# Patient Record
Sex: Male | Born: 1955 | Race: White | Hispanic: No | Smoking: Current every day smoker
Health system: Southern US, Community
[De-identification: ages and names within clinical notes are randomized; demographics above are authoritative.]

## PROBLEM LIST (undated history)

## (undated) DIAGNOSIS — I10 Essential (primary) hypertension: Secondary | ICD-10-CM

## (undated) DIAGNOSIS — M1990 Unspecified osteoarthritis, unspecified site: Secondary | ICD-10-CM

## (undated) DIAGNOSIS — F419 Anxiety disorder, unspecified: Secondary | ICD-10-CM

## (undated) DIAGNOSIS — K458 Other specified abdominal hernia without obstruction or gangrene: Secondary | ICD-10-CM

## (undated) DIAGNOSIS — M47816 Spondylosis without myelopathy or radiculopathy, lumbar region: Secondary | ICD-10-CM

## (undated) DIAGNOSIS — Z87442 Personal history of urinary calculi: Secondary | ICD-10-CM

## (undated) DIAGNOSIS — N400 Enlarged prostate without lower urinary tract symptoms: Secondary | ICD-10-CM

## (undated) HISTORY — PX: BACK SURGERY: SHX140

## (undated) HISTORY — PX: SHOULDER SURGERY: SHX246

## (undated) HISTORY — PX: EYE SURGERY: SHX253

---

## 2002-07-30 ENCOUNTER — Emergency Department (HOSPITAL_COMMUNITY): Admission: EM | Admit: 2002-07-30 | Discharge: 2002-07-31 | Payer: Self-pay

## 2002-08-07 ENCOUNTER — Emergency Department (HOSPITAL_COMMUNITY): Admission: EM | Admit: 2002-08-07 | Discharge: 2002-08-07 | Payer: Self-pay | Admitting: Emergency Medicine

## 2002-08-24 ENCOUNTER — Encounter: Payer: Self-pay | Admitting: Emergency Medicine

## 2002-08-24 ENCOUNTER — Emergency Department (HOSPITAL_COMMUNITY): Admission: EM | Admit: 2002-08-24 | Discharge: 2002-08-24 | Payer: Self-pay | Admitting: Emergency Medicine

## 2002-10-26 ENCOUNTER — Emergency Department (HOSPITAL_COMMUNITY): Admission: EM | Admit: 2002-10-26 | Discharge: 2002-10-26 | Payer: Self-pay | Admitting: Emergency Medicine

## 2003-09-12 ENCOUNTER — Emergency Department (HOSPITAL_COMMUNITY): Admission: EM | Admit: 2003-09-12 | Discharge: 2003-09-12 | Payer: Self-pay | Admitting: Orthopaedic Surgery

## 2005-01-22 ENCOUNTER — Emergency Department (HOSPITAL_COMMUNITY): Admission: EM | Admit: 2005-01-22 | Discharge: 2005-01-22 | Payer: Self-pay | Admitting: Emergency Medicine

## 2007-01-27 ENCOUNTER — Emergency Department (HOSPITAL_COMMUNITY): Admission: EM | Admit: 2007-01-27 | Discharge: 2007-01-27 | Payer: Self-pay | Admitting: Emergency Medicine

## 2007-02-04 ENCOUNTER — Ambulatory Visit (HOSPITAL_COMMUNITY): Admission: RE | Admit: 2007-02-04 | Discharge: 2007-02-04 | Payer: Self-pay | Admitting: Urology

## 2007-02-04 ENCOUNTER — Emergency Department (HOSPITAL_COMMUNITY): Admission: EM | Admit: 2007-02-04 | Discharge: 2007-02-05 | Payer: Self-pay | Admitting: Emergency Medicine

## 2007-02-08 ENCOUNTER — Emergency Department (HOSPITAL_COMMUNITY): Admission: EM | Admit: 2007-02-08 | Discharge: 2007-02-08 | Payer: Self-pay | Admitting: Emergency Medicine

## 2008-03-13 ENCOUNTER — Emergency Department (HOSPITAL_COMMUNITY): Admission: EM | Admit: 2008-03-13 | Discharge: 2008-03-13 | Payer: Self-pay | Admitting: Emergency Medicine

## 2008-04-01 ENCOUNTER — Emergency Department (HOSPITAL_COMMUNITY): Admission: EM | Admit: 2008-04-01 | Discharge: 2008-04-01 | Payer: Self-pay | Admitting: Emergency Medicine

## 2009-02-28 ENCOUNTER — Emergency Department (HOSPITAL_COMMUNITY): Admission: EM | Admit: 2009-02-28 | Discharge: 2009-02-28 | Payer: Self-pay | Admitting: Emergency Medicine

## 2010-04-18 LAB — DIFFERENTIAL
Basophils Absolute: 0.1 K/uL (ref 0.0–0.1)
Basophils Relative: 0 % (ref 0–1)
Eosinophils Absolute: 0 10*3/uL (ref 0.0–0.7)
Eosinophils Absolute: 0.2 10*3/uL (ref 0.0–0.7)
Eosinophils Relative: 0 % (ref 0–5)
Eosinophils Relative: 2 % (ref 0–5)
Lymphocytes Relative: 9 % — ABNORMAL LOW (ref 12–46)
Lymphocytes Relative: 32 % (ref 12–46)
Lymphs Abs: 1.5 10*3/uL (ref 0.7–4.0)
Lymphs Abs: 3.2 10*3/uL (ref 0.7–4.0)
Monocytes Absolute: 1 K/uL (ref 0.1–1.0)
Monocytes Absolute: 1.2 10*3/uL — ABNORMAL HIGH (ref 0.1–1.0)
Monocytes Relative: 6 % (ref 3–12)
Neutro Abs: 14.8 10*3/uL — ABNORMAL HIGH (ref 1.7–7.7)
Neutrophils Relative %: 85 % — ABNORMAL HIGH (ref 43–77)

## 2010-04-18 LAB — BASIC METABOLIC PANEL
CO2: 22 mEq/L (ref 19–32)
Chloride: 105 mEq/L (ref 96–112)
GFR calc Af Amer: >60 mL/min (ref >60)
Glucose, Bld: 109 mg/dL — ABNORMAL HIGH (ref 70–99)
Potassium: 3.9 mEq/L (ref 3.5–5.1)
Sodium: 134 mEq/L — ABNORMAL LOW (ref 135–145)

## 2010-04-18 LAB — URINALYSIS, ROUTINE W REFLEX MICROSCOPIC
Bilirubin Urine: NEGATIVE
Bilirubin Urine: NEGATIVE
Glucose, UA: NEGATIVE mg/dL
Glucose, UA: NEGATIVE mg/dL
Hgb urine dipstick: NEGATIVE
Ketones, ur: NEGATIVE mg/dL
Leukocytes, UA: NEGATIVE
Nitrite: NEGATIVE
Protein, ur: NEGATIVE mg/dL
Specific Gravity, Urine: 1.024 (ref 1.005–1.030)
Specific Gravity, Urine: 1.023 (ref 1.005–1.030)
Urobilinogen, UA: 0.2 mg/dL (ref 0.0–1.0)
Urobilinogen, UA: 1 mg/dL (ref 0.0–1.0)
pH: 5.5 (ref 5.0–8.0)
pH: 6 (ref 5.0–8.0)

## 2010-04-18 LAB — CBC
HCT: 41.7 % (ref 39.0–52.0)
HCT: 40 % (ref 39.0–52.0)
Hemoglobin: 14.3 g/dL (ref 13.0–17.0)
Hemoglobin: 13.6 g/dL (ref 13.0–17.0)
MCHC: 34.3 g/dL (ref 30.0–36.0)
MCV: 90.9 fL (ref 78.0–100.0)
MCV: 92.1 fL (ref 78.0–100.0)
Platelets: 259 10*3/uL (ref 150–400)
Platelets: 269 10*3/uL (ref 150–400)
RBC: 4.58 MIL/uL (ref 4.22–5.81)
RDW: 13.4 % (ref 11.5–15.5)
RDW: 13.1 % (ref 11.5–15.5)
WBC: 17.5 10*3/uL — ABNORMAL HIGH (ref 4.0–10.5)

## 2010-04-18 LAB — POCT I-STAT, CHEM 8
BUN: 9 mg/dL (ref 6–23)
Calcium, Ion: 1.15 mmol/L (ref 1.12–1.32)
Creatinine, Ser: 1 mg/dL (ref 0.4–1.5)
Hemoglobin: 13.9 g/dL (ref 13.0–17.0)
TCO2: 26 mmol/L (ref 0–100)

## 2010-04-18 LAB — URINE MICROSCOPIC-ADD ON

## 2010-04-18 LAB — LIPASE, BLOOD: Lipase: 34 U/L (ref 11–59)

## 2010-04-18 LAB — HEPATIC FUNCTION PANEL
AST: 25 U/L (ref 0–37)
Albumin: 3.8 g/dL (ref 3.5–5.2)
Alkaline Phosphatase: 67 U/L (ref 39–117)
Total Bilirubin: 0.8 mg/dL (ref 0.3–1.2)
Total Protein: 6.5 g/dL (ref 6.0–8.3)

## 2010-04-19 ENCOUNTER — Telehealth: Payer: Self-pay | Admitting: Cardiovascular Disease

## 2010-04-19 NOTE — Telephone Encounter (Signed)
Pt needs his triplopics order he gets it through the patient assistance program

## 2010-04-19 NOTE — Telephone Encounter (Signed)
This message was sent to me on the wrong patient. Please disregard.

## 2010-05-13 ENCOUNTER — Other Ambulatory Visit (HOSPITAL_COMMUNITY): Payer: Self-pay | Admitting: Orthopedic Surgery

## 2010-05-13 DIAGNOSIS — M4300 Spondylolysis, site unspecified: Secondary | ICD-10-CM

## 2010-05-13 DIAGNOSIS — M4807 Spinal stenosis, lumbosacral region: Secondary | ICD-10-CM

## 2010-05-16 ENCOUNTER — Ambulatory Visit (HOSPITAL_COMMUNITY)
Admission: RE | Admit: 2010-05-16 | Discharge: 2010-05-16 | Disposition: A | Discharge status: Home / Self Care | Payer: Medicaid Other | Source: Ambulatory Visit | Attending: Orthopedic Surgery | Admitting: Orthopedic Surgery

## 2010-05-16 DIAGNOSIS — M4807 Spinal stenosis, lumbosacral region: Secondary | ICD-10-CM

## 2010-05-16 DIAGNOSIS — M47817 Spondylosis without myelopathy or radiculopathy, lumbosacral region: Secondary | ICD-10-CM | POA: Insufficient documentation

## 2010-05-16 DIAGNOSIS — M5126 Other intervertebral disc displacement, lumbar region: Secondary | ICD-10-CM | POA: Insufficient documentation

## 2010-05-16 DIAGNOSIS — M51379 Other intervertebral disc degeneration, lumbosacral region without mention of lumbar back pain or lower extremity pain: Secondary | ICD-10-CM | POA: Insufficient documentation

## 2010-05-16 DIAGNOSIS — M519 Unspecified thoracic, thoracolumbar and lumbosacral intervertebral disc disorder: Secondary | ICD-10-CM | POA: Insufficient documentation

## 2010-05-16 DIAGNOSIS — M4300 Spondylolysis, site unspecified: Secondary | ICD-10-CM

## 2010-05-16 DIAGNOSIS — M549 Dorsalgia, unspecified: Secondary | ICD-10-CM | POA: Insufficient documentation

## 2010-05-16 DIAGNOSIS — IMO0002 Reserved for concepts with insufficient information to code with codable children: Secondary | ICD-10-CM | POA: Insufficient documentation

## 2010-05-16 DIAGNOSIS — M5137 Other intervertebral disc degeneration, lumbosacral region: Secondary | ICD-10-CM | POA: Insufficient documentation

## 2010-07-12 ENCOUNTER — Emergency Department (HOSPITAL_COMMUNITY)
Admission: EM | Admit: 2010-07-12 | Discharge: 2010-07-12 | Disposition: A | Discharge status: Home / Self Care | Payer: Medicaid Other | Attending: Emergency Medicine | Admitting: Emergency Medicine

## 2010-07-12 DIAGNOSIS — Z79899 Other long term (current) drug therapy: Secondary | ICD-10-CM | POA: Insufficient documentation

## 2010-07-12 DIAGNOSIS — M4310 Spondylolisthesis, site unspecified: Secondary | ICD-10-CM | POA: Insufficient documentation

## 2010-07-12 DIAGNOSIS — M545 Low back pain, unspecified: Secondary | ICD-10-CM | POA: Insufficient documentation

## 2010-07-22 ENCOUNTER — Ambulatory Visit (HOSPITAL_COMMUNITY)
Admission: RE | Admit: 2010-07-22 | Discharge: 2010-07-22 | Disposition: A | Discharge status: Home / Self Care | Payer: Medicaid Other | Source: Ambulatory Visit | Attending: Neurological Surgery | Admitting: Neurological Surgery

## 2010-07-22 ENCOUNTER — Other Ambulatory Visit (HOSPITAL_COMMUNITY): Payer: Self-pay | Admitting: Neurological Surgery

## 2010-07-22 ENCOUNTER — Encounter (HOSPITAL_COMMUNITY)
Admission: RE | Admit: 2010-07-22 | Discharge: 2010-07-22 | Disposition: A | Discharge status: Home / Self Care | Payer: Medicaid Other | Source: Ambulatory Visit | Attending: Neurological Surgery | Admitting: Neurological Surgery

## 2010-07-22 DIAGNOSIS — I517 Cardiomegaly: Secondary | ICD-10-CM | POA: Insufficient documentation

## 2010-07-22 DIAGNOSIS — Z01812 Encounter for preprocedural laboratory examination: Secondary | ICD-10-CM | POA: Insufficient documentation

## 2010-07-22 DIAGNOSIS — Z01818 Encounter for other preprocedural examination: Secondary | ICD-10-CM

## 2010-07-22 LAB — DIFFERENTIAL
Basophils Absolute: 0.1 10*3/uL (ref 0.0–0.1)
Basophils Relative: 0 % (ref 0–1)
Eosinophils Absolute: 0.3 10*3/uL (ref 0.0–0.7)
Eosinophils Relative: 2 % (ref 0–5)
Monocytes Absolute: 1 10*3/uL (ref 0.1–1.0)
Monocytes Relative: 9 % (ref 3–12)

## 2010-07-22 LAB — SURGICAL PCR SCREEN
MRSA, PCR: NEGATIVE
Staphylococcus aureus: NEGATIVE

## 2010-07-22 LAB — CBC
MCH: 31.6 pg (ref 26.0–34.0)
MCHC: 35.7 g/dL (ref 30.0–36.0)
Platelets: 250 10*3/uL (ref 150–400)
RDW: 12.8 % (ref 11.5–15.5)

## 2010-07-22 LAB — BASIC METABOLIC PANEL
GFR calc Af Amer: >60 mL/min (ref >60)
Potassium: 4.5 mEq/L (ref 3.5–5.1)

## 2010-07-22 LAB — PROTIME-INR: Prothrombin Time: 12.7 seconds (ref 11.6–15.2)

## 2010-07-24 ENCOUNTER — Inpatient Hospital Stay (HOSPITAL_COMMUNITY)
Admission: RE | Admit: 2010-07-24 | Discharge: 2010-07-26 | DRG: 460 | Disposition: A | Discharge status: Home / Self Care | Payer: Medicaid Other | Source: Ambulatory Visit | Attending: Neurological Surgery | Admitting: Neurological Surgery

## 2010-07-24 ENCOUNTER — Inpatient Hospital Stay (HOSPITAL_COMMUNITY): Payer: Medicaid Other

## 2010-07-24 DIAGNOSIS — M47817 Spondylosis without myelopathy or radiculopathy, lumbosacral region: Principal | ICD-10-CM | POA: Diagnosis present

## 2010-07-24 DIAGNOSIS — M5137 Other intervertebral disc degeneration, lumbosacral region: Secondary | ICD-10-CM | POA: Diagnosis present

## 2010-07-24 DIAGNOSIS — I10 Essential (primary) hypertension: Secondary | ICD-10-CM | POA: Diagnosis present

## 2010-07-24 DIAGNOSIS — F17200 Nicotine dependence, unspecified, uncomplicated: Secondary | ICD-10-CM | POA: Diagnosis present

## 2010-07-24 DIAGNOSIS — M51379 Other intervertebral disc degeneration, lumbosacral region without mention of lumbar back pain or lower extremity pain: Secondary | ICD-10-CM | POA: Diagnosis present

## 2010-07-24 LAB — TYPE AND SCREEN: ABO/RH(D): O POS

## 2010-07-24 LAB — ABO/RH: ABO/RH(D): O POS

## 2010-08-02 NOTE — Discharge Summary (Signed)
  NAMERANIER, COACH NO.:  [id number]  MEDICAL RECORD NO.:  [id number]  LOCATION:  3004                         FACILITY:  [facility]  PHYSICIAN:  Tia Alert, MD     DATE OF BIRTH:  1955-07-19  DATE OF ADMISSION:  07/24/2010 DATE OF DISCHARGE:  07/26/2010                              DISCHARGE SUMMARY   ADMISSION DIAGNOSIS:  Lumbar spondylosis with degenerative disk disease and spinal stenosis.  PROCEDURE:  Posterior lumbar interbody fusion, L4-5, L5-S1.  BRIEF HISTORY OF PRESENT ILLNESS:  Samuel Gibson is a 55 year old gentleman who presented with severe back pain, bilateral leg pain.  He had MRI which showed severe spondylosis with degenerative disk disease and stenosis at L4-5 and L5-S1.  I recommended two-level decompression and instrumented fusion in hopes of improving his pain syndrome.  He understood the risks and benefits, expected outcome, and wished to proceed.  HOSPITAL COURSE:  The patient was admitted on July 24, 2010, and taken to the operating room where he underwent a two-level PLIF at L4-5 and L5- S1.  The patient tolerated the procedure well, was taken to the recovery room and then to the floor in stable condition.  For details of the operative procedure, please see the dictated operative note.  The patient's hospital course was routine.  There were no complications.  He had no evidence of significant urinary retention after the first night. He was ambulating well.  He had really minimal pain for this type of procedure.  He had some back soreness, no leg pain.  No numbness, tingling, or weakness.  He had good strength in his lower extremities. He did well with Physical and Occupational Therapy.  His wound remained clean, dry, and intact.  He remained afebrile with stable vital signs and he was discharged home in stable condition on July 26, 2010, and plans to follow up in 2 weeks.  DISCHARGE MEDICATIONS:  He is to continue his home  medications.  He was given prescriptions for Percocet and Flexeril.  Followup is scheduled for 2 weeks.  FINAL DIAGNOSIS:  Posterior lumbar interbody fusion, L4-5, L5-S1.     Tia Alert, MD     DSJ/MEDQ  D:  07/26/2010  T:  07/26/2010  Job:  [id number]  Electronically Signed by Marikay Alar MD on 08/02/2010 11:36:14 AM

## 2010-08-02 NOTE — Op Note (Signed)
NAMEOSRIC, KLOPF NO.:  [id number]  MEDICAL RECORD NO.:  [id number]  LOCATION:  3004                         FACILITY:  [facility]  PHYSICIAN:  Tia Alert, MD     DATE OF BIRTH:  1955/10/29  DATE OF PROCEDURE:  07/24/2010 DATE OF DISCHARGE:                              OPERATIVE REPORT   PREOPERATIVE DIAGNOSES: 1. Degenerative disk disease L4-5, L5-S1. 2. Spinal stenosis L4-5, L5-S1. 3. Back pain. 4. Leg pain.  POSTOPERATIVE DIAGNOSES: 1. Degenerative disk disease L4-5, L5-S1. 2. Spinal stenosis L4-5, L5-S1. 3. Back pain. 4. Leg pain.  PROCEDURES: 1. Decompressive lumbar laminectomy, hemi facetectomy and     foraminotomies at L4-5 and L5-S1 for decompression of the L4, and     the L5, and the S1 nerve roots requiring much more work than is     required of a simple laminectomy for PLIF in order to remove the     stenotic compression of the nerve roots and address his     radiculopathy. 2. Posterior lumbar interbody fusion L5-S1 utilizing PEEK interbody     cages packed with local autograft and Actifuse putty. 3. Intertransverse arthrodesis L4-S1 utilizing Actifuse putty and     local autograft. 4. Segmental fixation L4-S1 utilizing the Globus pedicle screw system.  SURGEON:  Tia Alert, MD  ASSISTANT:  Reinaldo Meeker, MD  ANESTHESIA:  General endotracheal.  COMPLICATIONS:  None apparent.  INDICATIONS FOR PROCEDURE:  Mr. Stairs is a 55 year old gentleman who presented with severe back and leg pain.  He had an MRI which showed significant spondylosis and degenerative disk disease with foraminal stenosis and lateral recess stenosis at L4-5 and L5-S1.  I recommended two-level decompression with instrumented fusion in order to address the spinal stenosis as well as his degenerative disk disease.  He understood the risks, benefits and the expected outcome and wished to proceed.  DESCRIPTION OF PROCEDURE:  The patient was taken to operating  room and after induction of adequate generalized endotracheal anesthesia, he was rolled into the prone position on the chest rolls and all pressure points were padded.  His lumbar region was cleaned with Hibiclens and then prepped with DuraPrep and then draped in usual sterile fashion.  A 10 mL of local anesthesia was injected and a dorsal midline incision was made and carried down to the lumbosacral fascia.  The fascia was opened and the paraspinous musculature was taken down in a subperiosteal fashion to expose L5-5 and L5-S1.  Intraoperative fluoroscopy confirmed my level and I took the dissection out over the facets to expose the transverse processes of L4, L5 and S1.  I used a Kerrison punch and the Leksell rongeur to perform complete laminectomies and hemi facetectomies at L4, L5 and S1.  The underlying yellow ligament was opened and removed.  The L4, the L5, and the S1 nerve roots were identified and decompressed distally into the respective foramina.  This required much more work than is required to expose enough disk for simple PLIF procedure that would not require adequate decompression of the nerve roots and correction of spinal stenosis.  This laminectomy required more work than that  in order to adequately decompress the neural elements to address his spinal stenosis and radicular pain.  Once this was done, I coagulated the epidural venous vasculature, I cut it sharply and incised the disk space.  I incised the disk space bilaterally at L5-S1 and at L4- 5 the exact same diskectomy and disk space preparation was done at both levels, I accepted to use the 8-mm instrument at L5-S1 and 10 mm instrument at L4-5.  I was able to distract the disk space at each level and then used rotating cutters and cutting chisels to prepare the endplates.  The midline was prepared with Epstein curettes and near total diskectomies were completed.  I used PEEK interbody cages, packed with local  autograft and Actifuse putty and tangent interbody bone wedges at each level, and the midline was again packed with local autograft and Actifuse putty.  Once the PLIF was complete, I turned my attention to the segmental fixation.  I localized the pedicle screw entry zones utilizing surface landmarks and lateral fluoroscopy.  I probed each pedicle and the pedicle probe tapped each pedicle with a 5 x 5 tap and placed 6.5 x 50 mm pedicle screws into L4 and L5 and 6.5 x 40 mm pedicle screws into the S1 pedicles bilaterally and I then checked this with AP and lateral fluoroscopy.  I decorticated the transverse processes, placed a mixture of local autograft and Actifuse putty to perform intertransverse arthrodesis.  I then put the lordotic rods into multiaxial screw heads of the pedicle screws and locked these into position with locking caps and antitorque device.  I then irrigated with saline solution containing bacitracin, dried all bleeding points.  He had significant epidural bleeding when I removed the retractor I was able to find this and controlled it, but I spent considerable time drying the surgical bed with bipolar cautery, unipolar cautery, Surgifoam, and Gelfoam.  Once it was completed, I then placed two medium Hemovac drains through separate stab incisions and placed these under the cross-link which was placed for added support.  I then closed the muscle and fascia with 0 Vicryl, closed the subcutaneous and subcuticular tissues with 2-0 and 3-0 Vicryl, and closed the skin with Benzoin Steri-Strips.  The drapes were removed.  Sterile dressing was applied.  The patient awakened from general anesthesia and transferred to recovery room in stable condition.  At the end of procedure, all sponge, needle, and instruments counts were correct.     Tia Alert, MD     DSJ/MEDQ  D:  07/24/2010  T:  07/25/2010  Job:  [id number]  Electronically Signed by Marikay Alar MD on 08/02/2010  11:36:16 AM

## 2010-09-23 ENCOUNTER — Ambulatory Visit
Admission: RE | Admit: 2010-09-23 | Discharge: 2010-09-23 | Disposition: A | Discharge status: Home / Self Care | Payer: Medicaid Other | Source: Ambulatory Visit | Attending: Neurological Surgery | Admitting: Neurological Surgery

## 2010-09-23 ENCOUNTER — Other Ambulatory Visit: Payer: Self-pay | Admitting: Neurological Surgery

## 2010-09-23 DIAGNOSIS — M545 Low back pain: Secondary | ICD-10-CM

## 2010-09-27 LAB — CBC
HCT: 42.1
MCV: 89.4
RBC: 4.71
WBC: 9.9

## 2010-09-27 LAB — URINALYSIS, ROUTINE W REFLEX MICROSCOPIC
Bilirubin Urine: NEGATIVE
Glucose, UA: NEGATIVE
Glucose, UA: NEGATIVE
Ketones, ur: NEGATIVE
Leukocytes, UA: NEGATIVE
Nitrite: NEGATIVE
pH: 5.5
pH: 6.5

## 2010-09-27 LAB — BASIC METABOLIC PANEL
Chloride: 108
GFR calc Af Amer: >60
Potassium: 3.9
Sodium: 139

## 2010-09-27 LAB — URINE MICROSCOPIC-ADD ON

## 2010-10-11 ENCOUNTER — Encounter (INDEPENDENT_AMBULATORY_CARE_PROVIDER_SITE_OTHER): Payer: Medicaid Other | Admitting: Ophthalmology

## 2010-10-11 DIAGNOSIS — H35039 Hypertensive retinopathy, unspecified eye: Secondary | ICD-10-CM

## 2010-10-11 DIAGNOSIS — H43819 Vitreous degeneration, unspecified eye: Secondary | ICD-10-CM

## 2010-10-11 DIAGNOSIS — H2510 Age-related nuclear cataract, unspecified eye: Secondary | ICD-10-CM

## 2011-10-13 ENCOUNTER — Encounter (INDEPENDENT_AMBULATORY_CARE_PROVIDER_SITE_OTHER): Payer: Medicaid Other | Admitting: Ophthalmology

## 2011-12-24 ENCOUNTER — Ambulatory Visit (HOSPITAL_COMMUNITY): Admission: RE | Admit: 2011-12-24 | Payer: Medicaid Other | Source: Ambulatory Visit | Admitting: Gastroenterology

## 2011-12-24 ENCOUNTER — Encounter (HOSPITAL_COMMUNITY): Admission: RE | Payer: Self-pay | Source: Ambulatory Visit

## 2011-12-24 SURGERY — COLONOSCOPY WITH PROPOFOL
Anesthesia: Monitor Anesthesia Care

## 2012-05-15 ENCOUNTER — Emergency Department (HOSPITAL_COMMUNITY): Payer: Medicaid Other

## 2012-05-15 ENCOUNTER — Encounter (HOSPITAL_COMMUNITY): Payer: Self-pay | Admitting: *Deleted

## 2012-05-15 ENCOUNTER — Emergency Department (HOSPITAL_COMMUNITY)
Admission: EM | Admit: 2012-05-15 | Discharge: 2012-05-15 | Disposition: A | Discharge status: Home / Self Care | Payer: Medicaid Other | Attending: Emergency Medicine | Admitting: Emergency Medicine

## 2012-05-15 DIAGNOSIS — W208XXA Other cause of strike by thrown, projected or falling object, initial encounter: Secondary | ICD-10-CM | POA: Insufficient documentation

## 2012-05-15 DIAGNOSIS — I10 Essential (primary) hypertension: Secondary | ICD-10-CM | POA: Insufficient documentation

## 2012-05-15 DIAGNOSIS — Z8719 Personal history of other diseases of the digestive system: Secondary | ICD-10-CM | POA: Insufficient documentation

## 2012-05-15 DIAGNOSIS — S99922A Unspecified injury of left foot, initial encounter: Secondary | ICD-10-CM

## 2012-05-15 DIAGNOSIS — S8990XA Unspecified injury of unspecified lower leg, initial encounter: Secondary | ICD-10-CM | POA: Insufficient documentation

## 2012-05-15 DIAGNOSIS — Z8739 Personal history of other diseases of the musculoskeletal system and connective tissue: Secondary | ICD-10-CM | POA: Insufficient documentation

## 2012-05-15 DIAGNOSIS — F17200 Nicotine dependence, unspecified, uncomplicated: Secondary | ICD-10-CM | POA: Insufficient documentation

## 2012-05-15 DIAGNOSIS — Y929 Unspecified place or not applicable: Secondary | ICD-10-CM | POA: Insufficient documentation

## 2012-05-15 DIAGNOSIS — S99919A Unspecified injury of unspecified ankle, initial encounter: Secondary | ICD-10-CM | POA: Insufficient documentation

## 2012-05-15 DIAGNOSIS — Z87442 Personal history of urinary calculi: Secondary | ICD-10-CM | POA: Insufficient documentation

## 2012-05-15 DIAGNOSIS — Y9389 Activity, other specified: Secondary | ICD-10-CM | POA: Insufficient documentation

## 2012-05-15 HISTORY — DX: Essential (primary) hypertension: I10

## 2012-05-15 HISTORY — DX: Unspecified osteoarthritis, unspecified site: M19.90

## 2012-05-15 HISTORY — DX: Other specified abdominal hernia without obstruction or gangrene: K45.8

## 2012-05-15 HISTORY — DX: Spondylosis without myelopathy or radiculopathy, lumbar region: M47.816

## 2012-05-15 MED ORDER — BACITRACIN-NEOMYCIN-POLYMYXIN 400-5-5000 EX OINT
TOPICAL_OINTMENT | CUTANEOUS | Status: AC
  Filled 2012-05-15: qty 1

## 2012-05-15 NOTE — ED Provider Notes (Signed)
History    This chart was scribed for Samuel Gibson, a non-physician practitioner working with Samuel Racer, MD by Samuel Gibson, ED Scribe. This patient was seen in room WTR7/WTR7 and the patient's care was started at 2043.      CSN: [id number]  Arrival date & time 05/15/12  2018   First MD Initiated Contact with Patient 05/15/12 2028      Chief Complaint  Patient presents with  . Foot Injury    (Consider location/radiation/quality/duration/timing/severity/associated sxs/prior treatment) The history is provided by the patient.   HPI Comments: Samuel Gibson is a 57 y.o. male who presents to the Emergency Department complaining of improving mild left foot pain onset yesterday morning since oil barrel fell on foot. Reports intermittent mild clear drainage. Denies associated fever, other injuries, and abnormal gait. Reports cleaning site with hydrogen peroxide at home. Denies aggravating factors. Reports symptoms are alleviated with home pain medications. Reports tetanus is up to date. Reports takes oxycodone at home for arthritis and chronic pain.   Past Medical History  Diagnosis Date  . Hypertension   . Arthritis   . Degenerative arthritis of lumbar spine   . Kidney calculi   . Hernia of pelvic floor     Past Surgical History  Procedure Laterality Date  . Back surgery      No family history on file.  History  Substance Use Topics  . Smoking status: Current Every Day Smoker -- 0.50 packs/day for 34 years    Types: Cigarettes  . Smokeless tobacco: Never Used  . Alcohol Use: No      Review of Systems A complete 10 system review of systems was obtained and all systems are negative except as noted in the HPI and PMH.     Allergies  Review of patient's allergies indicates no known allergies.  Home Medications  No current outpatient prescriptions on file.  BP 149/91  Pulse 72  Temp(Src) 98.7 F (37.1 C) (Oral)  Resp 18  Ht 5\' 10"  (1.778 m)  Wt  212 lb (96.163 kg)  BMI 30.42 kg/m2  SpO2 100%  Physical Exam  Nursing note and vitals reviewed. Constitutional: He is oriented to person, place, and time. He appears well-developed and well-nourished. No distress.  HENT:  Head: Normocephalic and atraumatic.  Eyes: EOM are normal.  Neck: Neck supple. No tracheal deviation present.  Cardiovascular: Normal rate.   Pulmonary/Chest: Effort normal. No respiratory distress.  Musculoskeletal:       Left foot: He exhibits decreased range of motion (secondary to pain ). He exhibits no tenderness, no bony tenderness, no swelling, normal capillary refill, no deformity and no laceration.       Feet:  No fluctuance, warmth, drainage of left great toe noted.   Abrasion noted on lateral aspect of left greater toe.  Left great toe cap refill <3 seconds  Neurological: He is alert and oriented to person, place, and time. He has normal strength. No sensory deficit.  Skin: Skin is warm and dry.  No active bleeding of left greater toe  Psychiatric: He has a normal mood and affect. His behavior is normal.    ED Course  Procedures (including critical care time) Medications - No data to display  Labs Reviewed - No data to display Dg Foot Complete Left  05/15/2012  *RADIOLOGY REPORT*  Clinical Data: Of fluid.  Pain.  LEFT FOOT - COMPLETE 3+ VIEW  Comparison: None.  Findings: Imaged bones, joints and soft tissues appear  normal.  IMPRESSION: Negative study.   Original Report Authenticated By: Holley Dexter, M.D.      No diagnosis found.    MDM  Patient reports that an oil drum fell on his left foot.  Xray negative.  Patient neurovascularly intact.  Patient able to ambulate.  No signs of foot infection at this time.  Patient stable for discharge.  I personally performed the services described in this documentation, which was scribed in my presence. The recorded information has been reviewed and is accurate.      Samuel Lux [place],  PA-C 05/17/12 1110

## 2012-05-15 NOTE — ED Notes (Signed)
Pt dropped an oil drum on his left great toe Friday morning. Pt had been cleaning wound at home with hydrogen peroxide and noticed it to still be swollen and there was puss present this morning. Pt concerned that the injury maybe more serious then first thought and so comes to the ED. Gait isn't significantly altered r/t injury

## 2012-05-18 NOTE — ED Provider Notes (Signed)
Medical screening examination/treatment/procedure(s) were performed by non-physician practitioner and as supervising physician I was immediately available for consultation/collaboration.   Geroge Gilliam, MD 05/18/12 1543 

## 2012-08-06 ENCOUNTER — Emergency Department (HOSPITAL_COMMUNITY): Payer: Medicaid Other

## 2012-08-06 ENCOUNTER — Encounter (HOSPITAL_COMMUNITY): Payer: Self-pay | Admitting: Emergency Medicine

## 2012-08-06 ENCOUNTER — Emergency Department (HOSPITAL_COMMUNITY)
Admission: EM | Admit: 2012-08-06 | Discharge: 2012-08-06 | Disposition: A | Discharge status: Home / Self Care | Payer: Medicaid Other | Attending: Emergency Medicine | Admitting: Emergency Medicine

## 2012-08-06 DIAGNOSIS — M25519 Pain in unspecified shoulder: Secondary | ICD-10-CM | POA: Insufficient documentation

## 2012-08-06 DIAGNOSIS — N200 Calculus of kidney: Secondary | ICD-10-CM | POA: Insufficient documentation

## 2012-08-06 DIAGNOSIS — Z8719 Personal history of other diseases of the digestive system: Secondary | ICD-10-CM | POA: Insufficient documentation

## 2012-08-06 DIAGNOSIS — F17200 Nicotine dependence, unspecified, uncomplicated: Secondary | ICD-10-CM | POA: Insufficient documentation

## 2012-08-06 DIAGNOSIS — Z8739 Personal history of other diseases of the musculoskeletal system and connective tissue: Secondary | ICD-10-CM | POA: Insufficient documentation

## 2012-08-06 DIAGNOSIS — R109 Unspecified abdominal pain: Secondary | ICD-10-CM | POA: Insufficient documentation

## 2012-08-06 DIAGNOSIS — I10 Essential (primary) hypertension: Secondary | ICD-10-CM | POA: Insufficient documentation

## 2012-08-06 DIAGNOSIS — R632 Polyphagia: Secondary | ICD-10-CM | POA: Insufficient documentation

## 2012-08-06 DIAGNOSIS — Z79899 Other long term (current) drug therapy: Secondary | ICD-10-CM | POA: Insufficient documentation

## 2012-08-06 LAB — COMPREHENSIVE METABOLIC PANEL
ALT: 21 U/L (ref 0–53)
AST: 19 U/L (ref 0–37)
Alkaline Phosphatase: 74 U/L (ref 39–117)
CO2: 27 mEq/L (ref 19–32)
Calcium: 9.4 mg/dL (ref 8.4–10.5)
Chloride: 103 mEq/L (ref 96–112)
GFR calc Af Amer: >90 mL/min (ref >90)
GFR calc non Af Amer: >90 mL/min (ref >90)
Glucose, Bld: 141 mg/dL — ABNORMAL HIGH (ref 70–99)
Sodium: 140 mEq/L (ref 135–145)
Total Bilirubin: 0.2 mg/dL — ABNORMAL LOW (ref 0.3–1.2)

## 2012-08-06 LAB — URINALYSIS, ROUTINE W REFLEX MICROSCOPIC
Bilirubin Urine: NEGATIVE
Hgb urine dipstick: NEGATIVE
Ketones, ur: NEGATIVE mg/dL
Nitrite: NEGATIVE
Protein, ur: NEGATIVE mg/dL
Specific Gravity, Urine: 1.019 (ref 1.005–1.030)
Urobilinogen, UA: 0.2 mg/dL (ref 0.0–1.0)

## 2012-08-06 LAB — CBC WITH DIFFERENTIAL/PLATELET
Eosinophils Relative: 2 % (ref 0–5)
HCT: 43.2 % (ref 39.0–52.0)
Lymphocytes Relative: 21 % (ref 12–46)
Lymphs Abs: 2.8 10*3/uL (ref 0.7–4.0)
MCV: 90.4 fL (ref 78.0–100.0)
Neutro Abs: 9 10*3/uL — ABNORMAL HIGH (ref 1.7–7.7)
Platelets: 295 10*3/uL (ref 150–400)
RBC: 4.78 MIL/uL (ref 4.22–5.81)
WBC: 13.1 10*3/uL — ABNORMAL HIGH (ref 4.0–10.5)

## 2012-08-06 MED ORDER — FENTANYL CITRATE 0.05 MG/ML IJ SOLN
50.0000 ug | INTRAMUSCULAR | Status: DC | PRN
  Administered 2012-08-06 (×2): 50 ug via INTRAVENOUS
  Filled 2012-08-06 (×2): qty 2

## 2012-08-06 MED ORDER — SODIUM CHLORIDE 0.9 % IV BOLUS (SEPSIS)
1000.0000 mL | Freq: Once | INTRAVENOUS | Status: AC
  Administered 2012-08-06: 1000 mL via INTRAVENOUS

## 2012-08-06 MED ORDER — DIAZEPAM 5 MG PO TABS: 5.0000 mg | ORAL_TABLET | Freq: Four times a day (QID) | ORAL | Status: DC | PRN

## 2012-08-06 NOTE — ED Provider Notes (Signed)
CSN: [id number]     Arrival date & time 08/06/12  1508 History     First MD Initiated Contact with Patient 08/06/12 1522     Chief Complaint  Patient presents with  . Flank Pain  . Nephrolithiasis   (Consider location/radiation/quality/duration/timing/severity/associated sxs/prior Treatment) The history is provided by the patient and medical records. No language interpreter was used.    Samuel Gibson is a 57 y.o. male  with a hx of arthritis, kidney calculi and HTN presents to the Emergency Department complaining of gradual, persistent, progressively worsening left flank pain beginning last night. Associated symptoms include feeling hungry all day.  Pt has taken home Rx of oxycodone (usually for back pain) which has not helped the pain.  Pt also states that he received a steroid injection in his L shoulder yesterday.  He reports continued pain in the L shoulder, but endorses full use of the L arm.  Nothing makes it better and sitting makes it worse.  Pt denies heavy lifting, known trauma or new activities that might have given him back problems.  Pt denies fever, chills, headache, neck pain, chest pain, SOB, abd pain, N/V/D, weakness, dizziness, syncope.    Urology: [facility] ([facility]) PCP: Johny [facility])   Past Medical History  Diagnosis Date  . Hypertension   . Arthritis   . Degenerative arthritis of lumbar spine   . Kidney calculi   . Hernia of pelvic floor   . Kidney stones    Past Surgical History  Procedure Laterality Date  . Back surgery     No family history on file. History  Substance Use Topics  . Smoking status: Current Every Day Smoker -- 0.50 packs/day for 34 years    Types: Cigarettes  . Smokeless tobacco: Never Used  . Alcohol Use: No    Review of Systems  Constitutional: Positive for appetite change (more hnugry today). Negative for fever, diaphoresis and fatigue.  Respiratory: Negative for shortness of breath.   Cardiovascular: Negative for chest pain.   Gastrointestinal: Negative for nausea, vomiting, abdominal pain, diarrhea, constipation and blood in stool.  Genitourinary: Positive for flank pain. Negative for dysuria, urgency, frequency, hematuria and difficulty urinating.  Musculoskeletal: Negative for back pain.  Skin: Negative for rash.  Neurological: Negative for headaches.  All other systems reviewed and are negative.    Allergies  Review of patient's allergies indicates no known allergies.  Home Medications   Current Outpatient Rx  Name  Route  Sig  Dispense  Refill  . metoprolol succinate (TOPROL-XL) 50 MG 24 hr tablet   Oral   Take 50 mg by mouth every evening. Take with or immediately following a meal.         . Oxycodone HCl 20 MG TABS   Oral   Take 20 mg by mouth every 4 (four) hours as needed (pain).         . pantoprazole (PROTONIX) 40 MG tablet   Oral   Take 40 mg by mouth every evening.         . diazepam (VALIUM) 5 MG tablet   Oral   Take 1 tablet (5 mg total) by mouth every 6 (six) hours as needed for anxiety (spasms).   10 tablet   0    BP 143/89  Pulse 69  Temp(Src) 98.2 F (36.8 C) (Oral)  Resp 20  SpO2 98% Physical Exam  Nursing note and vitals reviewed. Constitutional: He appears well-developed and well-nourished. No distress.  HENT:  Head: Normocephalic and atraumatic.  Mouth/Throat: Oropharynx is clear and moist. No oropharyngeal exudate.  Cardiovascular: Normal rate, regular rhythm, normal heart sounds and intact distal pulses.   Pulmonary/Chest: Effort normal and breath sounds normal. No respiratory distress. He has no wheezes.  Abdominal: Soft. Bowel sounds are normal. He exhibits no distension and no mass. There is no tenderness. There is CVA tenderness (very mild on the Left). There is no rebound and no guarding.  Musculoskeletal: He exhibits no edema.       Left shoulder: He exhibits decreased range of motion (2/2 pain) and tenderness. He exhibits no bony tenderness, no  swelling, no effusion, no deformity, no laceration, no pain, no spasm and normal pulse.  Neurological: He is alert.  Skin: Skin is warm and dry. No rash noted. He is not diaphoretic.  Psychiatric: He has a normal mood and affect.    ED Course   Procedures (including critical care time)  Labs Reviewed  CBC WITH DIFFERENTIAL - Abnormal; Notable for the following:    WBC 13.1 (*)    Neutro Abs 9.0 (*)    All other components within normal limits  COMPREHENSIVE METABOLIC PANEL - Abnormal; Notable for the following:    Glucose, Bld 141 (*)    Total Bilirubin 0.2 (*)    All other components within normal limits  URINALYSIS, ROUTINE W REFLEX MICROSCOPIC   Ct Abdomen Pelvis Wo Contrast  08/06/2012   *RADIOLOGY REPORT*  Clinical Data: Flank pain with history of kidney stones  CT ABDOMEN AND PELVIS WITHOUT CONTRAST  Technique:  Multidetector CT imaging of the abdomen and pelvis was performed following the standard protocol without intravenous contrast.  Comparison: 09/11/2011  Findings: The lung bases are free of acute infiltrate or sizable effusion.  The gallbladder is decompressed.  The liver, spleen, adrenal glands and pancreas are all normal in their CT appearance.  The kidneys are well visualized bilaterally and reveal no evidence of renal calculi or obstructive changes.  A mildly prominent extrarenal pelvis is again seen on the left stable from prior exam.  The ureters are well visualized within normal limits.  Mild aortic calcifications are seen without aneurysmal dilatation.  The bladder is well distended.  Previously seen bilateral calculi been removed in the interval.  No pelvic mass lesion or side wall abnormality is noted.  Postsurgical changes are noted in the lower lumbar spine.  No acute bony abnormality is seen.  IMPRESSION: No evidence of renal or ureteral calculi.  No acute abnormality is noted.   Original Report Authenticated By: Alcide Clever, M.D.   Dg Chest 2 View  08/06/2012    *RADIOLOGY REPORT*  Clinical Data: Back pain and shortness of breath.  CHEST - 2 VIEW  Comparison: 04/09/2011.  Findings: The cardiac silhouette, mediastinal and hilar contours are normal and stable.  The lungs are clear of acute process.  No pleural effusion.  The bony thorax is intact.  IMPRESSION: No acute cardiopulmonary findings.   Original Report Authenticated By: Rudie Meyer, M.D.   1. Left flank pain     MDM  Trudie Buckler presents with flank pain and hx of kidney stones.  Pt reports that the pain is the same, but he does not have associated symptoms.  Will provide pain control, labs and imaging.    5:00PM CT without evidence of nephrolithiasis.  No other abnormality noted on noncontrast CT.  Will obtain x-ray to r/o pulmonary pathology. I doub PE as pt is not tachycardic, tachypenic  and is PERC negative.  UA pending.    6:00PM UA without evidence of UTI, CBC with mild leukocytosis of 13.1 and CMP unremarkable.  CXR without evidence of pneumonia or pulmonary edema.  I personally reviewed the imaging tests through PACS system.  I reviewed available ER/hospitalization records through the EMR.  Pt pain potentially from thoracic muscle strain vs pain radiating from the L shoulder.  Recommend conservative therapy and PCP follow-up.    I explained the diagnosis and have given explicit precautions to return to the ER including for any other new or worsening symptoms. The patient understands and accepts the medical plan as it's been dictated and I have answered their questions. Discharge instructions concerning home care and prescriptions have been given. The patient is STABLE and is discharged to home in good condition.    Dr. Jerelyn Scott was consulted and agrees with the plan.      Dierdre Forth, PA-C 08/06/12 1838  Dahlia Client Aspynn Clover, PA-C 08/06/12 1839

## 2012-08-06 NOTE — ED Provider Notes (Signed)
Medical screening examination/treatment/procedure(s) were performed by non-physician practitioner and as supervising physician I was immediately available for consultation/collaboration.  Ethelda Chick, MD 08/06/12 [phone]

## 2012-08-06 NOTE — ED Notes (Addendum)
Pt c/o r flank pain x1 day.  Reports hx of kidney stones.  Denies blood in urine.  Denies n/v.

## 2012-10-26 DIAGNOSIS — Z0000 Encounter for general adult medical examination without abnormal findings: Secondary | ICD-10-CM | POA: Diagnosis not present

## 2012-10-26 DIAGNOSIS — E785 Hyperlipidemia, unspecified: Secondary | ICD-10-CM | POA: Diagnosis not present

## 2012-10-26 DIAGNOSIS — I10 Essential (primary) hypertension: Secondary | ICD-10-CM | POA: Diagnosis not present

## 2012-10-26 DIAGNOSIS — Z125 Encounter for screening for malignant neoplasm of prostate: Secondary | ICD-10-CM | POA: Diagnosis not present

## 2012-10-26 DIAGNOSIS — Z1159 Encounter for screening for other viral diseases: Secondary | ICD-10-CM | POA: Diagnosis not present

## 2012-10-26 DIAGNOSIS — M25519 Pain in unspecified shoulder: Secondary | ICD-10-CM | POA: Diagnosis not present

## 2012-11-08 DIAGNOSIS — M25519 Pain in unspecified shoulder: Secondary | ICD-10-CM | POA: Diagnosis not present

## 2012-11-18 DIAGNOSIS — M75120 Complete rotator cuff tear or rupture of unspecified shoulder, not specified as traumatic: Secondary | ICD-10-CM | POA: Diagnosis not present

## 2012-11-18 DIAGNOSIS — M67919 Unspecified disorder of synovium and tendon, unspecified shoulder: Secondary | ICD-10-CM | POA: Diagnosis not present

## 2012-11-18 DIAGNOSIS — M7520 Bicipital tendinitis, unspecified shoulder: Secondary | ICD-10-CM | POA: Diagnosis not present

## 2012-11-18 DIAGNOSIS — M24119 Other articular cartilage disorders, unspecified shoulder: Secondary | ICD-10-CM | POA: Diagnosis not present

## 2012-11-18 DIAGNOSIS — M75110 Incomplete rotator cuff tear or rupture of unspecified shoulder, not specified as traumatic: Secondary | ICD-10-CM | POA: Diagnosis not present

## 2012-11-18 DIAGNOSIS — M19019 Primary osteoarthritis, unspecified shoulder: Secondary | ICD-10-CM | POA: Diagnosis not present

## 2012-11-26 DIAGNOSIS — Z4789 Encounter for other orthopedic aftercare: Secondary | ICD-10-CM | POA: Diagnosis not present

## 2012-12-07 DIAGNOSIS — M19019 Primary osteoarthritis, unspecified shoulder: Secondary | ICD-10-CM | POA: Diagnosis not present

## 2012-12-07 DIAGNOSIS — M25519 Pain in unspecified shoulder: Secondary | ICD-10-CM | POA: Diagnosis not present

## 2012-12-07 DIAGNOSIS — M67919 Unspecified disorder of synovium and tendon, unspecified shoulder: Secondary | ICD-10-CM | POA: Diagnosis not present

## 2012-12-13 DIAGNOSIS — M25519 Pain in unspecified shoulder: Secondary | ICD-10-CM | POA: Diagnosis not present

## 2012-12-13 DIAGNOSIS — M67919 Unspecified disorder of synovium and tendon, unspecified shoulder: Secondary | ICD-10-CM | POA: Diagnosis not present

## 2012-12-13 DIAGNOSIS — M19019 Primary osteoarthritis, unspecified shoulder: Secondary | ICD-10-CM | POA: Diagnosis not present

## 2012-12-15 DIAGNOSIS — M19019 Primary osteoarthritis, unspecified shoulder: Secondary | ICD-10-CM | POA: Diagnosis not present

## 2012-12-15 DIAGNOSIS — M25519 Pain in unspecified shoulder: Secondary | ICD-10-CM | POA: Diagnosis not present

## 2012-12-15 DIAGNOSIS — M67919 Unspecified disorder of synovium and tendon, unspecified shoulder: Secondary | ICD-10-CM | POA: Diagnosis not present

## 2013-03-12 ENCOUNTER — Emergency Department (HOSPITAL_COMMUNITY)
Admission: EM | Admit: 2013-03-12 | Discharge: 2013-03-13 | Disposition: A | Discharge status: Home / Self Care | Payer: Medicare Other | Attending: Emergency Medicine | Admitting: Emergency Medicine

## 2013-03-12 ENCOUNTER — Encounter (HOSPITAL_COMMUNITY): Payer: Self-pay | Admitting: Emergency Medicine

## 2013-03-12 ENCOUNTER — Emergency Department (HOSPITAL_COMMUNITY): Payer: Medicare Other

## 2013-03-12 DIAGNOSIS — Z8719 Personal history of other diseases of the digestive system: Secondary | ICD-10-CM | POA: Insufficient documentation

## 2013-03-12 DIAGNOSIS — F17200 Nicotine dependence, unspecified, uncomplicated: Secondary | ICD-10-CM | POA: Insufficient documentation

## 2013-03-12 DIAGNOSIS — R918 Other nonspecific abnormal finding of lung field: Secondary | ICD-10-CM | POA: Diagnosis not present

## 2013-03-12 DIAGNOSIS — Z8739 Personal history of other diseases of the musculoskeletal system and connective tissue: Secondary | ICD-10-CM | POA: Insufficient documentation

## 2013-03-12 DIAGNOSIS — Z87442 Personal history of urinary calculi: Secondary | ICD-10-CM | POA: Insufficient documentation

## 2013-03-12 DIAGNOSIS — M7989 Other specified soft tissue disorders: Secondary | ICD-10-CM | POA: Diagnosis not present

## 2013-03-12 DIAGNOSIS — I10 Essential (primary) hypertension: Secondary | ICD-10-CM | POA: Insufficient documentation

## 2013-03-12 DIAGNOSIS — Z79899 Other long term (current) drug therapy: Secondary | ICD-10-CM | POA: Insufficient documentation

## 2013-03-12 NOTE — ED Provider Notes (Signed)
CSN: [id number]     Arrival date & time 03/12/13  2147 History   First MD Initiated Contact with Patient 03/12/13 2209     Chief Complaint  Patient presents with  . Leg Swelling     (Consider location/radiation/quality/duration/timing/severity/associated sxs/prior Treatment) HPI Comments: Samuel Gibson is a [age] y.o. male with a past medical history of tobacco use, HTN, Arthritis, Degenerative arthritis of lumbar spine, presenting the Emergency Department with a chief complaint of bilateral lower extremity swelling since yesterday.  The patient reports a gradual onset of bilateral lower extremity today.  He reports swelling to right leg yesterday, relieved by elevating the leg and sleep.  He denies numbness, weakness, or pain to the area. Denies history of trauma. Denies history of MI, or CHF, denies history of stress test.  Reports he was a heavy drinker approximately 8 years ago, and has been sober for 8 years. PCP Johny BlamerHARRIS, WILLIAM, MD   The history is provided by the patient. No language interpreter was used.    Past Medical History  Diagnosis Date  . Hypertension   . Arthritis   . Degenerative arthritis of lumbar spine   . Kidney calculi   . Hernia of pelvic floor   . Kidney stones    Past Surgical History  Procedure Laterality Date  . Back surgery    . Shoulder surgery     No family history on file. History  Substance Use Topics  . Smoking status: Current Every Day Smoker -- 0.50 packs/day for 34 years    Types: Cigarettes  . Smokeless tobacco: Never Used  . Alcohol Use: No    Review of Systems  Constitutional: Negative for fever, chills, diaphoresis, activity change and unexpected weight change.  Respiratory: Negative for cough, chest tightness, shortness of breath and wheezing.   Cardiovascular: Positive for leg swelling. Negative for chest pain and palpitations.  Gastrointestinal: Negative for nausea, vomiting, abdominal pain and diarrhea.  Endocrine: Negative for cold  intolerance and heat intolerance.  Musculoskeletal: Negative for arthralgias and myalgias.  Skin: Negative for wound.  Neurological: Negative for weakness and numbness.      Allergies  Review of patient's allergies indicates no known allergies.  Home Medications   Current Outpatient Rx  Name  Route  Sig  Dispense  Refill  . metoprolol succinate (TOPROL-XL) 50 MG 24 hr tablet   Oral   Take 50 mg by mouth every evening. Take with or immediately following a meal.         . Oxycodone HCl 20 MG TABS   Oral   Take 20 mg by mouth every 4 (four) hours as needed (pain).         . pantoprazole (PROTONIX) 40 MG tablet   Oral   Take 40 mg by mouth every evening.          BP 167/97  Pulse 89  Temp(Src) 98.3 F (36.8 C) (Oral)  Resp 20  SpO2 99% Physical Exam  Nursing note and vitals reviewed. Constitutional: He is oriented to person, place, and time. He appears well-developed and well-nourished. No distress.  HENT:  Head: Normocephalic and atraumatic.  Neck: Neck supple.  Cardiovascular: Normal rate and regular rhythm.   No murmur heard. Pulses:      Radial pulses are 2+ on the right side.       Dorsalis pedis pulses are 2+ on the right side, and 2+ on the left side.  +1 pitting edema to bilateral lower extremities, extending to  2/3 of pretibial.  No erythemia.   Pulmonary/Chest: Effort normal. No respiratory distress. He has no wheezes. He has no rales.  Abdominal: Soft. There is no tenderness. There is no rebound.  Neurological: He is alert and oriented to person, place, and time.  Skin: Skin is warm and dry. No rash noted. He is not diaphoretic. No erythema.  Psychiatric: He has a normal mood and affect. His behavior is normal.    ED Course  Procedures (including critical care time) Labs Review Labs Reviewed  CBC - Abnormal; Notable for the following:    WBC 11.2 (*)    All other components within normal limits  COMPREHENSIVE METABOLIC PANEL - Abnormal; Notable  for the following:    Sodium 135 (*)    Glucose, Bld 108 (*)    Total Bilirubin 0.2 (*)    All other components within normal limits  PRO B NATRIURETIC PEPTIDE  OCCULT BLOOD X 1 CARD TO LAB, STOOL   Imaging Review Dg Chest 2 View  03/13/2013   CLINICAL DATA:  Leg swelling for 1 week, and worsening. No chest complaints. Ex-smoker  EXAM: CHEST  2 VIEW  COMPARISON:  DG CHEST 2 VIEW dated 08/06/2012  FINDINGS: The heart size and mediastinal contours are within normal limits. Both lungs are clear. The visualized skeletal structures are unremarkable. Similar appearance to prior.  IMPRESSION: No active cardiopulmonary disease.   Electronically Signed   By: Davonna Belling M.D.   On: 03/13/2013 00:50     EKG Interpretation   Date/Time:  Saturday March 12 2013 23:27:48 EST Ventricular Rate:  73 PR Interval:  151 QRS Duration: 94 QT Interval:  384 QTC Calculation: 423 R Axis:   80 Text Interpretation:  Sinus rhythm Nonspecific ST abnormality No  significant change since last tracing Confirmed by OPITZ  MD, BRIAN  [phone]) on 03/13/2013 1:01:03 AM      MDM   Final diagnoses:  Swelling of left lower extremity  Swelling of right lower extremity   Pt with bilateral lower extremity swelling for 2 days.  NV intact, good pulses. Doubt PE or cellulitis at this time. Will evaluate for possible heart failure, history of EtOH abuse will evaluate albumin levels. CBC- slight leukocytosis 11.2.  CMP WNL, normal albumin, LFT's WNL. BNP- 59.8, CHF is not the cause of the bilateral swelling. EKG- NSR, nonspecific ST abnormalities, without significant changes from previous tracing.   Discussed patient history, condition, and labs with Dr. Dierdre Highman who agrees the patient can be evaluated as an out-pt. Discussed lab results, imaging results, and treatment plan with the patient. Return precautions given, if SOB, Chest pain, fever or erythema. Reports understanding and no other concerns at this time.  Patient is stable  for discharge at this time. Hemoccult entered in error.     Clabe Seal, PA-C 03/13/13 [phone]

## 2013-03-12 NOTE — ED Notes (Signed)
Patient transported to X-ray 

## 2013-03-12 NOTE — ED Notes (Signed)
Pt c/o bilat feet swelling and pain onset last night, worsening tonight. [facility]

## 2013-03-13 LAB — CBC
HCT: 42 % (ref 39.0–52.0)
Hemoglobin: 14.5 g/dL (ref 13.0–17.0)
MCH: 30.9 pg (ref 26.0–34.0)
MCHC: 34.5 g/dL (ref 30.0–36.0)
MCV: 89.6 fL (ref 78.0–100.0)
Platelets: 253 10*3/uL (ref 150–400)
RBC: 4.69 MIL/uL (ref 4.22–5.81)
RDW: 13.3 % (ref 11.5–15.5)
WBC: 11.2 10*3/uL — ABNORMAL HIGH (ref 4.0–10.5)

## 2013-03-13 LAB — PRO B NATRIURETIC PEPTIDE: Pro B Natriuretic peptide (BNP): 59.8 pg/mL (ref 0–125)

## 2013-03-13 LAB — COMPREHENSIVE METABOLIC PANEL
ALK PHOS: 72 U/L (ref 39–117)
ALT: 52 U/L (ref 0–53)
AST: 31 U/L (ref 0–37)
Albumin: 3.8 g/dL (ref 3.5–5.2)
BILIRUBIN TOTAL: 0.2 mg/dL — AB (ref 0.3–1.2)
BUN: 9 mg/dL (ref 6–23)
CALCIUM: 9.4 mg/dL (ref 8.4–10.5)
CHLORIDE: 98 meq/L (ref 96–112)
CO2: 23 meq/L (ref 19–32)
Creatinine, Ser: 0.88 mg/dL (ref 0.50–1.35)
GLUCOSE: 108 mg/dL — AB (ref 70–99)
Potassium: 3.8 mEq/L (ref 3.7–5.3)
Sodium: 135 mEq/L — ABNORMAL LOW (ref 137–147)
Total Protein: 6.7 g/dL (ref 6.0–8.3)

## 2013-03-13 NOTE — Discharge Instructions (Signed)
Call for a follow up appointment with Dr. Tiburcio PeaHarris for further evaluation of your lower leg swelling. Keep your legs elevated to reduce the swelling.  Return if Symptoms worsen.   Take medication as prescribed.

## 2013-03-13 NOTE — ED Notes (Signed)
Pt. Refused occult blood.

## 2013-03-13 NOTE — ED Provider Notes (Signed)
Medical screening examination/treatment/procedure(s) were performed by non-physician practitioner and as supervising physician I was immediately available for consultation/collaboration.   EKG Interpretation   Date/Time:  Saturday March 12 2013 23:27:48 EST Ventricular Rate:  73 PR Interval:  151 QRS Duration: 94 QT Interval:  384 QTC Calculation: 423 R Axis:   80 Text Interpretation:  Sinus rhythm Nonspecific ST abnormality No  significant change since last tracing Confirmed by OPITZ  MD, BRIAN  [phone]) on 03/13/2013 1:01:03 AM       Shelda JakesScott W. Keniyah Gelinas, MD 03/13/13 2354

## 2013-03-14 DIAGNOSIS — M25519 Pain in unspecified shoulder: Secondary | ICD-10-CM | POA: Diagnosis not present

## 2013-03-14 DIAGNOSIS — I10 Essential (primary) hypertension: Secondary | ICD-10-CM | POA: Diagnosis not present

## 2013-03-14 DIAGNOSIS — R609 Edema, unspecified: Secondary | ICD-10-CM | POA: Diagnosis not present

## 2013-03-16 DIAGNOSIS — H2510 Age-related nuclear cataract, unspecified eye: Secondary | ICD-10-CM | POA: Diagnosis not present

## 2013-04-26 DIAGNOSIS — E785 Hyperlipidemia, unspecified: Secondary | ICD-10-CM | POA: Diagnosis not present

## 2013-04-26 DIAGNOSIS — M549 Dorsalgia, unspecified: Secondary | ICD-10-CM | POA: Diagnosis not present

## 2013-04-26 DIAGNOSIS — I10 Essential (primary) hypertension: Secondary | ICD-10-CM | POA: Diagnosis not present

## 2013-06-25 ENCOUNTER — Encounter (HOSPITAL_COMMUNITY): Payer: Self-pay | Admitting: Emergency Medicine

## 2013-06-25 ENCOUNTER — Emergency Department (HOSPITAL_COMMUNITY)
Admission: EM | Admit: 2013-06-25 | Discharge: 2013-06-25 | Disposition: A | Discharge status: Home / Self Care | Payer: Medicare Other | Attending: Emergency Medicine | Admitting: Emergency Medicine

## 2013-06-25 DIAGNOSIS — M7020 Olecranon bursitis, unspecified elbow: Secondary | ICD-10-CM | POA: Diagnosis not present

## 2013-06-25 DIAGNOSIS — I10 Essential (primary) hypertension: Secondary | ICD-10-CM | POA: Insufficient documentation

## 2013-06-25 DIAGNOSIS — Z791 Long term (current) use of non-steroidal anti-inflammatories (NSAID): Secondary | ICD-10-CM | POA: Diagnosis not present

## 2013-06-25 DIAGNOSIS — IMO0002 Reserved for concepts with insufficient information to code with codable children: Secondary | ICD-10-CM | POA: Diagnosis present

## 2013-06-25 DIAGNOSIS — Z87442 Personal history of urinary calculi: Secondary | ICD-10-CM | POA: Diagnosis not present

## 2013-06-25 DIAGNOSIS — F17200 Nicotine dependence, unspecified, uncomplicated: Secondary | ICD-10-CM | POA: Diagnosis not present

## 2013-06-25 DIAGNOSIS — M47817 Spondylosis without myelopathy or radiculopathy, lumbosacral region: Secondary | ICD-10-CM | POA: Diagnosis not present

## 2013-06-25 DIAGNOSIS — Z8719 Personal history of other diseases of the digestive system: Secondary | ICD-10-CM | POA: Insufficient documentation

## 2013-06-25 DIAGNOSIS — Z79899 Other long term (current) drug therapy: Secondary | ICD-10-CM | POA: Insufficient documentation

## 2013-06-25 DIAGNOSIS — M129 Arthropathy, unspecified: Secondary | ICD-10-CM | POA: Insufficient documentation

## 2013-06-25 DIAGNOSIS — M7021 Olecranon bursitis, right elbow: Secondary | ICD-10-CM

## 2013-06-25 MED ORDER — MELOXICAM 7.5 MG PO TABS: 7.5000 mg | ORAL_TABLET | Freq: Every day | ORAL | Status: DC

## 2013-06-25 NOTE — ED Notes (Signed)
Initial Contact - pt A+Ox4, reports was cleaning in the yard today and noticed a large bump developing on his R elbow x2hrs PTA in ED.  Pt sts "bug bite", but unsure of actually being bitten by a bug.  Golf-ball sized soft swelling noted to elbow.  Not painful, no redness or surrounding swelling noted.  Not hot.  Pt also with red, scabbed areas to R FA, sts "skin asthma" and reports this is his baseline and not a new complaints.  Skin otherwise PWD.  Pt denies all other complaints.  NAD.

## 2013-06-25 NOTE — ED Notes (Signed)
Pt arrived to the Ed with a compliant of an abscess on the right elbow as a result of a bug bite.  Pt states he was picking up tree limbs today anf noticed the raising of an abscess on his right elbow about 2 hours ago.

## 2013-06-25 NOTE — ED Provider Notes (Signed)
CSN: [id number]     Arrival date & time 06/25/13  2219 History   First MD Initiated Contact with Patient 06/25/13 2307     Chief Complaint  Patient presents with  . Abscess   HPI Comments: Patient is a 58 y.o. Male who [facility] with 1 day history of swelling of the right elbow.  Patient denies any pain at this time.  Patient states that he was clearing his yard of branches when he noticed that he had some swelling around the elbow.  He thought that an insect bit him.  He has not tried any relieving factors at this time.  He has also not found any aggravating factors at this time.  Patient denies any fever, chills, nausea, vomiting, IV drug use, new sexual partners, or joint pains.  He states that he is otherwise healthy with the exception of arthritis which he takes oxycodone for.    Patient is a 58 y.o. male presenting with abscess.  Abscess Associated symptoms: no fatigue and no fever     Past Medical History  Diagnosis Date  . Hypertension   . Arthritis   . Degenerative arthritis of lumbar spine   . Kidney calculi   . Hernia of pelvic floor   . Kidney stones    Past Surgical History  Procedure Laterality Date  . Back surgery    . Shoulder surgery     History reviewed. No pertinent family history. History  Substance Use Topics  . Smoking status: Current Every Day Smoker -- 0.50 packs/day for 34 years    Types: Cigarettes  . Smokeless tobacco: Never Used  . Alcohol Use: No    Review of Systems  Constitutional: Negative for fever, chills and fatigue.  Respiratory: Negative for chest tightness and shortness of breath.   Cardiovascular: Negative for chest pain and palpitations.  Musculoskeletal: Positive for arthralgias and joint swelling.  Skin: Negative for color change, pallor, rash and wound.  All other systems reviewed and are negative.     Allergies  Review of patient's allergies indicates no known allergies.  Home Medications   Prior to Admission  medications   Medication Sig Start Date End Date Taking? Authorizing Provider  metoprolol succinate (TOPROL-XL) 50 MG 24 hr tablet Take 50 mg by mouth every evening. Take with or immediately following a meal.   Yes Historical Provider, MD  Oxycodone HCl 20 MG TABS Take 20 mg by mouth every 4 (four) hours as needed (pain).   Yes Historical Provider, MD  pantoprazole (PROTONIX) 40 MG tablet Take 40 mg by mouth every evening.   Yes Historical Provider, MD  meloxicam (MOBIC) 7.5 MG tablet Take 1 tablet (7.5 mg total) by mouth daily. 06/25/13   Roshini Fulwider A Forucci, PA-C   BP 137/83  Pulse 80  Temp(Src) 98.7 F (37.1 C) (Oral)  Resp 16  SpO2 97% Physical Exam  Nursing note and vitals reviewed. Constitutional: He is oriented to person, place, and time. He appears well-developed and well-nourished. No distress.  HENT:  Head: Normocephalic and atraumatic.  Mouth/Throat: Oropharynx is clear and moist. No oropharyngeal exudate.  Eyes: Conjunctivae are normal. No scleral icterus.  Neck: Normal range of motion. Neck supple. No thyromegaly present.  Cardiovascular: Normal rate, regular rhythm, normal heart sounds and intact distal pulses.  Exam reveals no gallop and no friction rub.   No murmur heard. Pulmonary/Chest: Effort normal and breath sounds normal. No respiratory distress. He has no wheezes. He has no rales.  He exhibits no tenderness.  Musculoskeletal: Normal range of motion.  Right elbow has golfball sized swelling over the tip of the olecernon.  There is no erythema, ecchymosis, or joint effusion.  Arm is neurovascularly intact.  Full active range of motion is noted in all directions.  5/5 symmetric strength is noted in all directions.    Lymphadenopathy:    He has no cervical adenopathy.  Neurological: He is alert and oriented to person, place, and time.  Skin: Skin is warm and dry. He is not diaphoretic.  Psychiatric: He has a normal mood and affect. His behavior is normal. Judgment and  thought content normal.    ED Course  Procedures (including critical care time) Labs Review Labs Reviewed - No data to display  Imaging Review No results found.   EKG Interpretation None      MDM   Final diagnoses:  Bursitis of elbow, right   Patient presents to the ED with 1 day of right elbow swelling without pain.  Physical exam and history consistent with olecernon bursitis.  Have instructed the patient to take mobic qdaily with food, use an elbow pad, and ice prn.  He is aware that this is a self limited condition.  He will follow-up with his PCP this week.  He was given strict return precautions of redness, warmth, and fever.  Patient states understanding and agreement with this plan.  I have also counseled the patient on smoking cessation.     Clydie Braunourtney A Forucci, PA-C 06/25/13 2340

## 2013-06-25 NOTE — Discharge Instructions (Signed)
Olecranon Bursitis Bursitis is swelling and soreness (inflammation) of a fluid-filled sac (bursa) that covers and protects a joint. Olecranon bursitis occurs over the elbow.  CAUSES Bursitis can be caused by injury, overuse of the joint, arthritis, or infection.  SYMPTOMS   Tenderness, swelling, warmth, or redness over the elbow.  Elbow pain with movement. This is greater with bending the elbow.  Squeaking sound when the bursa is rubbed or moved.  Increasing size of the bursa without pain or discomfort.  Fever with increasing pain and swelling if the bursa becomes infected. HOME CARE INSTRUCTIONS   Put ice on the affected area.  Put ice in a plastic bag.  Place a towel between your skin and the bag.  Leave the ice on for 15-20 minutes each hour while awake. Do this for the first 2 days.  When resting, elevate your elbow above the level of your heart. This helps reduce swelling.  Continue to put the joint through a full range of motion 4 times per day. Rest the injured joint at other times. When the pain lessens, begin normal slow movements and usual activities.  Only take over-the-counter or prescription medicines for pain, discomfort, or fever as directed by your caregiver.  Reduce your intake of milk and related dairy products (cheese, yogurt). They may make your condition worse. SEEK IMMEDIATE MEDICAL CARE IF:   Your pain increases even during treatment.  You have a fever.  You have heat and inflammation over the bursa and elbow.  You have a red line that goes up your arm.  You have pain with movement of your elbow. MAKE SURE YOU:   Understand these instructions.  Will watch your condition.  Will get help right away if you are not doing well or get worse. Document Released: 01/22/2006 Document Revised: 03/17/2011 Document Reviewed: 12/08/2006 Exit[employer] Patient Information 2015 [employer]. This information is not intended to replace advice given to you by your  health care provider. Make sure you discuss any questions you have with your health care provider.  

## 2013-06-25 NOTE — ED Notes (Signed)
Introduced self to pt,  Comfort measures given,  Pt NAD,  Warm blanket given

## 2013-06-28 NOTE — ED Provider Notes (Signed)
Medical screening examination/treatment/procedure(s) were performed by non-physician practitioner and as supervising physician I was immediately available for consultation/collaboration.   EKG Interpretation None        Kathleen M McManus, DO 06/28/13 1815 

## 2013-07-05 DIAGNOSIS — M7020 Olecranon bursitis, unspecified elbow: Secondary | ICD-10-CM | POA: Diagnosis not present

## 2013-07-05 DIAGNOSIS — K219 Gastro-esophageal reflux disease without esophagitis: Secondary | ICD-10-CM | POA: Diagnosis not present

## 2013-07-05 DIAGNOSIS — I10 Essential (primary) hypertension: Secondary | ICD-10-CM | POA: Diagnosis not present

## 2013-07-05 DIAGNOSIS — M549 Dorsalgia, unspecified: Secondary | ICD-10-CM | POA: Diagnosis not present

## 2013-07-23 ENCOUNTER — Emergency Department (HOSPITAL_COMMUNITY)
Admission: EM | Admit: 2013-07-23 | Discharge: 2013-07-23 | Disposition: A | Discharge status: Home / Self Care | Payer: Medicare Other | Attending: Emergency Medicine | Admitting: Emergency Medicine

## 2013-07-23 ENCOUNTER — Emergency Department (HOSPITAL_COMMUNITY): Payer: Medicare Other

## 2013-07-23 ENCOUNTER — Encounter (HOSPITAL_COMMUNITY): Payer: Self-pay | Admitting: Emergency Medicine

## 2013-07-23 DIAGNOSIS — M79672 Pain in left foot: Secondary | ICD-10-CM

## 2013-07-23 DIAGNOSIS — M25579 Pain in unspecified ankle and joints of unspecified foot: Secondary | ICD-10-CM | POA: Diagnosis not present

## 2013-07-23 DIAGNOSIS — F17200 Nicotine dependence, unspecified, uncomplicated: Secondary | ICD-10-CM | POA: Insufficient documentation

## 2013-07-23 DIAGNOSIS — Z8719 Personal history of other diseases of the digestive system: Secondary | ICD-10-CM | POA: Insufficient documentation

## 2013-07-23 DIAGNOSIS — Z8739 Personal history of other diseases of the musculoskeletal system and connective tissue: Secondary | ICD-10-CM | POA: Diagnosis not present

## 2013-07-23 DIAGNOSIS — I10 Essential (primary) hypertension: Secondary | ICD-10-CM | POA: Diagnosis not present

## 2013-07-23 DIAGNOSIS — M79609 Pain in unspecified limb: Secondary | ICD-10-CM | POA: Insufficient documentation

## 2013-07-23 DIAGNOSIS — S99919A Unspecified injury of unspecified ankle, initial encounter: Secondary | ICD-10-CM | POA: Diagnosis not present

## 2013-07-23 DIAGNOSIS — Z87442 Personal history of urinary calculi: Secondary | ICD-10-CM | POA: Insufficient documentation

## 2013-07-23 DIAGNOSIS — Z79899 Other long term (current) drug therapy: Secondary | ICD-10-CM | POA: Diagnosis not present

## 2013-07-23 DIAGNOSIS — S8990XA Unspecified injury of unspecified lower leg, initial encounter: Secondary | ICD-10-CM | POA: Diagnosis not present

## 2013-07-23 MED ORDER — MELOXICAM 15 MG PO TABS: 15.0000 mg | ORAL_TABLET | Freq: Every day | ORAL | Status: DC

## 2013-07-23 NOTE — ED Notes (Signed)
Pt c/o L heel pain x 2 weeks. Pt denies trauma or injury to foot. Pt ambulatory to exam room with limp. Pt states "I think it's a bone spur."

## 2013-07-23 NOTE — Discharge Instructions (Signed)
Take mobic as needed for pain. Follow up with the recommended foot doctor for further evaluation and management. Refer to attached documents for more information.

## 2013-07-23 NOTE — ED Provider Notes (Signed)
CSN: [id number]     Arrival date & time 07/23/13  1935 History  This chart was scribed for non-physician practitioner, Emilia Beck, PA-C working with Lyanne Co, MD by Greggory Stallion, ED scribe. This patient was seen in room WTR7/WTR7 and the patient's care was started at 7:46 PM.   Chief Complaint  Patient presents with  . Foot Pain   The history is provided by the patient.   HPI Comments: Samuel Gibson is a 58 y.o. male who presents to the Emergency Department complaining of gradual onset left foot pain that started one week ago. States pain is mainly to the heel of his foot. Denies injury. He thinks it might be a bone spur. Denies history of similar pain. Bearing weight worsens the pain. Pt takes oxycodone daily and states it has provided little relief of his pain.   Past Medical History  Diagnosis Date  . Hypertension   . Arthritis   . Degenerative arthritis of lumbar spine   . Kidney calculi   . Hernia of pelvic floor   . Kidney stones    Past Surgical History  Procedure Laterality Date  . Back surgery    . Shoulder surgery     No family history on file. History  Substance Use Topics  . Smoking status: Current Every Day Smoker -- 0.50 packs/day for 34 years    Types: Cigarettes  . Smokeless tobacco: Never Used  . Alcohol Use: No    Review of Systems  Musculoskeletal: Positive for arthralgias.  All other systems reviewed and are negative.  Allergies  Review of patient's allergies indicates no known allergies.  Home Medications   Prior to Admission medications   Medication Sig Start Date End Date Taking? Authorizing Provider  meloxicam (MOBIC) 7.5 MG tablet Take 1 tablet (7.5 mg total) by mouth daily. 06/25/13   Courtney A Forcucci, PA-C  metoprolol succinate (TOPROL-XL) 50 MG 24 hr tablet Take 50 mg by mouth every evening. Take with or immediately following a meal.    Historical Provider, MD  Oxycodone HCl 20 MG TABS Take 20 mg by mouth every 4 (four) hours  as needed (pain).    Historical Provider, MD  pantoprazole (PROTONIX) 40 MG tablet Take 40 mg by mouth every evening.    Historical Provider, MD   There were no vitals taken for this visit.  Physical Exam  Nursing note and vitals reviewed. Constitutional: He is oriented to person, place, and time. He appears well-developed and well-nourished. No distress.  HENT:  Head: Normocephalic and atraumatic.  Eyes: Conjunctivae and EOM are normal.  Neck: Neck supple. No tracheal deviation present.  Cardiovascular: Normal rate.   Pulmonary/Chest: Effort normal. No respiratory distress.  Musculoskeletal: Normal range of motion.  Tenderness to palpation of left heel. No obvious deformity. No edema or wound noted.   Neurological: He is alert and oriented to person, place, and time.  Sensation intact of left foot.  Skin: Skin is warm and dry.  Psychiatric: He has a normal mood and affect. His behavior is normal.    ED Course  Procedures (including critical care time)  DIAGNOSTIC STUDIES: Oxygen Saturation is 97% on RA, normal by my interpretation.    COORDINATION OF CARE: 7:47 PM-Discussed treatment plan which includes xray and pain medication with pt at bedside and pt agreed to plan.   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 Tiny Achilles spur which is not significantly changed. Calcaneal spur as well. No radiopaque foreign object.  IMPRESSION: No acute osseous abnormality.   Electronically Signed   By: Jeronimo GreavesKyle  Talbot M.D.   On: 07/23/2013 20:15     EKG Interpretation None      MDM   Final diagnoses:  Left foot pain    Patient's xray unremarkable for acute changes. Vitals stable and patient afebrile. Patient  will have mobic for pain and instructions to follow up with PCP.   I personally performed the services described in this documentation, which was scribed in my presence. The recorded information has been reviewed and is accurate.  Emilia BeckKaitlyn Iyania Denne, PA-C 07/24/13 1338

## 2013-07-23 NOTE — ED Notes (Signed)
Patient transported to X-ray 

## 2013-07-24 NOTE — ED Provider Notes (Signed)
Medical screening examination/treatment/procedure(s) were performed by non-physician practitioner and as supervising physician I was immediately available for consultation/collaboration.   EKG Interpretation None        Lyanne CoKevin M Amaka Gluth, MD 07/24/13 [phone]

## 2013-09-25 ENCOUNTER — Emergency Department (HOSPITAL_BASED_OUTPATIENT_CLINIC_OR_DEPARTMENT_OTHER)
Admission: EM | Admit: 2013-09-25 | Discharge: 2013-09-25 | Disposition: A | Discharge status: Home / Self Care | Payer: Medicare Other | Attending: Emergency Medicine | Admitting: Emergency Medicine

## 2013-09-25 ENCOUNTER — Encounter (HOSPITAL_BASED_OUTPATIENT_CLINIC_OR_DEPARTMENT_OTHER): Payer: Self-pay | Admitting: Emergency Medicine

## 2013-09-25 DIAGNOSIS — Z87442 Personal history of urinary calculi: Secondary | ICD-10-CM | POA: Insufficient documentation

## 2013-09-25 DIAGNOSIS — M545 Low back pain, unspecified: Secondary | ICD-10-CM

## 2013-09-25 DIAGNOSIS — Z8739 Personal history of other diseases of the musculoskeletal system and connective tissue: Secondary | ICD-10-CM | POA: Insufficient documentation

## 2013-09-25 DIAGNOSIS — Z79899 Other long term (current) drug therapy: Secondary | ICD-10-CM | POA: Diagnosis not present

## 2013-09-25 DIAGNOSIS — I10 Essential (primary) hypertension: Secondary | ICD-10-CM | POA: Diagnosis not present

## 2013-09-25 DIAGNOSIS — F17200 Nicotine dependence, unspecified, uncomplicated: Secondary | ICD-10-CM | POA: Insufficient documentation

## 2013-09-25 MED ORDER — HYDROMORPHONE HCL 1 MG/ML IJ SOLN
2.0000 mg | Freq: Once | INTRAMUSCULAR | Status: AC
  Administered 2013-09-25: 2 mg via INTRAMUSCULAR
  Filled 2013-09-25: qty 2

## 2013-09-25 MED ORDER — NAPROXEN 500 MG PO TABS: 500.0000 mg | ORAL_TABLET | Freq: Two times a day (BID) | ORAL | Status: DC

## 2013-09-25 MED ORDER — CYCLOBENZAPRINE HCL 10 MG PO TABS: 10.0000 mg | ORAL_TABLET | Freq: Two times a day (BID) | ORAL | Status: DC | PRN

## 2013-09-25 NOTE — ED Notes (Signed)
Pt c/o lower back pain which radiates down both legs x 2 days 

## 2013-09-25 NOTE — ED Provider Notes (Signed)
CSN: [id number]     Arrival date & time 09/25/13  4098 History   First MD Initiated Contact with Patient 09/25/13 1006     Chief Complaint  Patient presents with  . Back Pain    Patient is a 59 y.o. male presenting with back pain. The history is provided by the patient.  Back Pain Location:  Lumbar spine Quality:  Aching Radiates to: both legs. Pain severity:  Moderate Onset quality:  Sudden Duration:  2 days (pt woke up with pain) Timing:  Constant Chronicity:  New  patient states this is new episode of pain for him however he has had a history of degenerative arthritis of the lumbar spine. He has had prior back surgeries. Patient is on chronic oxycodone for both shoulder pain as well as back pain. Patient is no longer able to work because of these conditions. He denies any numbness or weakness. He denies any dysuria. He denies any abdominal pain. He has not had any recent injuries and does not recall doing any lifting or anything else that would have triggered this episode.  Past Medical History  Diagnosis Date  . Hypertension   . Arthritis   . Degenerative arthritis of lumbar spine   . Kidney calculi   . Hernia of pelvic floor   . Kidney stones    Past Surgical History  Procedure Laterality Date  . Back surgery    . Shoulder surgery     History reviewed. No pertinent family history. History  Substance Use Topics  . Smoking status: Current Every Day Smoker -- 0.50 packs/day for 34 years    Types: Cigarettes  . Smokeless tobacco: Never Used  . Alcohol Use: No    Review of Systems  Musculoskeletal: Positive for back pain.  All other systems reviewed and are negative.     Allergies  Review of patient's allergies indicates no known allergies.  Home Medications   Prior to Admission medications   Medication Sig Start Date End Date Taking? Authorizing Provider  meloxicam (MOBIC) 15 MG tablet Take 1 tablet (15 mg total) by mouth daily. 07/23/13   Kaitlyn Szekalski,  PA-C  metoprolol succinate (TOPROL-XL) 50 MG 24 hr tablet Take 50 mg by mouth every evening. Take with or immediately following a meal.    Historical Provider, MD  Oxycodone HCl 20 MG TABS Take 20 mg by mouth every 4 (four) hours as needed (pain).    Historical Provider, MD  pantoprazole (PROTONIX) 40 MG tablet Take 40 mg by mouth every evening.    Historical Provider, MD   BP 131/76  Pulse 73  Temp(Src) 97.6 F (36.4 C) (Oral)  Resp 18  Ht  (1.778 m)  Wt 230 lb (104.327 kg)  BMI 33.00 kg/m2  SpO2 100% Physical Exam  Nursing note and vitals reviewed. Constitutional: He appears well-developed and well-nourished. No distress.  HENT:  Head: Normocephalic and atraumatic.  Right Ear: External ear normal.  Left Ear: External ear normal.  Eyes: Conjunctivae are normal. Right eye exhibits no discharge. Left eye exhibits no discharge. No scleral icterus.  Neck: Neck supple. No tracheal deviation present.  Cardiovascular: Normal rate.   Pulmonary/Chest: Effort normal. No stridor. No respiratory distress.  Musculoskeletal: He exhibits no edema.       Lumbar back: He exhibits decreased range of motion, tenderness, bony tenderness and spasm. He exhibits no swelling, no edema and no deformity.       Back:  Neurological: He is alert. Cranial nerve deficit:  no gross deficits.  Skin: Skin is warm and dry. No rash noted.  Psychiatric: He has a normal mood and affect.    ED Course  Procedures (including critical care time) Labs Review Labs Reviewed - No data to display  Imaging Review No results found.   EKG Interpretation None      MDM   Final diagnoses:  Bilateral low back pain without sciatica    No sign of acute neurological or vascular emergency associated with pt's back pain.  Doubt a component of sciatica.  Safe for outpatient follow up.  Continue oxycodone.  Add flexeril and nsaid.  Follow up with pcp    Linwood Dibbles, MD 09/25/13 1048

## 2013-09-25 NOTE — Discharge Instructions (Signed)
Back Pain, Adult Low back pain is very common. About 1 in 5 people have back pain.The cause of low back pain is rarely dangerous. The pain often gets better over time.About half of people with a sudden onset of back pain feel better in just 2 weeks. About 8 in 10 people feel better by 6 weeks.  CAUSES Some common causes of back pain include:  Strain of the muscles or ligaments supporting the spine.  Wear and tear (degeneration) of the spinal discs.  Arthritis.  Direct injury to the back. DIAGNOSIS Most of the time, the direct cause of low back pain is not known.However, back pain can be treated effectively even when the exact cause of the pain is unknown.Answering your caregiver's questions about your overall health and symptoms is one of the most accurate ways to make sure the cause of your pain is not dangerous. If your caregiver needs more information, he or she may order lab work or imaging tests (X-rays or MRIs).However, even if imaging tests show changes in your back, this usually does not require surgery. HOME CARE INSTRUCTIONS For many people, back pain returns.Since low back pain is rarely dangerous, it is often a condition that people can learn to manageon their own.   Remain active. It is stressful on the back to sit or stand in one place. Do not sit, drive, or stand in one place for more than 30 minutes at a time. Take short walks on level surfaces as soon as pain allows.Try to increase the length of time you walk each day.  Do not stay in bed.Resting more than 1 or 2 days can delay your recovery.  Do not avoid exercise or work.Your body is made to move.It is not dangerous to be active, even though your back may hurt.Your back will likely heal faster if you return to being active before your pain is gone.  Pay attention to your body when you bend and lift. Many people have less discomfortwhen lifting if they bend their knees, keep the load close to their bodies,and  avoid twisting. Often, the most comfortable positions are those that put less stress on your recovering back.  Find a comfortable position to sleep. Use a firm mattress and lie on your side with your knees slightly bent. If you lie on your back, put a pillow under your knees.  Only take over-the-counter or prescription medicines as directed by your caregiver. Over-the-counter medicines to reduce pain and inflammation are often the most helpful.Your caregiver may prescribe muscle relaxant drugs.These medicines help dull your pain so you can more quickly return to your normal activities and healthy exercise.  Put ice on the injured area.  Put ice in a plastic bag.  Place a towel between your skin and the bag.  Leave the ice on for 15-20 minutes, 03-04 times a day for the first 2 to 3 days. After that, ice and heat may be alternated to reduce pain and spasms.  Ask your caregiver about trying back exercises and gentle massage. This may be of some benefit.  Avoid feeling anxious or stressed.Stress increases muscle tension and can worsen back pain.It is important to recognize when you are anxious or stressed and learn ways to manage it.Exercise is a great option. SEEK MEDICAL CARE IF:  You have pain that is not relieved with rest or medicine.  You have pain that does not improve in 1 week.  You have new symptoms.  You are generally not feeling well. SEEK   IMMEDIATE MEDICAL CARE IF:   You have pain that radiates from your back into your legs.  You develop new bowel or bladder control problems.  You have unusual weakness or numbness in your arms or legs.  You develop nausea or vomiting.  You develop abdominal pain.  You feel faint. Document Released: 12/23/2004 Document Revised: 06/24/2011 Document Reviewed: 04/26/2013 [employer] Patient Information 2015 [employer]. This information is not intended to replace advice given to you by your health care provider. Make sure you  discuss any questions you have with your health care provider.  

## 2013-09-25 NOTE — ED Notes (Signed)
MD at bedside. 

## 2013-10-26 DIAGNOSIS — Z23 Encounter for immunization: Secondary | ICD-10-CM | POA: Diagnosis not present

## 2013-10-26 DIAGNOSIS — M549 Dorsalgia, unspecified: Secondary | ICD-10-CM | POA: Diagnosis not present

## 2013-10-26 DIAGNOSIS — I10 Essential (primary) hypertension: Secondary | ICD-10-CM | POA: Diagnosis not present

## 2013-10-26 DIAGNOSIS — N5201 Erectile dysfunction due to arterial insufficiency: Secondary | ICD-10-CM | POA: Diagnosis not present

## 2013-10-26 DIAGNOSIS — E782 Mixed hyperlipidemia: Secondary | ICD-10-CM | POA: Diagnosis not present

## 2013-10-26 DIAGNOSIS — F17200 Nicotine dependence, unspecified, uncomplicated: Secondary | ICD-10-CM | POA: Diagnosis not present

## 2013-10-26 DIAGNOSIS — K219 Gastro-esophageal reflux disease without esophagitis: Secondary | ICD-10-CM | POA: Diagnosis not present

## 2013-10-26 DIAGNOSIS — G4700 Insomnia, unspecified: Secondary | ICD-10-CM | POA: Diagnosis not present

## 2013-11-10 DIAGNOSIS — R739 Hyperglycemia, unspecified: Secondary | ICD-10-CM | POA: Diagnosis not present

## 2014-03-27 ENCOUNTER — Encounter (HOSPITAL_COMMUNITY): Payer: Self-pay | Admitting: Emergency Medicine

## 2014-03-27 ENCOUNTER — Emergency Department (HOSPITAL_COMMUNITY)
Admission: EM | Admit: 2014-03-27 | Discharge: 2014-03-27 | Disposition: A | Discharge status: Home / Self Care | Payer: Medicare Other | Attending: Emergency Medicine | Admitting: Emergency Medicine

## 2014-03-27 DIAGNOSIS — I10 Essential (primary) hypertension: Secondary | ICD-10-CM | POA: Diagnosis not present

## 2014-03-27 DIAGNOSIS — Z720 Tobacco use: Secondary | ICD-10-CM | POA: Diagnosis not present

## 2014-03-27 DIAGNOSIS — S46221A Laceration of muscle, fascia and tendon of other parts of biceps, right arm, initial encounter: Secondary | ICD-10-CM | POA: Insufficient documentation

## 2014-03-27 DIAGNOSIS — Z87442 Personal history of urinary calculi: Secondary | ICD-10-CM | POA: Diagnosis not present

## 2014-03-27 DIAGNOSIS — Z791 Long term (current) use of non-steroidal anti-inflammatories (NSAID): Secondary | ICD-10-CM | POA: Insufficient documentation

## 2014-03-27 DIAGNOSIS — Y998 Other external cause status: Secondary | ICD-10-CM | POA: Diagnosis not present

## 2014-03-27 DIAGNOSIS — Y9389 Activity, other specified: Secondary | ICD-10-CM | POA: Diagnosis not present

## 2014-03-27 DIAGNOSIS — Z79899 Other long term (current) drug therapy: Secondary | ICD-10-CM | POA: Insufficient documentation

## 2014-03-27 DIAGNOSIS — M1990 Unspecified osteoarthritis, unspecified site: Secondary | ICD-10-CM | POA: Insufficient documentation

## 2014-03-27 DIAGNOSIS — S46211A Strain of muscle, fascia and tendon of other parts of biceps, right arm, initial encounter: Secondary | ICD-10-CM

## 2014-03-27 DIAGNOSIS — Y9289 Other specified places as the place of occurrence of the external cause: Secondary | ICD-10-CM | POA: Insufficient documentation

## 2014-03-27 DIAGNOSIS — W1839XA Other fall on same level, initial encounter: Secondary | ICD-10-CM | POA: Insufficient documentation

## 2014-03-27 DIAGNOSIS — M79621 Pain in right upper arm: Secondary | ICD-10-CM | POA: Diagnosis present

## 2014-03-27 MED ORDER — OXYCODONE-ACETAMINOPHEN 5-325 MG PO TABS: 1.0000 | ORAL_TABLET | Freq: Four times a day (QID) | ORAL | Status: DC | PRN

## 2014-03-27 MED ORDER — NAPROXEN 500 MG PO TABS: 500.0000 mg | ORAL_TABLET | Freq: Two times a day (BID) | ORAL | Status: DC | PRN

## 2014-03-27 MED ORDER — HYDROCODONE-ACETAMINOPHEN 5-325 MG PO TABS
1.0000 | ORAL_TABLET | Freq: Once | ORAL | Status: AC
  Administered 2014-03-27: 1 via ORAL
  Filled 2014-03-27: qty 1

## 2014-03-27 NOTE — ED Notes (Addendum)
Pt states that he was trying to cath something that was falling over the rail of the porch and the bucket he was standing on tipped over he tried catching himself and hurt his right arm from bicep to finger tips.  Pt cant make a closed fist due to the pain.

## 2014-03-27 NOTE — Discharge Instructions (Signed)
Use immobilizer at all times until you see the orthopedist. Use naprosyn and percocet as directed as needed for pain, but don't drive or operate heavy machinery while taking percocet. Follow up with your orthopedist, or the one listed above, in 3 days for ongoing management of your possible partial biceps tendon tear. Return to the ER for changes or worsening symptoms   Biceps Tendon Disruption (Distal) with Rehab The biceps tendon attaches the biceps muscle to the bones of the elbow and the shoulder. A distal biceps tendon disruption is a tear of this tendon at the end the attached near the elbow. A distal biceps tendon rupture is an uncommon injury. These injuries usually involve a complete tear of the tendon from the bone; however, partial tears are also possible. The bicep muscle works with other muscles to bend the elbow and rotate the palm upward (supinate). A complete biceps rupture will result in approximately a 30% decrease in elbow bending strength and a 40% decrease in one's ability to supinate the wrist. SYMPTOMS   Pain, tenderness, swelling, warmth, or redness at the elbow, usually in the front of the elbow.  Pain that worsens with flexion of the elbow against resistance and when straightening the elbow.  Bulge can be seen and felt in the arm.  Bruising (contusion) in the elbow or forearm after 24 hours.  Limited motion of the elbow.  Weakness with attempted elbow bending (lifting or carrying) or rotation of the wrist (like when using a screwdriver).  A crackling sound (crepitation) when the tendon or elbow is moved or touched. CAUSES  A biceps tendon rupture occurs when the tendon is subjected to a force that is greater than it can withstand, such as straightening the elbow while the biceps is contracted or direct trauma (rare). RISK INCREASES WITH:   Sports that involve contact, as well as throwing sports, gymnastics, weightlifting, and bodybuilding.  Heavy labor.  Poor  strength and flexibility.  Failure to warm-up properly before activity. PREVENTION  Warm up and stretch appropriately before activity.  Maintain physical fitness:  Strength, flexibility, and endurance.  Cardiovascular fitness  Allow your body to recover between practices and competition.  Learn and use proper technique. PROGNOSIS  Surgery is usually required to fix distal biceps tendon rupture. After surgery, a recovery period of 4 to 8 months can be expected to allow for healing and a return to sports.  RELATED COMPLICATIONS  Weakness of elbow bending and forearm rotation, especially if treated non-surgically.  Prolonged disability.  Re-rupture of the tendon after surgery.  Risks of surgery, including infection, bleeding, injury to nerves, elbow or wrist stiffness or loss of motion, and weakness of elbow bending or wrist rotation. TREATMENT  Treatment initially consists of ice and medication to help reduce pain and inflammation. A sling may also be worn to increase one's comfort. Surgery is required for a full recovery and return to sports. Surgery involves reattaching the tendon to the bone. Weakness can be expected if surgery is not performed; however, this may be acceptable for sedentary individuals. Surgery is usually followed by immobilization and rehabilitation exercises to regain strength and a full range of motion.  MEDICATION  If pain medication is necessary, nonsteroidal anti-inflammatory medications, such as aspirin and ibuprofen, or other minor pain relievers, such as acetaminophen, are often recommended.  Do not take pain medication for 7 days before surgery.  Prescription pain relievers may be given if deemed necessary by your caregiver. Use only as directed and only as  much as you need. HEAT AND COLD  Cold treatment (icing) relieves pain and reduces inflammation. Cold treatment should be applied for 10 to 15 minutes every 2 to 3 hours for inflammation and pain  and immediately after any activity that aggravates your symptoms. Use ice packs or an ice massage.  Heat treatment may be used prior to performing the stretching and strengthening activities prescribed by your caregiver, physical therapist, or athletic trainer. Use a heat pack or a warm soak. SEEK MEDICAL CARE IF:   Symptoms get worse or do not improve in 2 weeks despite treatment.  You experience pain, numbness, or coldness in the hand.  Blue, gray, or dark color appears in the fingernails.  Any of the following occur after surgery:  Increased pain, swelling, redness, drainage, or bleeding in the surgical area.  Signs of infection (headache, muscle aches, dizziness, or a general ill feeling with fever).  New, unexplained symptoms develop (drugs used in treatment may produce side effects). EXERCISES RANGE OF MOTION (ROM) AND STRETCHING EXERCISES - Biceps Tendon Disruption (Distal) Once your physician, physical therapist or athletic trainer has permitted you to discontinue using your brace or splint, you may begin to restore your elbow motion by using these exercises. Beginning these exercises before your provider's approval may result in delayed healing. While completing these exercises, remember:   Restoring tissue flexibility helps normal motion to return to the joints. This allows healthier, less painful movement and activity.  An effective stretch should be held for at least 30 seconds.  A stretch should never be painful. You should only feel a gentle lengthening or release in the stretched tissue. RANGE OF MOTION - Extension  Hold your right / left arm at your side and straighten your elbow as far as you can using your right / left arm muscles.  Straighten the right / left elbow farther by gently pushing down on your forearm until you feel a gentle stretch on the inside of your elbow. Hold this position for __________ seconds.  Slowly return to the starting position. Repeat  __________ times. Complete this exercise __________ times per day.  RANGE OF MOTION - Flexion  Hold your right / left arm at your side and bend your elbow as far as you can using your right / left arm muscles.  Bend the right / left elbow farther by gently pushing up on your forearm until you feel a gentle stretch on the outside of your elbow. Hold this position for __________ seconds.  Slowly return to the starting position. Repeat __________ times. Complete this exercise __________ times per day.  RANGE OF MOTION - Supination, Active   Stand or sit with your elbows at your side. Bend your right / left elbow to 90 degrees.  Turn your palm upward until you feel a gentle stretch on the inside of your forearm.  Hold this position for __________ seconds. Slowly release and return to the starting position. Repeat __________ times. Complete this stretch __________ times per day.  RANGE OF MOTION - Pronation, Active   Stand or sit with your elbows at your side. Bend your right / left elbow to 90 degrees.  Turn your palm downward until you feel a gentle stretch on the top of your forearm.  Hold this position for __________ seconds. Slowly release and return to the starting position. Repeat __________ times. Complete this stretch __________ times per day.  STRENGTHENING EXERCISES - Biceps Tendon Disruption (Distal) Once your physician, physical therapist, or athletic trainer  has permitted you to discontinue using your brace or splint, you may begin restoring your arm strength by using these exercises. Beginning these before your provider's approval may result in delayed healing. While completing these exercises, remember:   Muscles can gain both the endurance and the strength needed for everyday activities through controlled exercises.  Complete these exercises as instructed by your physician, physical therapist, or athletic trainer. Progress the resistance and repetitions only as  guided.  You may experience muscle soreness or fatigue, but the pain or discomfort you are trying to eliminate should never worsen during these exercises. If this pain does worsen, stop and make certain you are following the directions exactly. If the pain is still present after adjustments, discontinue the exercise until you can discuss the trouble with your clinician. STRENGTH - Elbow Flexors, Isometric   Stand or sit upright on a firm surface. Place your right / left arm so that your hand is palm-up and at the height of your waist.  Place your opposite hand on top of your forearm. Gently push down as your right / left arm resists. Push as hard as you can with both arms without causing any pain or movement at your right / left elbow. Hold this stationary position for __________ seconds.  Gradually release the tension in both arms. Allow your muscles to relax completely before repeating. Repeat __________ times. Complete this exercise __________ times per day. STRENGTH - Elbow Extensors, Isometric   Stand or sit upright on a firm surface. Place your right / left arm so that your palm faces your abdomen and it is at the height of your waist.  Place your opposite hand on the underside of your forearm. Gently push up as your right / left arm resists. Push as hard as you can with both arms without causing any pain or movement at your right / left elbow. Hold this stationary position for __________ seconds.  Gradually release the tension in both arms. Allow your muscles to relax completely before repeating. Repeat __________ times. Complete this exercise __________ times per day. STRENGTH - Elbow Flexors, Supinated  With good posture, stand or sit on a firm chair without armrests. Allow your right / left arm to rest at your side with your palm facing forward.  Holding a __________ weight or gripping a rubber exercise band/tubing, bring your right / left hand toward your shoulder.  Allow your  muscles to control the resistance as your hand returns to your side. Repeat __________ times. Complete this exercise __________ times per day.  STRENGTH - Elbow Flexors, Neutral  With good posture, stand or sit on a firm chair without armrests. Allow your right / left arm to rest at your side with your thumb facing forward.  Holding a __________ weight or gripping a rubber exercise band/tubing, bring your right / left hand toward your shoulder.  Allow your muscles to control the resistance as your hand returns to your side. Repeat __________ times. Complete this exercise __________ times per day.  STRENGTH - Elbow Extensors  Lie on your back. Extend your right / left elbow into the air, pointing it toward the ceiling. Brace your arm with your opposite hand.*  Holding a __________ weight in your hand, slowly straighten your right / left elbow.  Allow your muscles to control the weight as your hand returns to its starting position. Repeat __________ times. Complete this exercise __________ times per day. *You may also stand with your elbow overhead and pointed toward  the ceiling and supported by your opposite hand. STRENGTH - Elbow Extensors, Dynamic  With good posture, stand or sit on a firm chair without armrests. Keeping your upper arms at your side, bring both hands up to your right / left shoulder while gripping a rubber exercise band/tubing. Your right / left hand should be just below the other hand.  Straighten your right / left elbow. Hold for __________ seconds.  Allow your muscles to control the rubber exercise band/tubing as your hand returns to your shoulder. Repeat __________ times. Complete this exercise __________ times per day. STRENGTH - Forearm Supinators   Sit with your right / left forearm supported on a table, keeping your elbow below shoulder height. Rest your hand over the edge, palm down.  Gently grip a hammer or a soup ladle.  Without moving your elbow, slowly  turn your palm and hand upward to a "thumbs-up" position.  Hold this position for __________ seconds. Slowly return to the starting position. Repeat __________ times. Complete this exercise __________ times per day.  STRENGTH - Forearm Pronators   Sit with your right / left forearm supported on a table, keeping your elbow below shoulder height. Rest your hand over the edge, palm up.  Gently grip a hammer or a soup ladle.  Without moving your elbow, slowly turn your palm and hand upward to a "thumbs-up" position.  Hold this position for __________ seconds. Slowly return to the starting position. Repeat __________ times. Complete this exercise __________ times per day.  Document Released: 12/23/2004 Document Revised: 03/17/2011 Document Reviewed: 04/06/2008 [facility] Patient Information 2015 [place], [place]. This information is not intended to replace advice given to you by your health care provider. Make sure you discuss any questions you have with your health care provider.

## 2014-03-27 NOTE — ED Provider Notes (Signed)
CSN: [id number]     Arrival date & time 03/27/14  7829 History   First MD Initiated Contact with Patient 03/27/14 1011     Chief Complaint  Patient presents with  . Arm Injury    right     (Consider location/radiation/quality/duration/timing/severity/associated sxs/prior Treatment) HPI Comments: Samuel Gibson is a 59 y.o. male with a PMHx of HTN, arthritis, lumbar spine DJD, and nephrolithiasis, who presents to the ED with complaints of right upper arm pain that began yesterday at 6:30 PM when he attempted to catch himself from falling by grabbing the railing next to them. He is experiencing 8/10 constant sharp pain in the biceps region, radiating into the upper forearm and near the shoulder, worse with movement of the arm, and unrelieved with ice and oxycodone. He endorses some decreased range of motion due to pain and some weakness due to pain, although he is still able to move all parts of his extremity. He denies any numbness, tingling, head injury, loss of consciousness, bruises, abrasions. Patient is right-handed. He sees [facility] for orthopedic care.  Patient is a 59 y.o. male presenting with arm injury. The history is provided by the patient. No language interpreter was used.  Arm Injury Location:  Arm Time since incident:  16 hours Injury: yes   Mechanism of injury comment:  Grabbed railing to catch himself from falling Arm location:  R upper arm Pain details:    Quality:  Sharp   Radiates to:  R forearm and R shoulder   Severity:  Severe   Onset quality:  Sudden   Duration:  16 hours   Timing:  Constant   Progression:  Unchanged Chronicity:  New Handedness:  Right-handed Dislocation: no   Prior injury to area:  No Relieved by:  Nothing Worsened by:  Movement Ineffective treatments:  Narcotics and ice Associated symptoms: decreased range of motion (due to pain) and muscle weakness (due to pain)   Associated symptoms: no back pain, no neck pain, no numbness, no  stiffness, no swelling and no tingling     Past Medical History  Diagnosis Date  . Hypertension   . Arthritis   . Degenerative arthritis of lumbar spine   . Kidney calculi   . Hernia of pelvic floor   . Kidney stones    Past Surgical History  Procedure Laterality Date  . Back surgery    . Shoulder surgery     No family history on file. History  Substance Use Topics  . Smoking status: Current Every Day Smoker -- 0.50 packs/day for 34 years    Types: Cigarettes  . Smokeless tobacco: Never Used  . Alcohol Use: No    Review of Systems  Musculoskeletal: Positive for myalgias (R upper arm). Negative for back pain, arthralgias, stiffness and neck pain.  Skin: Negative for color change and wound.  Neurological: Negative for weakness and numbness.  Hematological: Does not bruise/bleed easily.   10 Systems reviewed and are negative for acute change except as noted in the HPI.    Allergies  Review of patient's allergies indicates no known allergies.  Home Medications   Prior to Admission medications   Medication Sig Start Date End Date Taking? Authorizing Provider  cyclobenzaprine (FLEXERIL) 10 MG tablet Take 1 tablet (10 mg total) by mouth 2 (two) times daily as needed for muscle spasms. 09/25/13   Linwood Dibbles, MD  meloxicam (MOBIC) 15 MG tablet Take 1 tablet (15 mg total) by mouth daily. 07/23/13   Yvonna Alanis  Szekalski, PA-C  metoprolol succinate (TOPROL-XL) 50 MG 24 hr tablet Take 50 mg by mouth every evening. Take with or immediately following a meal.    Historical Provider, MD  naproxen (NAPROSYN) 500 MG tablet Take 1 tablet (500 mg total) by mouth 2 (two) times daily. 09/25/13   Linwood Dibbles, MD  Oxycodone HCl 20 MG TABS Take 20 mg by mouth every 4 (four) hours as needed (pain).    Historical Provider, MD  pantoprazole (PROTONIX) 40 MG tablet Take 40 mg by mouth every evening.    Historical Provider, MD   BP 149/84 mmHg  Pulse 77  Temp(Src) 98.1 F (36.7 C) (Oral)  Resp 16  SpO2  98% Physical Exam  Constitutional: He is oriented to person, place, and time. Vital signs are normal. He appears well-developed and well-nourished.  Non-toxic appearance. No distress.  Afebrile, nontoxic, NAD  HENT:  Head: Normocephalic and atraumatic.  Mouth/Throat: Mucous membranes are normal.  Eyes: Conjunctivae and EOM are normal. Right eye exhibits no discharge. Left eye exhibits no discharge.  Neck: Normal range of motion. Neck supple.  Cardiovascular: Normal rate and intact distal pulses.   Pulmonary/Chest: Effort normal. No respiratory distress.  Abdominal: Normal appearance. He exhibits no distension.  Musculoskeletal:       Right elbow: He exhibits decreased range of motion (due to pain in biceps). He exhibits no swelling and no deformity. No tenderness found.       Right upper arm: He exhibits tenderness and deformity (palpable deformity of lateral biceps tendon insertion into elbow). He exhibits no bony tenderness and no swelling.       Arms: R biceps tendon with TTP over lateral aspect near antecubital fossa, with palpable defect noted. Medial insertion still intact without TTP. Elbow with diminished ROM due to pain. Difficulty with supination. No bony TTP, no joint effusion or deformity. Strength diminished due to pain, specifically with elbow flexion, although grip strength intact. Sensation grossly intact. Distal pulses intact, good cap refill.   Neurological: He is alert and oriented to person, place, and time. He has normal strength. No sensory deficit.  Skin: Skin is warm, dry and intact. No rash noted.  Psychiatric: He has a normal mood and affect.  Nursing note and vitals reviewed.   ED Course  Procedures (including critical care time) Labs Review Labs Reviewed - No data to display  Imaging Review No results found.   EKG Interpretation None      MDM   Final diagnoses:  Traumatic partial tear of right biceps tendon, initial encounter    59 y.o. male here  with R biceps pain after trying to catch himself from falling last night. Difficulty with elbow flexion due to pain. Slight palpable defect to the lateral aspect of the biceps tendon, with exquisite TTP. Suspect likely partial biceps tendon tear. Neurovascularly intact with soft compartments. Will place in immobilizer sling and have pt f/up with ortho. Will give pain control here and for home. Discussed proper use of immobilizer. Doubt need for further imaging here today. Pt states he'll call his ortho office ([facility]). I explained the diagnosis and have given explicit precautions to return to the ER including for any other new or worsening symptoms. The patient understands and accepts the medical plan as it's been dictated and I have answered their questions. Discharge instructions concerning home care and prescriptions have been given. The patient is STABLE and is discharged to home in good condition.  BP 149/84 mmHg  Pulse 77  Temp(Src) 98.1 F (36.7 C) (Oral)  Resp 16  SpO2 98%  Meds ordered this encounter  Medications  . HYDROcodone-acetaminophen (NORCO/VICODIN) 5-325 MG per tablet 1 tablet    Sig:   . oxyCODONE-acetaminophen (PERCOCET) 5-325 MG per tablet    Sig: Take 1 tablet by mouth every 6 (six) hours as needed for severe pain.    Dispense:  10 tablet    Refill:  0    Order Specific Question:  Supervising Provider    Answer:  Hyacinth MeekerMILLER, BRIAN [[id number]]  . naproxen (NAPROSYN) 500 MG tablet    Sig: Take 1 tablet (500 mg total) by mouth 2 (two) times daily as needed for mild pain, moderate pain or headache (TAKE WITH MEALS.).    Dispense:  20 tablet    Refill:  0    Order Specific Question:  Supervising Provider    Answer:  Eber HongMILLER, BRIAN [[id number]]       Iwalani Templeton Camprubi-Soms, PA-C 03/27/14 1052  Purvis SheffieldForrest Harrison, MD 03/27/14 2114

## 2014-03-29 ENCOUNTER — Other Ambulatory Visit: Payer: Self-pay | Admitting: Orthopedic Surgery

## 2014-03-29 DIAGNOSIS — M25521 Pain in right elbow: Secondary | ICD-10-CM

## 2014-04-05 DIAGNOSIS — S46211D Strain of muscle, fascia and tendon of other parts of biceps, right arm, subsequent encounter: Secondary | ICD-10-CM | POA: Diagnosis not present

## 2014-04-05 DIAGNOSIS — M542 Cervicalgia: Secondary | ICD-10-CM | POA: Diagnosis not present

## 2014-04-16 ENCOUNTER — Other Ambulatory Visit: Payer: Medicare Other

## 2014-04-27 DIAGNOSIS — F17200 Nicotine dependence, unspecified, uncomplicated: Secondary | ICD-10-CM | POA: Diagnosis not present

## 2014-04-27 DIAGNOSIS — M545 Low back pain: Secondary | ICD-10-CM | POA: Diagnosis not present

## 2014-04-27 DIAGNOSIS — G4700 Insomnia, unspecified: Secondary | ICD-10-CM | POA: Diagnosis not present

## 2014-04-27 DIAGNOSIS — K219 Gastro-esophageal reflux disease without esophagitis: Secondary | ICD-10-CM | POA: Diagnosis not present

## 2014-04-27 DIAGNOSIS — E782 Mixed hyperlipidemia: Secondary | ICD-10-CM | POA: Diagnosis not present

## 2014-04-27 DIAGNOSIS — I10 Essential (primary) hypertension: Secondary | ICD-10-CM | POA: Diagnosis not present

## 2014-04-27 DIAGNOSIS — R7309 Other abnormal glucose: Secondary | ICD-10-CM | POA: Diagnosis not present

## 2014-05-03 DIAGNOSIS — S46211D Strain of muscle, fascia and tendon of other parts of biceps, right arm, subsequent encounter: Secondary | ICD-10-CM | POA: Diagnosis not present

## 2014-05-16 DIAGNOSIS — R7309 Other abnormal glucose: Secondary | ICD-10-CM | POA: Diagnosis not present

## 2014-05-16 DIAGNOSIS — E785 Hyperlipidemia, unspecified: Secondary | ICD-10-CM | POA: Diagnosis not present

## 2014-05-16 DIAGNOSIS — R739 Hyperglycemia, unspecified: Secondary | ICD-10-CM | POA: Diagnosis not present

## 2014-09-27 DIAGNOSIS — H11152 Pinguecula, left eye: Secondary | ICD-10-CM | POA: Diagnosis not present

## 2014-09-27 DIAGNOSIS — H35033 Hypertensive retinopathy, bilateral: Secondary | ICD-10-CM | POA: Diagnosis not present

## 2014-09-27 DIAGNOSIS — H25813 Combined forms of age-related cataract, bilateral: Secondary | ICD-10-CM | POA: Diagnosis not present

## 2014-09-27 DIAGNOSIS — H52223 Regular astigmatism, bilateral: Secondary | ICD-10-CM | POA: Diagnosis not present

## 2014-09-27 DIAGNOSIS — H524 Presbyopia: Secondary | ICD-10-CM | POA: Diagnosis not present

## 2014-09-27 DIAGNOSIS — H04123 Dry eye syndrome of bilateral lacrimal glands: Secondary | ICD-10-CM | POA: Diagnosis not present

## 2014-09-27 DIAGNOSIS — H43812 Vitreous degeneration, left eye: Secondary | ICD-10-CM | POA: Diagnosis not present

## 2014-09-27 DIAGNOSIS — H5213 Myopia, bilateral: Secondary | ICD-10-CM | POA: Diagnosis not present

## 2014-10-15 ENCOUNTER — Emergency Department (HOSPITAL_COMMUNITY): Payer: Medicare Other

## 2014-10-15 ENCOUNTER — Encounter (HOSPITAL_COMMUNITY): Payer: Self-pay | Admitting: Emergency Medicine

## 2014-10-15 ENCOUNTER — Emergency Department (HOSPITAL_COMMUNITY)
Admission: EM | Admit: 2014-10-15 | Discharge: 2014-10-15 | Disposition: A | Discharge status: Home / Self Care | Payer: Medicare Other | Attending: Emergency Medicine | Admitting: Emergency Medicine

## 2014-10-15 DIAGNOSIS — J111 Influenza due to unidentified influenza virus with other respiratory manifestations: Secondary | ICD-10-CM | POA: Diagnosis not present

## 2014-10-15 DIAGNOSIS — R05 Cough: Secondary | ICD-10-CM | POA: Diagnosis not present

## 2014-10-15 DIAGNOSIS — Z720 Tobacco use: Secondary | ICD-10-CM | POA: Insufficient documentation

## 2014-10-15 DIAGNOSIS — R197 Diarrhea, unspecified: Secondary | ICD-10-CM | POA: Diagnosis not present

## 2014-10-15 DIAGNOSIS — J029 Acute pharyngitis, unspecified: Secondary | ICD-10-CM | POA: Diagnosis not present

## 2014-10-15 DIAGNOSIS — R51 Headache: Secondary | ICD-10-CM | POA: Insufficient documentation

## 2014-10-15 DIAGNOSIS — Z8719 Personal history of other diseases of the digestive system: Secondary | ICD-10-CM | POA: Insufficient documentation

## 2014-10-15 DIAGNOSIS — M791 Myalgia: Secondary | ICD-10-CM | POA: Insufficient documentation

## 2014-10-15 DIAGNOSIS — I10 Essential (primary) hypertension: Secondary | ICD-10-CM | POA: Diagnosis not present

## 2014-10-15 DIAGNOSIS — M1990 Unspecified osteoarthritis, unspecified site: Secondary | ICD-10-CM | POA: Insufficient documentation

## 2014-10-15 DIAGNOSIS — F17210 Nicotine dependence, cigarettes, uncomplicated: Secondary | ICD-10-CM | POA: Diagnosis not present

## 2014-10-15 DIAGNOSIS — R509 Fever, unspecified: Secondary | ICD-10-CM | POA: Insufficient documentation

## 2014-10-15 DIAGNOSIS — Z79899 Other long term (current) drug therapy: Secondary | ICD-10-CM | POA: Diagnosis not present

## 2014-10-15 DIAGNOSIS — R0981 Nasal congestion: Secondary | ICD-10-CM | POA: Diagnosis not present

## 2014-10-15 DIAGNOSIS — Z791 Long term (current) use of non-steroidal anti-inflammatories (NSAID): Secondary | ICD-10-CM | POA: Insufficient documentation

## 2014-10-15 DIAGNOSIS — Z87442 Personal history of urinary calculi: Secondary | ICD-10-CM | POA: Insufficient documentation

## 2014-10-15 DIAGNOSIS — R6889 Other general symptoms and signs: Secondary | ICD-10-CM

## 2014-10-15 MED ORDER — IBUPROFEN 800 MG PO TABS: 800.0000 mg | ORAL_TABLET | Freq: Three times a day (TID) | ORAL | Status: DC

## 2014-10-15 MED ORDER — PROMETHAZINE-DM 6.25-15 MG/5ML PO SYRP: 5.0000 mL | ORAL_SOLUTION | Freq: Four times a day (QID) | ORAL | Status: DC | PRN

## 2014-10-15 NOTE — Discharge Instructions (Signed)
Viral Infections °A viral infection can be caused by different types of viruses. Most viral infections are not serious and resolve on their own. However, some infections may cause severe symptoms and may lead to further complications. °SYMPTOMS °Viruses can frequently cause: °· Minor sore throat. °· Aches and pains. °· Headaches. °· Runny nose. °· Different types of rashes. °· Watery eyes. °· Tiredness. °· Cough. °· Loss of appetite. °· Gastrointestinal infections, resulting in nausea, vomiting, and diarrhea. °These symptoms do not respond to antibiotics because the infection is not caused by bacteria. However, you might catch a bacterial infection following the viral infection. This is sometimes called a "superinfection." Symptoms of such a bacterial infection may include: °· Worsening sore throat with pus and difficulty swallowing. °· Swollen neck glands. °· Chills and a high or persistent fever. °· Severe headache. °· Tenderness over the sinuses. °· Persistent overall ill feeling (malaise), muscle aches, and tiredness (fatigue). °· Persistent cough. °· Yellow, green, or brown mucus production with coughing. °HOME CARE INSTRUCTIONS  °· Only take over-the-counter or prescription medicines for pain, discomfort, diarrhea, or fever as directed by your caregiver. °· Drink enough water and fluids to keep your urine clear or pale yellow. Sports drinks can provide valuable electrolytes, sugars, and hydration. °· Get plenty of rest and maintain proper nutrition. Soups and broths with crackers or rice are fine. °SEEK IMMEDIATE MEDICAL CARE IF:  °· You have severe headaches, shortness of breath, chest pain, neck pain, or an unusual rash. °· You have uncontrolled vomiting, diarrhea, or you are unable to keep down fluids. °· You or your child has an oral temperature above 102° F (38.9° C), not controlled by medicine. °· Your baby is older than 3 months with a rectal temperature of 102° F (38.9° C) or higher. °· Your baby is 3  months old or younger with a rectal temperature of 100.4° F (38° C) or higher. °MAKE SURE YOU:  °· Understand these instructions. °· Will watch your condition. °· Will get help right away if you are not doing well or get worse. °  °This information is not intended to replace advice given to you by your health care provider. Make sure you discuss any questions you have with your health care provider. °  °Document Released: 10/02/2004 Document Revised: 03/17/2011 Document Reviewed: 05/31/2014 °Elsevier Interactive Patient Education ©2016 [employer] ° °

## 2014-10-15 NOTE — ED Notes (Signed)
pt c/o flu like symptoms over the past 4 days.  Pt taken Nyquil.

## 2014-10-15 NOTE — ED Provider Notes (Signed)
CSN: [id number]     Arrival date & time 10/15/14  1808 History  By signing my name below, I, Doreatha Martin, attest that this documentation has been prepared under the direction and in the presence of Fayrene Helper, PA-C. Electronically Signed: Doreatha Martin, ED Scribe. 10/15/2014. 7:04 PM.    Chief Complaint  Patient presents with  . flu like symptoms    The history is provided by the patient. No language interpreter was used.   HPI Comments: Samuel Gibson is a 59 y.o. male who presents to the Emergency Department complaining of moderate, unchanging flu-like symptoms onset 4 days ago with associated congestion, hoarseness, generalized myalgias, generalized arthralgias, cough, sore throat, HA, chills, subjective fever, diarrhea (a few times an hour onset today). He has taken Nyquil with no relief. Pt does not work. He reports that he went to a wedding 3 days ago, but otherwise no sick contact. Pt is a current smoker, is trying to quit and has decreased intake to 0.5 ppd. He denies sneezing, otalgia, nausea, vomiting, SOB.   Past Medical History  Diagnosis Date  . Hypertension   . Arthritis   . Degenerative arthritis of lumbar spine   . Kidney calculi   . Hernia of pelvic floor   . Kidney stones    Past Surgical History  Procedure Laterality Date  . Back surgery    . Shoulder surgery     No family history on file. Social History  Substance Use Topics  . Smoking status: Current Every Day Smoker -- 0.50 packs/day for 34 years    Types: Cigarettes  . Smokeless tobacco: Never Used  . Alcohol Use: No    Review of Systems  Constitutional: Positive for fever and chills.  HENT: Positive for congestion and sore throat. Negative for ear pain and sneezing.   Respiratory: Positive for cough. Negative for shortness of breath.   Gastrointestinal: Positive for diarrhea. Negative for nausea and vomiting.  Musculoskeletal: Positive for myalgias and arthralgias.  Neurological: Positive for headaches.    Allergies  Review of patient's allergies indicates no known allergies.  Home Medications   Prior to Admission medications   Medication Sig Start Date End Date Taking? Authorizing Provider  cyclobenzaprine (FLEXERIL) 10 MG tablet Take 1 tablet (10 mg total) by mouth 2 (two) times daily as needed for muscle spasms. 09/25/13   Linwood Dibbles, MD  meloxicam (MOBIC) 15 MG tablet Take 1 tablet (15 mg total) by mouth daily. 07/23/13   Kaitlyn Szekalski, PA-C  metoprolol succinate (TOPROL-XL) 50 MG 24 hr tablet Take 50 mg by mouth every evening. Take with or immediately following a meal.    Historical Provider, MD  naproxen (NAPROSYN) 500 MG tablet Take 1 tablet (500 mg total) by mouth 2 (two) times daily. 09/25/13   Linwood Dibbles, MD  naproxen (NAPROSYN) 500 MG tablet Take 1 tablet (500 mg total) by mouth 2 (two) times daily as needed for mild pain, moderate pain or headache (TAKE WITH MEALS.). 03/27/14   Mercedes Camprubi-Soms, PA-C  Oxycodone HCl 20 MG TABS Take 20 mg by mouth every 4 (four) hours as needed (pain).    Historical Provider, MD  oxyCODONE-acetaminophen (PERCOCET) 5-325 MG per tablet Take 1 tablet by mouth every 6 (six) hours as needed for severe pain. 03/27/14   Mercedes Camprubi-Soms, PA-C  pantoprazole (PROTONIX) 40 MG tablet Take 40 mg by mouth every evening.    Historical Provider, MD   BP 134/87 mmHg  Pulse 90  Temp(Src) 98.2 F (  36.8 C) (Oral)  Resp 19  Ht  (1.778 m)  Wt 220 lb (99.791 kg)  BMI 31.57 kg/m2  SpO2 98% Physical Exam  Constitutional: He is oriented to person, place, and time. He appears well-developed and well-nourished.  HENT:  Head: Normocephalic and atraumatic.  Right Ear: Tympanic membrane normal.  Left Ear: Tympanic membrane normal.  Mouth/Throat: Uvula is midline, oropharynx is clear and moist and mucous membranes are normal.  Boggy turbinates with nasal discharge from the left nare. Uvula midline. No tonsillar enlargement or exudates.   Eyes:  Conjunctivae and EOM are normal. Pupils are equal, round, and reactive to light.  Neck: Normal range of motion. Neck supple.  No nuchal rigidity, no lymphadenopathy.   Cardiovascular: Normal rate, regular rhythm and normal heart sounds.   Pulmonary/Chest: Effort normal. No respiratory distress. He has no wheezes. He has rhonchi. He has no rales.  Scattered rhonchi witthout wheezes or rales.   Abdominal: Soft. He exhibits no distension. There is no tenderness.  Musculoskeletal: Normal range of motion.  Lymphadenopathy:    He has no cervical adenopathy.  Neurological: He is alert and oriented to person, place, and time.  Skin: Skin is warm and dry.  Psychiatric: He has a normal mood and affect. His behavior is normal.  Nursing note and vitals reviewed.  ED Course  Procedures (including critical care time) DIAGNOSTIC STUDIES: Oxygen Saturation is 98% on RA, normal by my interpretation.    COORDINATION OF CARE: 6:56 PM Discussed treatment plan with pt at bedside and pt agreed to plan. Likely to be viral, will XR chest to r/o pneumonia.   8:08 PM Chest x-ray show no acute infiltrate concerning for pneumonia. Patient made aware of finding. Likely viral infection. Symptomatic treatment provided. Patient to follow-up with primary care provider for further care. Return precautions discussed. Patient stable for discharge.  Imaging Review Dg Chest 2 View  10/15/2014   CLINICAL DATA:  Patient with flu-like symptoms 4 days prior. Congestion and productive cough. Generalized myalgias.  EXAM: CHEST  2 VIEW  COMPARISON:  Chest radiograph 03/12/2013  FINDINGS: Stable cardiac and mediastinal contours. No consolidative pulmonary opacities. No pleural effusion or pneumothorax. Regional skeleton is unremarkable.  IMPRESSION: No active cardiopulmonary disease.   Electronically Signed   By: Annia Belt M.D.   On: 10/15/2014 19:48   I have personally reviewed and evaluated these images as part of my medical  decision-making.  MDM   Final diagnoses:  Flu-like symptoms    BP 134/87 mmHg  Pulse 90  Temp(Src) 98.2 F (36.8 C) (Oral)  Resp 19  Ht  (1.778 m)  Wt 220 lb (99.791 kg)  BMI 31.57 kg/m2  SpO2 98%  I, Wavie Hashimi, personally performed the services described in this documentation. All medical record entries made by the scribe were at my direction and in my presence.  I have reviewed the chart and discharge instructions and agree that the record reflects my personal performance and is accurate and complete. Sabrie Moritz.  10/15/2014. 8:09 PM.     Fayrene Helper, PA-C 10/15/14 2009  Alvira Monday, MD 10/18/14 1255

## 2014-10-15 NOTE — ED Notes (Signed)
Pt has now been transported to x-ray

## 2014-10-15 NOTE — ED Notes (Signed)
Pt stated "I had to go to the [facility].  They haven't come for me yet."

## 2014-10-15 NOTE — ED Notes (Signed)
Patient transported to X-ray 

## 2014-10-17 DIAGNOSIS — J069 Acute upper respiratory infection, unspecified: Secondary | ICD-10-CM | POA: Diagnosis not present

## 2014-10-17 DIAGNOSIS — J209 Acute bronchitis, unspecified: Secondary | ICD-10-CM | POA: Diagnosis not present

## 2014-10-27 DIAGNOSIS — G4700 Insomnia, unspecified: Secondary | ICD-10-CM | POA: Diagnosis not present

## 2014-10-27 DIAGNOSIS — I10 Essential (primary) hypertension: Secondary | ICD-10-CM | POA: Diagnosis not present

## 2014-10-27 DIAGNOSIS — Z23 Encounter for immunization: Secondary | ICD-10-CM | POA: Diagnosis not present

## 2014-10-27 DIAGNOSIS — K219 Gastro-esophageal reflux disease without esophagitis: Secondary | ICD-10-CM | POA: Diagnosis not present

## 2014-10-27 DIAGNOSIS — Z125 Encounter for screening for malignant neoplasm of prostate: Secondary | ICD-10-CM | POA: Diagnosis not present

## 2014-10-27 DIAGNOSIS — M545 Low back pain: Secondary | ICD-10-CM | POA: Diagnosis not present

## 2014-10-27 DIAGNOSIS — R739 Hyperglycemia, unspecified: Secondary | ICD-10-CM | POA: Diagnosis not present

## 2014-10-27 DIAGNOSIS — F17200 Nicotine dependence, unspecified, uncomplicated: Secondary | ICD-10-CM | POA: Diagnosis not present

## 2014-10-27 DIAGNOSIS — E782 Mixed hyperlipidemia: Secondary | ICD-10-CM | POA: Diagnosis not present

## 2014-11-02 DIAGNOSIS — H25813 Combined forms of age-related cataract, bilateral: Secondary | ICD-10-CM | POA: Diagnosis not present

## 2014-11-13 DIAGNOSIS — H2512 Age-related nuclear cataract, left eye: Secondary | ICD-10-CM | POA: Diagnosis not present

## 2014-11-22 DIAGNOSIS — H25812 Combined forms of age-related cataract, left eye: Secondary | ICD-10-CM | POA: Diagnosis not present

## 2014-11-22 DIAGNOSIS — H2512 Age-related nuclear cataract, left eye: Secondary | ICD-10-CM | POA: Diagnosis not present

## 2014-11-29 DIAGNOSIS — H2511 Age-related nuclear cataract, right eye: Secondary | ICD-10-CM | POA: Diagnosis not present

## 2014-12-13 DIAGNOSIS — H2511 Age-related nuclear cataract, right eye: Secondary | ICD-10-CM | POA: Diagnosis not present

## 2014-12-13 DIAGNOSIS — H25811 Combined forms of age-related cataract, right eye: Secondary | ICD-10-CM | POA: Diagnosis not present

## 2015-05-08 DIAGNOSIS — R7309 Other abnormal glucose: Secondary | ICD-10-CM | POA: Diagnosis not present

## 2015-05-08 DIAGNOSIS — E782 Mixed hyperlipidemia: Secondary | ICD-10-CM | POA: Diagnosis not present

## 2015-05-08 DIAGNOSIS — I10 Essential (primary) hypertension: Secondary | ICD-10-CM | POA: Diagnosis not present

## 2015-05-08 DIAGNOSIS — M545 Low back pain: Secondary | ICD-10-CM | POA: Diagnosis not present

## 2015-05-08 DIAGNOSIS — G4700 Insomnia, unspecified: Secondary | ICD-10-CM | POA: Diagnosis not present

## 2015-05-08 DIAGNOSIS — K219 Gastro-esophageal reflux disease without esophagitis: Secondary | ICD-10-CM | POA: Diagnosis not present

## 2015-07-05 DIAGNOSIS — H04123 Dry eye syndrome of bilateral lacrimal glands: Secondary | ICD-10-CM | POA: Diagnosis not present

## 2015-07-05 DIAGNOSIS — H524 Presbyopia: Secondary | ICD-10-CM | POA: Diagnosis not present

## 2015-08-19 DIAGNOSIS — W268XXA Contact with other sharp object(s), not elsewhere classified, initial encounter: Secondary | ICD-10-CM | POA: Diagnosis not present

## 2015-08-19 DIAGNOSIS — S61213A Laceration without foreign body of left middle finger without damage to nail, initial encounter: Secondary | ICD-10-CM | POA: Diagnosis not present

## 2015-08-19 DIAGNOSIS — M79645 Pain in left finger(s): Secondary | ICD-10-CM | POA: Diagnosis not present

## 2015-08-19 DIAGNOSIS — M1990 Unspecified osteoarthritis, unspecified site: Secondary | ICD-10-CM | POA: Diagnosis not present

## 2015-08-19 DIAGNOSIS — F17210 Nicotine dependence, cigarettes, uncomplicated: Secondary | ICD-10-CM | POA: Diagnosis not present

## 2015-08-27 DIAGNOSIS — Z4802 Encounter for removal of sutures: Secondary | ICD-10-CM | POA: Diagnosis not present

## 2015-11-09 DIAGNOSIS — F17200 Nicotine dependence, unspecified, uncomplicated: Secondary | ICD-10-CM | POA: Diagnosis not present

## 2015-11-09 DIAGNOSIS — M545 Low back pain: Secondary | ICD-10-CM | POA: Diagnosis not present

## 2015-11-09 DIAGNOSIS — M25512 Pain in left shoulder: Secondary | ICD-10-CM | POA: Diagnosis not present

## 2015-11-09 DIAGNOSIS — R7301 Impaired fasting glucose: Secondary | ICD-10-CM | POA: Diagnosis not present

## 2015-11-09 DIAGNOSIS — G4700 Insomnia, unspecified: Secondary | ICD-10-CM | POA: Diagnosis not present

## 2015-11-09 DIAGNOSIS — Z23 Encounter for immunization: Secondary | ICD-10-CM | POA: Diagnosis not present

## 2015-11-09 DIAGNOSIS — I10 Essential (primary) hypertension: Secondary | ICD-10-CM | POA: Diagnosis not present

## 2015-11-09 DIAGNOSIS — E782 Mixed hyperlipidemia: Secondary | ICD-10-CM | POA: Diagnosis not present

## 2015-11-09 DIAGNOSIS — K219 Gastro-esophageal reflux disease without esophagitis: Secondary | ICD-10-CM | POA: Diagnosis not present

## 2015-11-26 DIAGNOSIS — M25512 Pain in left shoulder: Secondary | ICD-10-CM | POA: Diagnosis not present

## 2015-11-26 DIAGNOSIS — S46211D Strain of muscle, fascia and tendon of other parts of biceps, right arm, subsequent encounter: Secondary | ICD-10-CM | POA: Diagnosis not present

## 2015-11-26 DIAGNOSIS — M542 Cervicalgia: Secondary | ICD-10-CM | POA: Diagnosis not present

## 2015-12-24 ENCOUNTER — Other Ambulatory Visit: Payer: Self-pay | Admitting: Orthopedic Surgery

## 2015-12-24 DIAGNOSIS — M542 Cervicalgia: Secondary | ICD-10-CM | POA: Diagnosis not present

## 2015-12-24 DIAGNOSIS — M25512 Pain in left shoulder: Secondary | ICD-10-CM

## 2015-12-26 ENCOUNTER — Ambulatory Visit
Admission: RE | Admit: 2015-12-26 | Discharge: 2015-12-26 | Disposition: A | Discharge status: Home / Self Care | Payer: Medicare Other | Source: Ambulatory Visit | Attending: Orthopedic Surgery | Admitting: Orthopedic Surgery

## 2015-12-26 ENCOUNTER — Other Ambulatory Visit: Payer: Self-pay | Admitting: Orthopedic Surgery

## 2015-12-26 ENCOUNTER — Ambulatory Visit
Admission: RE | Admit: 2015-12-26 | Discharge: 2015-12-26 | Disposition: A | Discharge status: Home / Self Care | Payer: Self-pay | Source: Ambulatory Visit | Attending: Orthopedic Surgery | Admitting: Orthopedic Surgery

## 2015-12-26 DIAGNOSIS — R52 Pain, unspecified: Secondary | ICD-10-CM

## 2015-12-26 DIAGNOSIS — M25512 Pain in left shoulder: Secondary | ICD-10-CM

## 2015-12-26 DIAGNOSIS — R937 Abnormal findings on diagnostic imaging of other parts of musculoskeletal system: Secondary | ICD-10-CM | POA: Diagnosis not present

## 2016-01-14 DIAGNOSIS — Z9889 Other specified postprocedural states: Secondary | ICD-10-CM

## 2016-01-14 DIAGNOSIS — M19012 Primary osteoarthritis, left shoulder: Secondary | ICD-10-CM | POA: Diagnosis not present

## 2016-01-14 DIAGNOSIS — M549 Dorsalgia, unspecified: Secondary | ICD-10-CM | POA: Diagnosis present

## 2016-01-14 DIAGNOSIS — Z87442 Personal history of urinary calculi: Secondary | ICD-10-CM | POA: Diagnosis present

## 2016-01-14 DIAGNOSIS — I10 Essential (primary) hypertension: Secondary | ICD-10-CM | POA: Diagnosis present

## 2016-01-14 DIAGNOSIS — G8929 Other chronic pain: Secondary | ICD-10-CM | POA: Diagnosis present

## 2016-01-14 NOTE — H&P (Signed)
PREOPERATIVE H&P Patient ID: Samuel Gibson MRN: [id number] DOB/AGE: 03-21-1955 61 y.o.  Chief Complaint: OA LEFT SHOULDER  Planned Procedure Date: 01/29/16 Medical and Cardiac Clearance by Dr. Tiburcio Pea    HPI: Samuel Gibson is a 61 y.o. male with a history of HTN and chronic pain who presents for evaluation of OA LEFT SHOULDER. The patient has a history of pain and functional disability in the left shoulder due to arthritis and has failed non-surgical conservative treatments for greater than 12 weeks to include NSAID's and/or analgesics, corticosteriod injections and activity modification.  Onset of symptoms was gradual, starting 3 years ago with gradually worsening course since that time. The patient noted prior procedures on the shoulder to include Arthroscopy in 2015.  Patient currently rates pain at 8 out of 10 with activity. Patient has night pain, worsening of pain with activity and weight bearing, pain that interferes with activities of daily living, pain with passive range of motion and crepitus. CT and XR show severe OA of the glenohumeral joint. There is no active infection.  Past Medical History:  Diagnosis Date  . Arthritis   . Degenerative arthritis of lumbar spine   . Hernia of pelvic floor   . Hypertension   . Kidney calculi   . Kidney stones    Past Surgical History:  Procedure Laterality Date  . BACK SURGERY    . SHOULDER SURGERY     No Known Allergies   Prior to Admission medications   Medication Sig Start Date End Date Taking? Authorizing Provider  atorvastatin (LIPITOR) 40 MG tablet Take 40 mg by mouth daily at 6 PM.   Yes Historical Provider, MD  ibuprofen (ADVIL,MOTRIN) 800 MG tablet Take 1 tablet (800 mg total) by mouth 3 (three) times daily. 10/15/14  Yes Fayrene Helper, PA-C  metoprolol succinate (TOPROL-XL) 50 MG 24 hr tablet Take 50 mg by mouth daily. Take with or immediately following a meal.   Yes Historical Provider, MD  Oxycodone HCl 20 MG TABS Take 20 mg by mouth  every 6 (six) hours as needed.   Yes Historical Provider, MD  traZODone (DESYREL) 50 MG tablet Take 50 mg by mouth at bedtime.   Yes Historical Provider, MD   Social History: Divorced smoker.  1/2 ppd.  Until recently he smoked 3 ppd x 30+ years.  Family History: Mother: DM, cancer. Sister: DM, HTN, CKD.  ROS: Currently denies lightheadedness, dizziness, Fever, chills, CP, SOB.   No personal history of DVT, PE, MI, or CVA. No loose teeth.  Upper dentures. All other systems have been reviewed and were otherwise currently negative with the exception of those mentioned in the HPI and as above.  Objective: Vitals: Ht: 5'10" Wt: 224 Temp: 98.2 BP: 138/80 Pulse: 67 O2 97% on room air. Physical Exam: General: Alert, NAD.   HEENT: EOMI, Good Neck Extension  Pulm: No increased work of breathing.  Distant sounds, Clear B/L A/P w/o crackle or wheeze.  CV: RRR, No m/g/r appreciated  GI: soft, NT, ND Neuro: Neuro without gross focal deficit.  Sensation intact distally Skin: No lesions in the area of chief complaint MSK/Surgical Site: Left shoulder severe pain w/ ROM.  AROM FF/Ext to 45 degrees.  Passively limited by severe pain to 90 deg.  NVI distally.  Imaging Review CT and XR show severe OA of the glenohumeral joint.  Assessment: OA LEFT SHOULDER Principal Problem:   Primary osteoarthritis, left shoulder Active Problems:   Back pain   History of lumbar  surgery   Essential hypertension   Hx of renal calculi   Chronic pain   Plan: Plan for Procedure(s): TOTAL SHOULDER ARTHROPLASTY - likely reverse TSA,  The patient history, physical exam, clinical judgement of the provider and imaging are consistent with end stage degenerative joint disease and total joint arthroplasty is deemed medically necessary. The treatment options including medical management, injection therapy, and arthroplasty were discussed at length. The risks and benefits of Procedure(s): TOTAL SHOULDER ARTHROPLASTY were  presented and reviewed.  The risks of nonoperative treatment, versus surgical intervention including but not limited to continued pain, aseptic loosening, stiffness, dislocation/subluxation, infection, bleeding, nerve injury, blood clots, cardiopulmonary complications, morbidity, mortality, among others were discussed. The patient verbalizes understanding and wishes to proceed with the plan.  Patient is being admitted for inpatient treatment for surgery, pain control, PT, OT, prophylactic antibiotics, VTE prophylaxis, progressive ambulation, ADL's and discharge planning.   Dental prophylaxis discussed and recommended for 2 years postoperatively.  The patient does meet the criteria for TXA which will be used perioperatively via IV.   ASA 81 mg will be used postoperatively for DVT prophylaxis in addition to SCDs, and early ambulation. The patient is planning to be discharged home in care of his Sister Samuel Gibson who lives near him.  Samuel KernHenry Calvin Martensen III,PA-C 01/14/2016 5:53 PM

## 2016-01-21 ENCOUNTER — Encounter (HOSPITAL_COMMUNITY): Payer: Self-pay

## 2016-01-21 ENCOUNTER — Encounter (HOSPITAL_COMMUNITY)
Admission: RE | Admit: 2016-01-21 | Discharge: 2016-01-21 | Disposition: A | Discharge status: Home / Self Care | Payer: Medicare Other | Source: Ambulatory Visit | Attending: Orthopedic Surgery | Admitting: Orthopedic Surgery

## 2016-01-21 DIAGNOSIS — Z01812 Encounter for preprocedural laboratory examination: Secondary | ICD-10-CM | POA: Insufficient documentation

## 2016-01-21 DIAGNOSIS — Z01810 Encounter for preprocedural cardiovascular examination: Secondary | ICD-10-CM | POA: Diagnosis not present

## 2016-01-21 DIAGNOSIS — I10 Essential (primary) hypertension: Secondary | ICD-10-CM | POA: Insufficient documentation

## 2016-01-21 HISTORY — DX: Anxiety disorder, unspecified: F41.9

## 2016-01-21 LAB — CBC
HEMATOCRIT: 42.4 % (ref 39.0–52.0)
Hemoglobin: 14.8 g/dL (ref 13.0–17.0)
MCH: 31.6 pg (ref 26.0–34.0)
MCHC: 34.9 g/dL (ref 30.0–36.0)
MCV: 90.4 fL (ref 78.0–100.0)
Platelets: 244 10*3/uL (ref 150–400)
RBC: 4.69 MIL/uL (ref 4.22–5.81)
RDW: 12.9 % (ref 11.5–15.5)
WBC: 9.3 10*3/uL (ref 4.0–10.5)

## 2016-01-21 LAB — SURGICAL PCR SCREEN
MRSA, PCR: NEGATIVE
Staphylococcus aureus: NEGATIVE

## 2016-01-21 LAB — BASIC METABOLIC PANEL
Anion gap: 10 (ref 5–15)
BUN: 11 mg/dL (ref 6–20)
CHLORIDE: 106 mmol/L (ref 101–111)
CO2: 21 mmol/L — AB (ref 22–32)
Calcium: 9.4 mg/dL (ref 8.9–10.3)
Creatinine, Ser: 0.91 mg/dL (ref 0.61–1.24)
GFR calc Af Amer: >60 mL/min (ref >60)
GFR calc non Af Amer: >60 mL/min (ref >60)
GLUCOSE: 145 mg/dL — AB (ref 65–99)
POTASSIUM: 4 mmol/L (ref 3.5–5.1)
Sodium: 137 mmol/L (ref 135–145)

## 2016-01-21 NOTE — Pre-Procedure Instructions (Signed)
    Samuel BucklerGary L Rathmann  01/21/2016      [facility] [id number] [place] - [address] [facility] [address] [place] [address] Phone: [phone] Fax: [phone]  [id number] [place] - [id number] [facility] [address] [facility] [address] [place] [address] Phone: [phone] Fax: [phone]    Your procedure is scheduled on 01/29/16.  [facility] at 1245 pm  Call this number if you have problems the morning of surgery:  [phone]   Remember:  Do not eat food or drink liquids after midnight.  Take these medicines the morning of surgery with A SIP OF WATER --metoprolol,oxycodone   Do not wear jewelry, make-up or nail polish.  Do not wear lotions, powders, or perfumes, or deoderant.  Do not shave 48 hours prior to surgery.  Men may shave face and neck.  Do not bring valuables to the hospital.  [facility] is not [facility] for any belongings or valuables.  Contacts, dentures or bridgework may not be worn into surgery.  Leave your suitcase in the car.  After surgery it may be brought to your room.  For patients admitted to the hospital, discharge time will be determined by your treatment team.  Patients discharged the day of surgery will not be allowed to drive home.   Name and phone number of your driver:    Special instructions:  Do not take any aspirin,anti-inflammatories,vitamins,or herbal supplements 5-7 days prior to surgery.  Please read over the following fact sheets that you were given. MRSA Information

## 2016-01-28 MED ORDER — CEFAZOLIN SODIUM-DEXTROSE 2-4 GM/100ML-% IV SOLN
2.0000 g | INTRAVENOUS | Status: AC
  Administered 2016-01-29: 2 g via INTRAVENOUS
  Filled 2016-01-28: qty 100

## 2016-01-28 MED ORDER — TRANEXAMIC ACID 1000 MG/10ML IV SOLN
1000.0000 mg | INTRAVENOUS | Status: AC
  Administered 2016-01-29: 1000 mg via INTRAVENOUS
  Filled 2016-01-28: qty 10

## 2016-01-28 MED ORDER — LACTATED RINGERS IV SOLN
INTRAVENOUS | Status: DC
  Administered 2016-01-29 (×2): via INTRAVENOUS

## 2016-01-28 MED ORDER — GABAPENTIN 300 MG PO CAPS
300.0000 mg | ORAL_CAPSULE | Freq: Once | ORAL | Status: AC
  Administered 2016-01-29: 300 mg via ORAL
  Filled 2016-01-28: qty 1

## 2016-01-28 MED ORDER — ACETAMINOPHEN 500 MG PO TABS
1000.0000 mg | ORAL_TABLET | Freq: Once | ORAL | Status: AC
  Administered 2016-01-29: 975 mg via ORAL

## 2016-01-29 ENCOUNTER — Inpatient Hospital Stay (HOSPITAL_COMMUNITY): Payer: Medicare Other | Admitting: Anesthesiology

## 2016-01-29 ENCOUNTER — Inpatient Hospital Stay (HOSPITAL_COMMUNITY)
Admission: RE | Admit: 2016-01-29 | Discharge: 2016-01-30 | DRG: 483 | Disposition: A | Discharge status: Home / Self Care | Payer: Medicare Other | Source: Ambulatory Visit | Attending: Orthopedic Surgery | Admitting: Orthopedic Surgery

## 2016-01-29 ENCOUNTER — Inpatient Hospital Stay (HOSPITAL_COMMUNITY): Payer: Medicare Other

## 2016-01-29 ENCOUNTER — Encounter (HOSPITAL_COMMUNITY): Payer: Self-pay | Admitting: *Deleted

## 2016-01-29 ENCOUNTER — Encounter (HOSPITAL_COMMUNITY)
Admission: RE | Disposition: A | Discharge status: Home / Self Care | Payer: Self-pay | Source: Ambulatory Visit | Attending: Orthopedic Surgery

## 2016-01-29 DIAGNOSIS — Z833 Family history of diabetes mellitus: Secondary | ICD-10-CM

## 2016-01-29 DIAGNOSIS — Z791 Long term (current) use of non-steroidal anti-inflammatories (NSAID): Secondary | ICD-10-CM

## 2016-01-29 DIAGNOSIS — G8929 Other chronic pain: Secondary | ICD-10-CM | POA: Diagnosis present

## 2016-01-29 DIAGNOSIS — Z87442 Personal history of urinary calculi: Secondary | ICD-10-CM

## 2016-01-29 DIAGNOSIS — M7522 Bicipital tendinitis, left shoulder: Secondary | ICD-10-CM | POA: Diagnosis not present

## 2016-01-29 DIAGNOSIS — I10 Essential (primary) hypertension: Secondary | ICD-10-CM | POA: Diagnosis present

## 2016-01-29 DIAGNOSIS — F17210 Nicotine dependence, cigarettes, uncomplicated: Secondary | ICD-10-CM | POA: Diagnosis present

## 2016-01-29 DIAGNOSIS — Z471 Aftercare following joint replacement surgery: Secondary | ICD-10-CM | POA: Diagnosis not present

## 2016-01-29 DIAGNOSIS — Z96612 Presence of left artificial shoulder joint: Secondary | ICD-10-CM | POA: Diagnosis not present

## 2016-01-29 DIAGNOSIS — M19012 Primary osteoarthritis, left shoulder: Secondary | ICD-10-CM | POA: Diagnosis not present

## 2016-01-29 DIAGNOSIS — Z9889 Other specified postprocedural states: Secondary | ICD-10-CM

## 2016-01-29 DIAGNOSIS — M549 Dorsalgia, unspecified: Secondary | ICD-10-CM | POA: Diagnosis present

## 2016-01-29 DIAGNOSIS — G8918 Other acute postprocedural pain: Secondary | ICD-10-CM | POA: Diagnosis not present

## 2016-01-29 DIAGNOSIS — F419 Anxiety disorder, unspecified: Secondary | ICD-10-CM | POA: Diagnosis not present

## 2016-01-29 HISTORY — DX: Personal history of urinary calculi: Z87.442

## 2016-01-29 HISTORY — PX: TOTAL SHOULDER ARTHROPLASTY: SHX126

## 2016-01-29 LAB — CREATININE, SERUM
CREATININE: 0.98 mg/dL (ref 0.61–1.24)
GFR calc Af Amer: >60 mL/min (ref >60)

## 2016-01-29 LAB — CBC
HCT: 40.7 % (ref 39.0–52.0)
HEMOGLOBIN: 13.9 g/dL (ref 13.0–17.0)
MCH: 31.2 pg (ref 26.0–34.0)
MCHC: 34.2 g/dL (ref 30.0–36.0)
MCV: 91.5 fL (ref 78.0–100.0)
Platelets: 235 10*3/uL (ref 150–400)
RBC: 4.45 MIL/uL (ref 4.22–5.81)
RDW: 12.8 % (ref 11.5–15.5)
WBC: 20.8 10*3/uL — ABNORMAL HIGH (ref 4.0–10.5)

## 2016-01-29 SURGERY — ARTHROPLASTY, SHOULDER, TOTAL
Anesthesia: General | Laterality: Left

## 2016-01-29 MED ORDER — ONDANSETRON HCL 4 MG/2ML IJ SOLN
INTRAMUSCULAR | Status: AC
  Filled 2016-01-29: qty 2

## 2016-01-29 MED ORDER — SORBITOL 70 % SOLN: 30.0000 mL | Freq: Every day | Status: DC | PRN

## 2016-01-29 MED ORDER — PHENYLEPHRINE HCL 10 MG/ML IJ SOLN
INTRAMUSCULAR | Status: DC | PRN
  Administered 2016-01-29: 200 ug via INTRAVENOUS

## 2016-01-29 MED ORDER — METOPROLOL SUCCINATE ER 50 MG PO TB24
50.0000 mg | ORAL_TABLET | Freq: Every day | ORAL | Status: DC
  Administered 2016-01-29: 50 mg via ORAL
  Filled 2016-01-29: qty 1

## 2016-01-29 MED ORDER — ONDANSETRON HCL 4 MG/2ML IJ SOLN: 4.0000 mg | Freq: Four times a day (QID) | INTRAMUSCULAR | Status: DC | PRN

## 2016-01-29 MED ORDER — MAGNESIUM CITRATE PO SOLN: 1.0000 | Freq: Once | ORAL | Status: DC | PRN

## 2016-01-29 MED ORDER — ONDANSETRON HCL 4 MG PO TABS: 4.0000 mg | ORAL_TABLET | Freq: Three times a day (TID) | ORAL | 0 refills | Status: DC | PRN

## 2016-01-29 MED ORDER — HYDROMORPHONE HCL 1 MG/ML IJ SOLN
INTRAMUSCULAR | Status: AC
  Administered 2016-01-29: 0.5 mg via INTRAVENOUS
  Filled 2016-01-29: qty 0.5

## 2016-01-29 MED ORDER — SUCCINYLCHOLINE CHLORIDE 20 MG/ML IJ SOLN
INTRAMUSCULAR | Status: DC | PRN
  Administered 2016-01-29: 100 mg via INTRAVENOUS

## 2016-01-29 MED ORDER — ACETAMINOPHEN 325 MG PO TABS: 650.0000 mg | ORAL_TABLET | Freq: Four times a day (QID) | ORAL | Status: DC | PRN

## 2016-01-29 MED ORDER — PHENYLEPHRINE HCL 10 MG/ML IJ SOLN
INTRAVENOUS | Status: DC | PRN
  Administered 2016-01-29: 50 ug/min via INTRAVENOUS

## 2016-01-29 MED ORDER — FENTANYL CITRATE (PF) 100 MCG/2ML IJ SOLN
INTRAMUSCULAR | Status: DC | PRN
  Administered 2016-01-29 (×2): 50 ug via INTRAVENOUS

## 2016-01-29 MED ORDER — MIDAZOLAM HCL 2 MG/2ML IJ SOLN
2.0000 mg | Freq: Once | INTRAMUSCULAR | Status: AC
  Administered 2016-01-29: 2 mg via INTRAVENOUS

## 2016-01-29 MED ORDER — ONDANSETRON HCL 4 MG PO TABS: 4.0000 mg | ORAL_TABLET | Freq: Four times a day (QID) | ORAL | Status: DC | PRN

## 2016-01-29 MED ORDER — SUGAMMADEX SODIUM 200 MG/2ML IV SOLN
INTRAVENOUS | Status: AC
  Filled 2016-01-29: qty 2

## 2016-01-29 MED ORDER — METHOCARBAMOL 500 MG PO TABS: 500.0000 mg | ORAL_TABLET | Freq: Four times a day (QID) | ORAL | 0 refills | Status: DC | PRN

## 2016-01-29 MED ORDER — ARTIFICIAL TEARS OP OINT
TOPICAL_OINTMENT | OPHTHALMIC | Status: DC | PRN
  Administered 2016-01-29: 1 via OPHTHALMIC

## 2016-01-29 MED ORDER — CEFAZOLIN SODIUM-DEXTROSE 2-4 GM/100ML-% IV SOLN
2.0000 g | Freq: Four times a day (QID) | INTRAVENOUS | Status: DC
  Administered 2016-01-29 – 2016-01-30 (×2): 2 g via INTRAVENOUS
  Filled 2016-01-29 (×4): qty 100

## 2016-01-29 MED ORDER — SENNA 8.6 MG PO TABS
1.0000 | ORAL_TABLET | Freq: Two times a day (BID) | ORAL | Status: DC
  Administered 2016-01-29 – 2016-01-30 (×2): 8.6 mg via ORAL
  Filled 2016-01-29 (×2): qty 1

## 2016-01-29 MED ORDER — OXYCODONE-ACETAMINOPHEN 5-325 MG PO TABS: 1.0000 | ORAL_TABLET | ORAL | 0 refills | Status: DC | PRN

## 2016-01-29 MED ORDER — MIDAZOLAM HCL 2 MG/2ML IJ SOLN
INTRAMUSCULAR | Status: DC | PRN
  Administered 2016-01-29 (×2): 1 mg via INTRAVENOUS

## 2016-01-29 MED ORDER — ASPIRIN EC 81 MG PO TBEC: 81.0000 mg | DELAYED_RELEASE_TABLET | Freq: Every day | ORAL | 0 refills | Status: AC

## 2016-01-29 MED ORDER — ROCURONIUM BROMIDE 50 MG/5ML IV SOSY
PREFILLED_SYRINGE | INTRAVENOUS | Status: AC
  Filled 2016-01-29: qty 5

## 2016-01-29 MED ORDER — SUCCINYLCHOLINE CHLORIDE 200 MG/10ML IV SOSY
PREFILLED_SYRINGE | INTRAVENOUS | Status: AC
  Filled 2016-01-29: qty 10

## 2016-01-29 MED ORDER — OXYCODONE HCL 5 MG/5ML PO SOLN: 5.0000 mg | Freq: Once | ORAL | Status: DC | PRN

## 2016-01-29 MED ORDER — FENTANYL CITRATE (PF) 100 MCG/2ML IJ SOLN
INTRAMUSCULAR | Status: AC
  Filled 2016-01-29: qty 2

## 2016-01-29 MED ORDER — PROPOFOL 10 MG/ML IV BOLUS
INTRAVENOUS | Status: DC | PRN
  Administered 2016-01-29: 150 mg via INTRAVENOUS
  Administered 2016-01-29: 50 mg via INTRAVENOUS

## 2016-01-29 MED ORDER — POLYETHYLENE GLYCOL 3350 17 G PO PACK: 17.0000 g | PACK | Freq: Every day | ORAL | Status: DC | PRN

## 2016-01-29 MED ORDER — ENOXAPARIN SODIUM 40 MG/0.4ML ~~LOC~~ SOLN
40.0000 mg | SUBCUTANEOUS | Status: DC
  Administered 2016-01-30: 40 mg via SUBCUTANEOUS
  Filled 2016-01-29: qty 0.4

## 2016-01-29 MED ORDER — HYDROMORPHONE HCL 1 MG/ML IJ SOLN
0.2500 mg | INTRAMUSCULAR | Status: DC | PRN
  Administered 2016-01-29: 0.5 mg via INTRAVENOUS

## 2016-01-29 MED ORDER — CHLORHEXIDINE GLUCONATE 4 % EX LIQD: 60.0000 mL | Freq: Once | CUTANEOUS | Status: DC

## 2016-01-29 MED ORDER — PROPOFOL 10 MG/ML IV BOLUS
INTRAVENOUS | Status: AC
  Filled 2016-01-29: qty 20

## 2016-01-29 MED ORDER — ACETAMINOPHEN 325 MG PO TABS
650.0000 mg | ORAL_TABLET | Freq: Four times a day (QID) | ORAL | Status: DC
  Administered 2016-01-29 – 2016-01-30 (×3): 650 mg via ORAL
  Filled 2016-01-29 (×3): qty 2

## 2016-01-29 MED ORDER — METHOCARBAMOL 500 MG PO TABS
500.0000 mg | ORAL_TABLET | Freq: Four times a day (QID) | ORAL | Status: DC | PRN
  Administered 2016-01-30: 500 mg via ORAL
  Filled 2016-01-29: qty 1

## 2016-01-29 MED ORDER — OXYCODONE HCL 20 MG PO TABS: 20.0000 mg | ORAL_TABLET | Freq: Four times a day (QID) | ORAL | Status: DC | PRN

## 2016-01-29 MED ORDER — DOCUSATE SODIUM 100 MG PO CAPS: 100.0000 mg | ORAL_CAPSULE | Freq: Two times a day (BID) | ORAL | 0 refills | Status: DC

## 2016-01-29 MED ORDER — GLYCOPYRROLATE 0.2 MG/ML IV SOSY
PREFILLED_SYRINGE | INTRAVENOUS | Status: DC | PRN
Start: 2016-01-29 — End: 2016-01-29
  Administered 2016-01-29: .2 mg via INTRAVENOUS

## 2016-01-29 MED ORDER — OXYCODONE HCL 5 MG PO TABS: 5.0000 mg | ORAL_TABLET | Freq: Once | ORAL | Status: DC | PRN

## 2016-01-29 MED ORDER — PHENYLEPHRINE 40 MCG/ML (10ML) SYRINGE FOR IV PUSH (FOR BLOOD PRESSURE SUPPORT)
PREFILLED_SYRINGE | INTRAVENOUS | Status: AC
  Filled 2016-01-29: qty 10

## 2016-01-29 MED ORDER — METOCLOPRAMIDE HCL 5 MG PO TABS: 5.0000 mg | ORAL_TABLET | Freq: Three times a day (TID) | ORAL | Status: DC | PRN

## 2016-01-29 MED ORDER — DOCUSATE SODIUM 100 MG PO CAPS
100.0000 mg | ORAL_CAPSULE | Freq: Two times a day (BID) | ORAL | Status: DC
  Administered 2016-01-29 – 2016-01-30 (×2): 100 mg via ORAL
  Filled 2016-01-29 (×2): qty 1

## 2016-01-29 MED ORDER — ARTIFICIAL TEARS OP OINT
TOPICAL_OINTMENT | OPHTHALMIC | Status: AC
  Filled 2016-01-29: qty 3.5

## 2016-01-29 MED ORDER — ACETAMINOPHEN 650 MG RE SUPP: 650.0000 mg | Freq: Four times a day (QID) | RECTAL | Status: DC | PRN

## 2016-01-29 MED ORDER — OXYCODONE HCL 5 MG PO TABS
ORAL_TABLET | ORAL | Status: AC
  Administered 2016-01-29: 15 mg via ORAL
  Filled 2016-01-29: qty 3

## 2016-01-29 MED ORDER — ACETAMINOPHEN 325 MG PO TABS
ORAL_TABLET | ORAL | Status: AC
  Administered 2016-01-29: 975 mg via ORAL
  Filled 2016-01-29: qty 3

## 2016-01-29 MED ORDER — KETOROLAC TROMETHAMINE 15 MG/ML IJ SOLN
15.0000 mg | Freq: Four times a day (QID) | INTRAMUSCULAR | Status: DC
  Administered 2016-01-29 – 2016-01-30 (×3): 15 mg via INTRAVENOUS
  Filled 2016-01-29 (×2): qty 1

## 2016-01-29 MED ORDER — LIDOCAINE HCL (CARDIAC) 20 MG/ML IV SOLN
INTRAVENOUS | Status: DC | PRN
  Administered 2016-01-29: 60 mg via INTRATRACHEAL

## 2016-01-29 MED ORDER — ONDANSETRON HCL 4 MG/2ML IJ SOLN
INTRAMUSCULAR | Status: DC | PRN
  Administered 2016-01-29 (×2): 4 mg via INTRAVENOUS

## 2016-01-29 MED ORDER — IBUPROFEN 200 MG PO TABS: 800.0000 mg | ORAL_TABLET | Freq: Three times a day (TID) | ORAL | Status: DC | PRN

## 2016-01-29 MED ORDER — MIDAZOLAM HCL 2 MG/2ML IJ SOLN
INTRAMUSCULAR | Status: AC
  Filled 2016-01-29: qty 2

## 2016-01-29 MED ORDER — KETOROLAC TROMETHAMINE 15 MG/ML IJ SOLN
INTRAMUSCULAR | Status: AC
  Administered 2016-01-29: 15 mg via INTRAVENOUS
  Filled 2016-01-29: qty 1

## 2016-01-29 MED ORDER — DIPHENHYDRAMINE HCL 12.5 MG/5ML PO ELIX
12.5000 mg | ORAL_SOLUTION | ORAL | Status: DC | PRN
Start: 2016-01-29 — End: 2016-01-30

## 2016-01-29 MED ORDER — OMEPRAZOLE 20 MG PO CPDR: 20.0000 mg | DELAYED_RELEASE_CAPSULE | Freq: Every day | ORAL | 2 refills | Status: DC

## 2016-01-29 MED ORDER — METHOCARBAMOL 1000 MG/10ML IJ SOLN
500.0000 mg | Freq: Four times a day (QID) | INTRAMUSCULAR | Status: DC | PRN
  Filled 2016-01-29: qty 5

## 2016-01-29 MED ORDER — HYDROMORPHONE HCL 2 MG/ML IJ SOLN: 1.0000 mg | INTRAMUSCULAR | Status: DC | PRN

## 2016-01-29 MED ORDER — ATORVASTATIN CALCIUM 40 MG PO TABS: 40.0000 mg | ORAL_TABLET | Freq: Every day | ORAL | Status: DC

## 2016-01-29 MED ORDER — FENTANYL CITRATE (PF) 100 MCG/2ML IJ SOLN
50.0000 ug | Freq: Once | INTRAMUSCULAR | Status: AC
  Administered 2016-01-29: 50 ug via INTRAVENOUS

## 2016-01-29 MED ORDER — BUPIVACAINE-EPINEPHRINE (PF) 0.5% -1:200000 IJ SOLN
INTRAMUSCULAR | Status: DC | PRN
  Administered 2016-01-29: 30 mL via PERINEURAL

## 2016-01-29 MED ORDER — TRAZODONE HCL 50 MG PO TABS
50.0000 mg | ORAL_TABLET | Freq: Every day | ORAL | Status: DC
  Administered 2016-01-29: 50 mg via ORAL
  Filled 2016-01-29: qty 1

## 2016-01-29 MED ORDER — METOCLOPRAMIDE HCL 5 MG/ML IJ SOLN: 5.0000 mg | Freq: Three times a day (TID) | INTRAMUSCULAR | Status: DC | PRN

## 2016-01-29 MED ORDER — 0.9 % SODIUM CHLORIDE (POUR BTL) OPTIME
TOPICAL | Status: DC | PRN
  Administered 2016-01-29: 1000 mL

## 2016-01-29 MED ORDER — ROCURONIUM BROMIDE 100 MG/10ML IV SOLN
INTRAVENOUS | Status: DC | PRN
  Administered 2016-01-29: 30 mg via INTRAVENOUS
  Administered 2016-01-29: 50 mg via INTRAVENOUS

## 2016-01-29 MED ORDER — PROPOFOL 10 MG/ML IV BOLUS
INTRAVENOUS | Status: AC
Start: 2016-01-29 — End: 2016-01-29
  Filled 2016-01-29: qty 20

## 2016-01-29 MED ORDER — OXYCODONE HCL 5 MG PO TABS
5.0000 mg | ORAL_TABLET | ORAL | Status: DC | PRN
  Administered 2016-01-29 – 2016-01-30 (×3): 15 mg via ORAL
  Filled 2016-01-29 (×3): qty 3

## 2016-01-29 MED ORDER — LIDOCAINE 2% (20 MG/ML) 5 ML SYRINGE
INTRAMUSCULAR | Status: AC
  Filled 2016-01-29: qty 5

## 2016-01-29 MED ORDER — SUGAMMADEX SODIUM 200 MG/2ML IV SOLN
INTRAVENOUS | Status: DC | PRN
  Administered 2016-01-29: 200 mg via INTRAVENOUS

## 2016-01-29 SURGICAL SUPPLY — 47 items
BLADE SAW SAG 73X25 THK (BLADE) ×2
BLADE SAW SGTL 73X25 THK (BLADE) ×1 IMPLANT
CAPT SHLDR REVTOTAL 2 ×2 IMPLANT
CLOSURE WOUND 1/2 X4 (GAUZE/BANDAGES/DRESSINGS) ×1
COVER SURGICAL LIGHT HANDLE (MISCELLANEOUS) ×3 IMPLANT
DRAPE IMP U-DRAPE 54X76 (DRAPES) ×9 IMPLANT
DRAPE INCISE IOBAN 66X45 STRL (DRAPES) ×3 IMPLANT
DRAPE U-SHAPE 47X51 STRL (DRAPES) ×3 IMPLANT
DRILL BIT 5/64 (BIT) IMPLANT
DRSG ADAPTIC 3X8 NADH LF (GAUZE/BANDAGES/DRESSINGS) ×3 IMPLANT
DRSG PAD ABDOMINAL 8X10 ST (GAUZE/BANDAGES/DRESSINGS) ×6 IMPLANT
DURAPREP 26ML APPLICATOR (WOUND CARE) ×3 IMPLANT
ELECT REM PT RETURN 9FT ADLT (ELECTROSURGICAL) ×3
ELECTRODE REM PT RTRN 9FT ADLT (ELECTROSURGICAL) ×1 IMPLANT
GAUZE SPONGE 4X4 12PLY STRL (GAUZE/BANDAGES/DRESSINGS) ×3 IMPLANT
GLOVE BIO SURGEON STRL SZ7.5 (GLOVE) ×6 IMPLANT
GLOVE BIOGEL PI IND STRL 8 (GLOVE) ×2 IMPLANT
GLOVE BIOGEL PI INDICATOR 8 (GLOVE) ×4
GOWN STRL REUS W/ TWL LRG LVL3 (GOWN DISPOSABLE) ×2 IMPLANT
GOWN STRL REUS W/ TWL XL LVL3 (GOWN DISPOSABLE) ×1 IMPLANT
GOWN STRL REUS W/TWL LRG LVL3 (GOWN DISPOSABLE) ×9
GOWN STRL REUS W/TWL XL LVL3 (GOWN DISPOSABLE) ×3
KIT BASIN OR (CUSTOM PROCEDURE TRAY) ×3 IMPLANT
KIT ROOM TURNOVER OR (KITS) ×3 IMPLANT
MANIFOLD NEPTUNE II (INSTRUMENTS) ×3 IMPLANT
NDL HYPO 25GX1X1/2 BEV (NEEDLE) ×1 IMPLANT
NDL SUT .5 MAYO 1.404X.05X (NEEDLE) ×1 IMPLANT
NEEDLE HYPO 25GX1X1/2 BEV (NEEDLE) ×3 IMPLANT
NEEDLE MAYO TAPER (NEEDLE) ×3
NS IRRIG 1000ML POUR BTL (IV SOLUTION) ×3 IMPLANT
PACK SHOULDER (CUSTOM PROCEDURE TRAY) ×3 IMPLANT
PACK UNIVERSAL I (CUSTOM PROCEDURE TRAY) ×3 IMPLANT
PAD ARMBOARD 7.5X6 YLW CONV (MISCELLANEOUS) ×6 IMPLANT
SLING ARM IMMOBILIZER LRG (SOFTGOODS) ×3 IMPLANT
SPONGE LAP 18X18 X RAY DECT (DISPOSABLE) ×3 IMPLANT
STRIP CLOSURE SKIN 1/2X4 (GAUZE/BANDAGES/DRESSINGS) ×2 IMPLANT
SUCTION FRAZIER HANDLE 10FR (MISCELLANEOUS) ×2
SUCTION TUBE FRAZIER 10FR DISP (MISCELLANEOUS) ×1 IMPLANT
SUT FIBERWIRE #2 38 T-5 BLUE (SUTURE) ×12
SUT MNCRL AB 4-0 PS2 18 (SUTURE) ×3 IMPLANT
SUT MON AB 2-0 CT1 36 (SUTURE) ×3 IMPLANT
SUT VIC AB 0 CT1 27 (SUTURE) ×3
SUT VIC AB 0 CT1 27XBRD ANBCTR (SUTURE) ×1 IMPLANT
SUTURE FIBERWR #2 38 T-5 BLUE (SUTURE) ×4 IMPLANT
TOWEL OR 17X24 6PK STRL BLUE (TOWEL DISPOSABLE) ×3 IMPLANT
TOWER CARTRIDGE SMART MIX (DISPOSABLE) IMPLANT
WATER STERILE IRR 1000ML POUR (IV SOLUTION) ×1 IMPLANT

## 2016-01-29 NOTE — Anesthesia Postprocedure Evaluation (Signed)
Anesthesia Post Note  Patient: Samuel BucklerGary L Gibson  Procedure(s) Performed: Procedure(s) (LRB): TOTAL SHOULDER ARTHROPLASTY (Left)  Patient location during evaluation: PACU Anesthesia Type: General and Regional Level of consciousness: awake and alert and oriented Pain management: pain level controlled Vital Signs Assessment: post-procedure vital signs reviewed and stable Respiratory status: nonlabored ventilation, respiratory function stable, spontaneous breathing and patient connected to nasal cannula oxygen Cardiovascular status: blood pressure returned to baseline and stable Postop Assessment: no signs of nausea or vomiting Anesthetic complications: no Comments: Good sensory block left shoulder.       Last Vitals:  Vitals:   01/29/16 1700 01/29/16 1715  BP: 117/77 119/88  Pulse: 70 72  Resp: 18 15  Temp:      Last Pain:  Vitals:   01/29/16 1245  TempSrc: Oral                 Keyaria Lawson A.

## 2016-01-29 NOTE — Transfer of Care (Signed)
Immediate Anesthesia Transfer of Care Note  Patient: Trudie BucklerGary L Basaldua  Procedure(s) Performed: Procedure(s): TOTAL SHOULDER ARTHROPLASTY (Left)  Patient Location: PACU  Anesthesia Type:General  Level of Consciousness: awake  Airway & Oxygen Therapy: Patient Spontanous Breathing  Post-op Assessment: Report given to RN and Post -op Vital signs reviewed and stable  Post vital signs: Reviewed and stable  Last Vitals:  Vitals:   01/29/16 1657 01/29/16 1700  BP: 117/77 117/77  Pulse: 67 70  Resp: 15 18  Temp: 36.6 C     Last Pain:  Vitals:   01/29/16 1245  TempSrc: Oral         Complications: No apparent anesthesia complications

## 2016-01-29 NOTE — Progress Notes (Signed)
Orthopedic Tech Progress Note Patient Details:  Samuel Gibson 10/25/1955 [id number] Replacement sling Ortho Devices Type of Ortho Device: Arm sling Ortho Device/Splint Location: LUE Ortho Device/Splint Interventions: Ordered, Application   Jennye MoccasinHughes, Bobette Leyh Craig 01/29/2016, 10:18 PM

## 2016-01-29 NOTE — Discharge Instructions (Signed)
Diet: As you were doing prior to hospitalization   Shower:  May shower but keep the wounds dry, use an occlusive plastic wrap, NO SOAKING IN TUB.  If the bandage gets wet, change with a clean dry gauze.   Dressing:  Leave the dressing in place and we will change your bandages during your first follow-up appointment.    Activity:  Increase activity slowly as tolerated, but follow the weight bearing instructions below.  The rules on driving is that you can not be taking narcotics while you drive, and you must feel in control of the vehicle.    Weight Bearing:   Non weight bearing in sling.  To prevent constipation: you may use a stool softener such as -  Colace (over the counter) 100 mg by mouth twice a day  Drink plenty of fluids (prune juice may be helpful) and high fiber foods Miralax (over the counter) for constipation as needed.    Itching:  If you experience itching with your medications, try taking only a single pain pill, or even half a pain pill at a time.  You may take up to 10 pain pills per day, and you can also use benadryl over the counter for itching or also to help with sleep.   Precautions:  If you experience chest pain or shortness of breath - call 911 immediately for transfer to the hospital emergency department!!  If you develop a fever greater that 101 F, purulent drainage from wound, increased redness or drainage from wound, or calf pain -- Call the office at [phone]                                                Follow- Up Appointment:  Please call for an appointment to be seen in 2 weeks  - [phone]

## 2016-01-29 NOTE — Anesthesia Procedure Notes (Signed)
Procedure Name: Intubation Date/Time: 01/29/2016 2:14 PM Performed by: Rica Koyanagi Pre-anesthesia Checklist: Patient identified, Emergency Drugs available, Suction available and Patient being monitored Patient Re-evaluated:Patient Re-evaluated prior to inductionOxygen Delivery Method: Circle system utilized Preoxygenation: Pre-oxygenation with 100% oxygen Intubation Type: IV induction Laryngoscope Size: Mac and 3 Grade View: Grade II Tube type: Oral Tube size: 7.0 mm Number of attempts: 1 Airway Equipment and Method: Stylet Placement Confirmation: ETT inserted through vocal cords under direct vision,  positive ETCO2 and breath sounds checked- equal and bilateral Secured at: 23 cm Tube secured with: Tape Dental Injury: Teeth and Oropharynx as per pre-operative assessment

## 2016-01-29 NOTE — Anesthesia Preprocedure Evaluation (Signed)
Anesthesia Evaluation  Patient identified by MRN, date of birth, ID band Patient awake    Reviewed: Allergy & Precautions, H&P , NPO status , Patient's Chart, lab work & pertinent test results  Airway Mallampati: II   Neck ROM: full    Dental   Pulmonary Current Smoker,    breath sounds clear to auscultation       Cardiovascular hypertension,  Rhythm:regular Rate:Normal     Neuro/Psych PSYCHIATRIC DISORDERS Anxiety  Neuromuscular disease    GI/Hepatic   Endo/Other    Renal/GU stones     Musculoskeletal  (+) Arthritis ,   Abdominal   Peds  Hematology   Anesthesia Other Findings   Reproductive/Obstetrics                             Anesthesia Physical Anesthesia Plan  ASA: II  Anesthesia Plan: General   Post-op Pain Management:  Regional for Post-op pain   Induction: Intravenous  Airway Management Planned: Oral ETT  Additional Equipment:   Intra-op Plan:   Post-operative Plan: Extubation in OR  Informed Consent: I have reviewed the patients History and Physical, chart, labs and discussed the procedure including the risks, benefits and alternatives for the proposed anesthesia with the patient or authorized representative who has indicated his/her understanding and acceptance.     Plan Discussed with: CRNA, Anesthesiologist and Surgeon  Anesthesia Plan Comments:         Anesthesia Quick Evaluation

## 2016-01-29 NOTE — Anesthesia Procedure Notes (Signed)
Anesthesia Regional Block:  Interscalene brachial plexus block  Pre-Anesthetic Checklist: ,, timeout performed, Correct Patient, Correct Site, Correct Laterality, Correct Procedure, Correct Position, site marked, Risks and benefits discussed,  Surgical consent,  Pre-op evaluation,  At surgeon's request and post-op pain management  Laterality: Left  Prep: chloraprep       Needles:  Injection technique: Single-shot  Needle Type: Echogenic Stimulator Needle     Needle Length: 5cm 5 cm Needle Gauge: 22 and 22 G    Additional Needles:  Procedures: ultrasound guided (picture in chart) and nerve stimulator Interscalene brachial plexus block  Nerve Stimulator or Paresthesia:  Response: biceps flexion, 0.45 mA,   Additional Responses:   Narrative:  Start time: 01/29/2016 1:11 PM End time: 01/29/2016 1:18 PM Injection made incrementally with aspirations every 5 mL.  Performed by: Personally  Anesthesiologist: Kaniah Rizzolo  Additional Notes: Functioning IV was confirmed and monitors were applied.  A 50mm 22ga Arrow echogenic stimulator needle was used. Sterile prep and drape,hand hygiene and sterile gloves were used.  Negative aspiration and negative test dose prior to incremental administration of local anesthetic. The patient tolerated the procedure well.  Ultrasound guidance: relevent anatomy identified, needle position confirmed, local anesthetic spread visualized around nerve(s), vascular puncture avoided.  Image printed for medical record.

## 2016-01-29 NOTE — Interval H&P Note (Signed)
History and Physical Interval Note:  01/29/2016 12:52 PM  Samuel Gibson  has presented today for surgery, with the diagnosis of OA LEFT SHOULDER  The various methods of treatment have been discussed with the patient and family. After consideration of risks, benefits and other options for treatment, the patient has consented to  Procedure(s): TOTAL SHOULDER ARTHROPLASTY (Left) as a surgical intervention .  The patient's history has been reviewed, patient examined, no change in status, stable for surgery.  I have reviewed the patient's chart and labs.  Questions were answered to the patient's satisfaction.     Ellsie Violette D

## 2016-01-29 NOTE — Op Note (Signed)
01/29/2016  4:23 PM  PATIENT:  Samuel Gibson    PRE-OPERATIVE DIAGNOSIS:  OA LEFT SHOULDER  POST-OPERATIVE DIAGNOSIS:  Same  PROCEDURE:  TOTAL SHOULDER ARTHROPLASTY  SURGEON:  Jodel Mayhall, Jewel BaizeIMOTHY D, MD  PHYSICIAN ASSISTANT: Aquilla HackerHenry Martensen, PA-C, he was present and scrubbed throughout the case, critical for completion in a timely fashion, and for retraction, instrumentation, and closure.   ANESTHESIA:   General  PREOPERATIVE INDICATIONS:  Samuel Gibson is a  61 y.o. male with a diagnosis of OA LEFT SHOULDER who failed conservative measures and elected for surgical management.    The risks benefits and alternatives were discussed with the patient preoperatively including but not limited to the risks of infection, bleeding, nerve injury, cardiopulmonary complications, the need for revision surgery, dislocation, brachial plexus palsy, incomplete relief of pain, among others, and the patient was willing to proceed.  OPERATIVE IMPLANTS: Biomet size 12 humeral stem press-fit standard with a std mini reverse shoulder arthroplasty tray with a std liner and a std glenosphere  OPERATIVE PROCEDURE: The patient was brought to the operating room and placed in the supine position. General anesthesia was administered. IV antibiotics were given. A Foley was placed. Time out was performed. The upper extremity was prepped and draped in usual sterile fashion. The patient was in a beachchair position. Deltopectoral approach was carried out. The biceps was tenodesed to the pectoralis tendon with #2 Fiberwire. The subscapularis was released off of the bone.   I then performed circumferential releases of the humerus, and then dislocated the head, and then reamed with the reamer to the above named size.  I then applied the jig, and cut the humeral head in 30 of retroversion, and then turned my attention to the glenoid.  Deep retractors were placed, and I resected the labrum, and then placed a guidepin into the center  position on the glenoid, with slight inferior inclination. I then reamed over the guidepin, and this created a small metaphyseal cancellus blush inferiorly, removing just the cartilage to the subchondral bone superiorly. The base plate was selected and impacted place, and then I secured it centrally with a nonlocking screw, and I had excellent purchase both inferiorly and superiorly. I placed a short locking screws on anterior and posterior aspects.  I then turned my attention to the glenosphere, and impacted this into place, placing slight inferior offset (set on B).   The glenoid sphere was completely seated, and had engagement of the [facility] taper. I then turned my attention back to the humerus.  I sequentially broached, and then trialed, and was found to restore soft tissue tension, and it had 2 finger tightness. Therefore the above named components were selected. The shoulder felt stable throughout functional motion.  I then impacted the real prosthesis into place, as well as the real humeral tray, and reduced the shoulder. The shoulder had excellent motion, and was stable, and I irrigated the wounds copiously.   Before I placed the real prosthesis I had also placed a total of 3 #2 FiberWire through drill holes in the humerus. I then used these to repair the subscapularis. This came down to bone.  I then irrigated the shoulder copiously once more, repaired the deltopectoral interval with Vicryl followed by subcutaneous Vicryl with Steri-Strips and sterile gauze for the skin. The patient was awakened and returned back in stable and satisfactory condition. There no complications and He tolerated the procedure well.  POST OP PLAN: mobilize for dvt px

## 2016-01-30 ENCOUNTER — Encounter (HOSPITAL_COMMUNITY): Payer: Self-pay | Admitting: General Practice

## 2016-01-30 NOTE — Care Management (Signed)
Patient has no Home Health needs. Vance PeperSusan Coby Antrobus, RN BSN Case Manager

## 2016-01-30 NOTE — Discharge Summary (Signed)
Discharge Summary  Patient ID: Samuel Gibson MRN: [id number] DOB/AGE: 59/08/1955 59 y.o.  Admit date: 01/29/2016 Discharge date: 01/30/2016  Admission Diagnoses:  Primary osteoarthritis, left shoulder  Discharge Diagnoses:  Principal Problem:   Primary osteoarthritis, left shoulder Active Problems:   Back pain   History of lumbar surgery   Essential hypertension   Hx of renal calculi   Chronic pain Leukocytosis: likely reactive.  Afebrile.  No sign of infection.  Past Medical History:  Diagnosis Date  . Anxiety   . Arthritis   . Degenerative arthritis of lumbar spine   . Hernia of pelvic floor   . Hypertension   . Kidney calculi   . Kidney stones     Surgeries: Procedure(s): TOTAL SHOULDER ARTHROPLASTY on 01/29/2016   Consultants (if any):   Discharged Condition: Improved  Progress  Subjective: Feeling well.  OOB.  Pain controlled with PO meds.  Tolerating diet.  Urinating.  No CP, SOB.  Objective: General: NAD.  Upright in bed Resp: No increased WOB Cardio: regular rate and rhythm ABD soft Neurologically intact MSK RUE: Neurovascularly intact - good grip strength Sensation intact distally Hand warm Incision: dressing C/D/I  Plan: D/C IV fluids Weight Bearing: Non Weight Bearing (NWB) left arm Dressings: reinforce prn.  VTE prophylaxis: Lovenox inpatient, ambulation, SCDs.  ASA 81 mg x30 days after discharge. Dispo: Home  Today.  Hospital Course: Samuel Gibson is an 61 y.o. male who was admitted 01/29/2016 with a diagnosis of Primary osteoarthritis, left shoulder and went to the operating room on 01/29/2016 and underwent the above named procedures.    He was given perioperative antibiotics:  Anti-infectives    Start     Dose/Rate Route Frequency Ordered Stop   01/29/16 2000  ceFAZolin (ANCEF) IVPB 2g/100 mL premix     2 g 200 mL/hr over 30 Minutes Intravenous Every 6 hours 01/29/16 1852 01/30/16 1359   01/29/16 1400  ceFAZolin (ANCEF) IVPB 2g/100 mL  premix     2 g 200 mL/hr over 30 Minutes Intravenous To [facility]Stay [facility] 01/28/16 0949 01/29/16 1431    .  He was given sequential compression devices, early ambulation, and ASA 81 mg for DVT prophylaxis.  He benefited maximally from the hospital stay and there were no complications.    Recent vital signs:  Vitals:   01/29/16 2245 01/30/16 0529  BP: 107/76 112/70  Pulse: 84 77  Resp: 15 16  Temp: 99.1 F (37.3 C) 98.7 F (37.1 C)    Recent laboratory studies:  Lab Results  Component Value Date   HGB 13.9 01/29/2016   HGB 14.8 01/21/2016   HGB 14.5 03/12/2013   Lab Results  Component Value Date   WBC 20.8 (H) 01/29/2016   PLT 235 01/29/2016   Lab Results  Component Value Date   INR 0.93 07/22/2010   Lab Results  Component Value Date   NA 137 01/21/2016   K 4.0 01/21/2016   CL 106 01/21/2016   CO2 21 (L) 01/21/2016   BUN 11 01/21/2016   CREATININE 0.98 01/29/2016   GLUCOSE 145 (H) 01/21/2016    Discharge Medications:   Allergies as of 01/30/2016      Reactions   No Known Allergies       Medication List    TAKE these medications   aspirin EC 81 MG tablet Take 1 tablet (81 mg total) by mouth daily. For 30 days for dvt prophylaxis   atorvastatin 40 MG tablet Commonly known as:  LIPITOR  Take 40 mg by mouth daily at 6 PM.   docusate sodium 100 MG capsule Commonly known as:  COLACE Take 1 capsule (100 mg total) by mouth 2 (two) times daily. To prevent constipation while taking pain medication.   ibuprofen 800 MG tablet Commonly known as:  ADVIL,MOTRIN Take 1 tablet (800 mg total) by mouth 3 (three) times daily.   methocarbamol 500 MG tablet Commonly known as:  ROBAXIN Take 1 tablet (500 mg total) by mouth every 6 (six) hours as needed for muscle spasms.   metoprolol succinate 50 MG 24 hr tablet Commonly known as:  TOPROL-XL Take 50 mg by mouth at bedtime. Take with or immediately following a meal.   omeprazole 20 MG capsule Commonly known as:   PRILOSEC Take 1 capsule (20 mg total) by mouth daily. While taking anti inflammatory medicine daily   ondansetron 4 MG tablet Commonly known as:  ZOFRAN Take 1 tablet (4 mg total) by mouth every 8 (eight) hours as needed for nausea or vomiting.   Oxycodone HCl 20 MG Tabs Take 20 mg by mouth every 6 (six) hours as needed.   oxyCODONE-acetaminophen 5-325 MG tablet Commonly known as:  ROXICET Take 1-2 tablets by mouth every 4 (four) hours as needed for severe pain. For breakthrough pain only.  Utilize previously prescribed chronic medicine first.   traZODone 50 MG tablet Commonly known as:  DESYREL Take 50 mg by mouth at bedtime.       Diagnostic Studies: Dg Shoulder Left Port  Result Date: 01/29/2016 CLINICAL DATA:  Post left shoulder surgery.  Primary osteoarthritis. EXAM: LEFT SHOULDER - 1 VIEW COMPARISON:  12/26/2015 FINDINGS: Total left shoulder arthroplasty. Left shoulder appears located. Densities at left lung base are suggestive for atelectasis. Lucency in the soft tissues are compatible with recent surgery. Normal alignment of the left AC joint. IMPRESSION: Left shoulder arthroplasty without complicating features. Electronically Signed   By: Richarda Overlie M.D.   On: 01/29/2016 19:21    Disposition: 01-Home or Self Care   Signed: Albina Billet III PA-C 01/30/2016, 6:21 AM

## 2016-02-13 DIAGNOSIS — M19012 Primary osteoarthritis, left shoulder: Secondary | ICD-10-CM | POA: Diagnosis not present

## 2016-02-27 ENCOUNTER — Ambulatory Visit: Payer: Medicare Other | Attending: Orthopedic Surgery | Admitting: Physical Therapy

## 2016-02-27 ENCOUNTER — Encounter: Payer: Self-pay | Admitting: Physical Therapy

## 2016-02-27 DIAGNOSIS — M6281 Muscle weakness (generalized): Secondary | ICD-10-CM | POA: Diagnosis not present

## 2016-02-27 DIAGNOSIS — M25612 Stiffness of left shoulder, not elsewhere classified: Secondary | ICD-10-CM | POA: Diagnosis not present

## 2016-02-27 DIAGNOSIS — M25512 Pain in left shoulder: Secondary | ICD-10-CM | POA: Insufficient documentation

## 2016-02-27 NOTE — Therapy (Signed)
[facility] [address] [place], [place], [address] Phone: [phone]   Fax:  [phone]  Physical Therapy Evaluation  Patient Details  Name: Samuel Gibson MRN: [id number] Date of Birth: [birth year] Referring Provider: Margarita Rana, MD  Encounter Date: 02/27/2016      PT End of Session - 02/27/16 0848    Visit Number 1   Number of Visits 21   Date for PT Re-Evaluation 05/08/16   Authorization Type MCR   PT Start Time 0845   PT Stop Time 0940   PT Time Calculation (min) 55 min   Activity Tolerance Patient tolerated treatment well   Behavior During Therapy [facility] for tasks assessed/performed      Past Medical History:  Diagnosis Date  . Anxiety   . Arthritis   . Degenerative arthritis of lumbar spine   . Hernia of pelvic floor   . History of kidney stones   . Hypertension     Past Surgical History:  Procedure Laterality Date  . BACK SURGERY    . SHOULDER SURGERY    . 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 TOTAL SHOULDER ARTHROPLASTY Left 01/29/2016   Procedure: TOTAL SHOULDER ARTHROPLASTY;  Surgeon: Sheral Apley, MD;  Location: [facility] OR;  Service: Orthopedics;  Laterality: Left;    There were no vitals filed for this visit.       Subjective Assessment - 02/27/16 0850    Subjective Reports shoulder aches at times. Reports having 6 german shepherds at home. Reports he is told to avoid lifting, is doing everything independently.    Patient Stated Goals hunting, fishing, dogs   Currently in Pain? Yes   Pain Score 4    Pain Location Shoulder   Pain Orientation Left   Pain Descriptors / Indicators Aching   Pain Type Surgical pain   Pain Onset 1 to 4 weeks ago   Pain Frequency Intermittent   Aggravating Factors  reaching   Pain Relieving Factors rest            [facility] PT Assessment - 02/27/16 0001      Assessment   Medical Diagnosis L reverse TSA   Referring Provider Margarita Rana, MD   Onset  Date/Surgical Date 01/29/16   Hand Dominance Right   Next MD Visit --  3 weeks   Prior Therapy no     Precautions   Precautions Shoulder   Type of Shoulder Precautions L reverse TSA     Restrictions   Weight Bearing Restrictions No     Balance Screen   Has the patient fallen in the past 6 months No     Home Environment   Living Environment Private residence   Living Arrangements Alone   Additional Comments porch steps     Prior Function   Level of Independence Independent     Cognition   Overall Cognitive Status Within Functional Limits for tasks assessed     Sensation   Additional Comments WFL     ROM / Strength   AROM / PROM / Strength AROM;Strength     AROM   AROM Assessment Site Shoulder   Right/Left Shoulder Left   Left Shoulder Flexion 93 Degrees  PROM visually to 110   Left Shoulder ABduction 67 Degrees   Left Shoulder External Rotation --  neutral in standing     Strength   Overall Strength Comments able to move against gravity, just below avail ROM. Unable to test due to precautions  Palpation   Palpation comment TTP L upper trap                   [facility] Adult PT Treatment/Exercise - 02/27/16 0001      Exercises   Exercises Shoulder     Shoulder Exercises: Seated   Other Seated Exercises scapular retraction for posture     Shoulder Exercises: Standing   Flexion Limitations AROM in mirror  verbal and tactile cues required     Modalities   Modalities Vasopneumatic     Vasopneumatic   Number Minutes Vasopneumatic  15 minutes   Vasopnuematic Location  Shoulder  L   Vasopneumatic Pressure Low   Vasopneumatic Temperature  coldest     Manual Therapy   Manual Therapy Passive ROM;Myofascial release   Myofascial Release L upper trap and suboccipital trigger point release   Passive ROM L GHJ flx, ER                PT Education - 02/27/16 0945    Education provided Yes   Education Details anatomy of condition, POC, HEP,  exercise form/rationale, coordination before strengthening, postural awareness   Person(s) Educated Patient   Methods Explanation;Demonstration;Tactile cues;Verbal cues   Comprehension Verbalized understanding;Returned demonstration;Verbal cues required;Tactile cues required;Need further instruction          PT Short Term Goals - 02/27/16 0951      PT SHORT TERM GOAL #1   Title Pt will demo AROM flexion to 90 deg without L GHJ elevation indicating improved scapular control by 3/16   Baseline elevation with excessive scapular movement in AROM at eval   Time 3   Period Weeks   Status New     PT SHORT TERM GOAL #2   Title Pt will verbalize improved postural awareness, being cognisant of scapular retraction during functional activities   Baseline began educating at eval   Time 3   Period Weeks   Status New           PT Long Term Goals - 02/27/16 4098      PT LONG TERM GOAL #1   Title Pt will be able to reach for and lift objects to high cabinets in the house without pain or limitation by GHJ by 5/18   Baseline limited by ROM and strength at evaluation   Time 12   Period Weeks   Status New     PT LONG TERM GOAL #2   Title Pt will demo 5/5 MMT in all shoulder movements to indicate proper support to shoulder joint   Baseline unable to test at eval due to precautions   Time 12   Period Weeks   Status New     PT LONG TERM GOAL #3   Title Pt will demo good scapular control in reaching and lifting using L arm to improve functional ability   Baseline excessive scapular mobility at eval resulting in poor mechanics    Time 12   Period Weeks   Status New     PT LONG TERM GOAL #4   Title Pt will be able to return to fishing and hunting activities and verbalize knowledge of postural considerations to return to PLOF.    Baseline will progress toward these activities as tolerated   Time 12   Period Weeks   Status New               Plan - 02/27/16 0946    Clinical  Impression Statement Pt presents  to PT with complaints of L shoulder pain and limited functional use. Pt is 4 weeks post op for reverse TSA. Pt with kyphotic posture and forward rounded shoulders. Poor scapular control resulting in overuse of upper trap and cervical discomfort. Pt requires heavy cuing to slow down and focus on coordination of movement. Pt will benefit from skilled PT in order to progress appopriately through post surgical protocol and meet long term functional goals.    Rehab Potential Good   PT Frequency 2x / week   PT Duration 12 weeks   PT Treatment/Interventions ADLs/Self Care Home Management;Cryotherapy;Electrical Stimulation;Iontophoresis 4mg /ml Dexamethasone;Moist Heat;Ultrasound;Therapeutic activities;Therapeutic exercise;Neuromuscular re-education;Patient/family education;Passive range of motion;Scar mobilization;Manual techniques;Dry needling;Taping;Vasopneumatic Device   PT Next Visit Plan PROM, AROM with cues for scapular control- see protocol   PT Home Exercise Plan scapular retraction/postural awareness, AROM to 90 in mirror   Consulted and Agree with Plan of Care Patient      Patient will benefit from skilled therapeutic intervention in order to improve the following deficits and impairments:  Impaired UE functional use, Increased muscle spasms, Decreased range of motion, Pain, Decreased activity tolerance, Impaired flexibility, Improper body mechanics, Postural dysfunction, Decreased strength, Decreased mobility  Visit Diagnosis: Acute pain of left shoulder - Plan: PT plan of care cert/re-cert  Muscle weakness (generalized) - Plan: PT plan of care cert/re-cert  Stiffness of left shoulder, not elsewhere classified - Plan: PT plan of care cert/re-cert      G-Codes - 02/27/16 0959    Functional Assessment Tool Used (Outpatient Only) clinical judgement, ROM, strength   Functional Limitation Carrying, moving and handling objects   Carrying, Moving and Handling  Objects Current Status ([id number]) At least 80 percent but less than 100 percent impaired, limited or restricted   Carrying, Moving and Handling Objects Goal Status (U0454(G8985) At least 20 percent but less than 40 percent impaired, limited or restricted       Problem List Patient Active Problem List   Diagnosis Date Noted  . Back pain 01/14/2016  . History of lumbar surgery 01/14/2016  . Essential hypertension 01/14/2016  . Hx of renal calculi 01/14/2016  . Chronic pain 01/14/2016  . Primary osteoarthritis, left shoulder 01/14/2016   Zoua Caporaso C. Gaege Sangalang PT, DPT 02/27/16 10:03 AM   [facility] [address] [place], [place], [address] Phone: [phone]   Fax:  [phone]  Name: Samuel Gibson MRN: [id number] Date of Birth: 07/25/1955

## 2016-03-04 ENCOUNTER — Ambulatory Visit: Payer: Medicare Other | Admitting: Physical Therapy

## 2016-03-04 ENCOUNTER — Encounter: Payer: Self-pay | Admitting: Physical Therapy

## 2016-03-04 DIAGNOSIS — M6281 Muscle weakness (generalized): Secondary | ICD-10-CM | POA: Diagnosis not present

## 2016-03-04 DIAGNOSIS — M25512 Pain in left shoulder: Secondary | ICD-10-CM

## 2016-03-04 DIAGNOSIS — M25612 Stiffness of left shoulder, not elsewhere classified: Secondary | ICD-10-CM

## 2016-03-04 NOTE — Therapy (Signed)
[facility] [address] [place], [place], [address] Phone: [phone]   Fax:  [phone]  Physical Therapy Treatment  Patient Details  Name: Samuel Gibson MRN: [id number] Date of Birth: 05/13/1955 Referring Provider: Margarita Ranaimothy Murphy, MD  Encounter Date: 03/04/2016      PT End of Session - 03/04/16 0802    Visit Number 2   Number of Visits 21   Date for PT Re-Evaluation 05/08/16   Authorization Type MCR   PT Start Time 0800   PT Stop Time 0850   PT Time Calculation (min) 50 min   Activity Tolerance Patient tolerated treatment well   Behavior During Therapy [facility] for tasks assessed/performed      Past Medical History:  Diagnosis Date  . Anxiety   . Arthritis   . Degenerative arthritis of lumbar spine   . Hernia of pelvic floor   . History of kidney stones   . Hypertension     Past Surgical History:  Procedure Laterality Date  . BACK SURGERY    . SHOULDER SURGERY    . 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 TOTAL SHOULDER ARTHROPLASTY Left 01/29/2016   Procedure: TOTAL SHOULDER ARTHROPLASTY;  Surgeon: Sheral Apleyimothy D Murphy, MD;  Location: [facility] OR;  Service: Orthopedics;  Laterality: Left;    There were no vitals filed for this visit.      Subjective Assessment - 03/04/16 0803    Subjective reports having a bad night last night. noticed wheeping from incision yesterday.    Patient Stated Goals hunting, fishing, dogs   Currently in Pain? Yes   Pain Score 7    Pain Location Shoulder   Pain Orientation Left                         [facility] Adult PT Treatment/Exercise - 03/04/16 0001      Exercises   Exercises Wrist     Shoulder Exercises: Supine   Other Supine Exercises PT resisted Elbow flexion/ext submax     Shoulder Exercises: Isometric Strengthening   Flexion Limitations submax   Extension Limitations submax, avoid extension past neutral   ABduction Limitations submax     Shoulder  Exercises: Stretch   Table Stretch -Flexion Limitations with wand     Wrist Exercises   Forearm Pronation Limitations yellow therabar smileys/frownies     Vasopneumatic   Number Minutes Vasopneumatic  15 minutes   Vasopnuematic Location  Shoulder   Vasopneumatic Pressure Low   Vasopneumatic Temperature  35 deg     Manual Therapy   Passive ROM L GHJ flx, ER, scaption per protocol                PT Education - 03/04/16 0836    Education provided Yes   Education Details protocol, exercise form/rationale, HEP   Person(s) Educated Patient   Methods Explanation;Demonstration;Tactile cues;Verbal cues;Handout   Comprehension Verbalized understanding;Returned demonstration;Verbal cues required;Tactile cues required;Need further instruction          PT Short Term Goals - 02/27/16 0951      PT SHORT TERM GOAL #1   Title Pt will demo AROM flexion to 90 deg without L GHJ elevation indicating improved scapular control by 3/16   Baseline elevation with excessive scapular movement in AROM at eval   Time 3   Period Weeks   Status New     PT SHORT TERM GOAL #2   Title Pt will verbalize improved postural awareness,  being cognisant of scapular retraction during functional activities   Baseline began educating at eval   Time 3   Period Weeks   Status New           PT Long Term Goals - 02/27/16 0953      PT LONG TERM GOAL #1   Title Pt will be able to reach for and lift objects to high cabinets in the house without pain or limitation by GHJ by 5/18   Baseline limited by ROM and strength at evaluation   Time 12   Period Weeks   Status New     PT LONG TERM GOAL #2   Title Pt will demo 5/5 MMT in all shoulder movements to indicate proper support to shoulder joint   Baseline unable to test at eval due to precautions   Time 12   Period Weeks   Status New     PT LONG TERM GOAL #3   Title Pt will demo good scapular control in reaching and lifting using L arm to improve  functional ability   Baseline excessive scapular mobility at eval resulting in poor mechanics    Time 12   Period Weeks   Status New     PT LONG TERM GOAL #4   Title Pt will be able to return to fishing and hunting activities and verbalize knowledge of postural considerations to return to PLOF.    Baseline will progress toward these activities as tolerated   Time 12   Period Weeks   Status New               Plan - 03/04/16 1191    Clinical Impression Statement Provided pt with copy of TSA protocol for better understanding of therapy progression. Pt is able to utilize shoulder and requires cues to slow use to allow healing. PROM to approx 110 deg today where he reported beginning pulling. Cont difficulty wiht scapular retraction.    PT Next Visit Plan PROM, AROM with cues for scapular control- see protocol   PT Home Exercise Plan scapular retraction/postural awareness, AROM in Mirror, supine AAROM flexion   Consulted and Agree with Plan of Care Patient      Patient will benefit from skilled therapeutic intervention in order to improve the following deficits and impairments:     Visit Diagnosis: Acute pain of left shoulder  Muscle weakness (generalized)  Stiffness of left shoulder, not elsewhere classified     Problem List Patient Active Problem List   Diagnosis Date Noted  . Back pain 01/14/2016  . History of lumbar surgery 01/14/2016  . Essential hypertension 01/14/2016  . Hx of renal calculi 01/14/2016  . Chronic pain 01/14/2016  . Primary osteoarthritis, left shoulder 01/14/2016    Samuel Gibson PT, DPT 03/04/16 8:40 AM   [facility] [address] [place], [place], [address] Phone: [phone]   Fax:  [phone]  Name: Samuel Gibson MRN: [id number] Date of Birth: 12-22-1955

## 2016-03-06 ENCOUNTER — Ambulatory Visit: Payer: Medicare Other | Admitting: Physical Therapy

## 2016-03-10 ENCOUNTER — Ambulatory Visit: Payer: Medicare Other | Attending: Orthopedic Surgery | Admitting: Physical Therapy

## 2016-03-10 DIAGNOSIS — M25612 Stiffness of left shoulder, not elsewhere classified: Secondary | ICD-10-CM | POA: Insufficient documentation

## 2016-03-10 DIAGNOSIS — M6281 Muscle weakness (generalized): Secondary | ICD-10-CM | POA: Insufficient documentation

## 2016-03-10 DIAGNOSIS — M25512 Pain in left shoulder: Secondary | ICD-10-CM | POA: Insufficient documentation

## 2016-03-12 ENCOUNTER — Ambulatory Visit: Payer: Medicare Other | Admitting: Physical Therapy

## 2016-03-12 ENCOUNTER — Encounter: Payer: Self-pay | Admitting: Physical Therapy

## 2016-03-12 DIAGNOSIS — M25612 Stiffness of left shoulder, not elsewhere classified: Secondary | ICD-10-CM

## 2016-03-12 DIAGNOSIS — M19012 Primary osteoarthritis, left shoulder: Secondary | ICD-10-CM | POA: Diagnosis not present

## 2016-03-12 DIAGNOSIS — M25512 Pain in left shoulder: Secondary | ICD-10-CM

## 2016-03-12 DIAGNOSIS — M6281 Muscle weakness (generalized): Secondary | ICD-10-CM | POA: Diagnosis not present

## 2016-03-12 NOTE — Therapy (Signed)
[facility] [address] [place], [place], [address] Phone: [phone]   Fax:  [phone]  Physical Therapy Treatment  Patient Details  Name: Samuel Gibson MRN: [id number] Date of Birth: 07-20-1955 Referring Provider: Margarita Rana, MD  Encounter Date: 03/12/2016      PT End of Session - 03/12/16 0801    Visit Number 3   Number of Visits 21   Date for PT Re-Evaluation 05/08/16   Authorization Type MCR   PT Start Time 0802   PT Stop Time 0851   PT Time Calculation (min) 49 min   Activity Tolerance Patient tolerated treatment well   Behavior During Therapy [facility] for tasks assessed/performed      Past Medical History:  Diagnosis Date  . Anxiety   . Arthritis   . Degenerative arthritis of lumbar spine   . Hernia of pelvic floor   . History of kidney stones   . Hypertension     Past Surgical History:  Procedure Laterality Date  . BACK SURGERY    . SHOULDER SURGERY    . 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 TOTAL SHOULDER ARTHROPLASTY Left 01/29/2016   Procedure: TOTAL SHOULDER ARTHROPLASTY;  Surgeon: Sheral Apley, MD;  Location: [facility] OR;  Service: Orthopedics;  Laterality: Left;    There were no vitals filed for this visit.      Subjective Assessment - 03/12/16 0803    Subjective pt reports falling out of recliner due to being in a trance while ill. feels very sore today but is able to move arm.    Patient Stated Goals hunting, fishing, dogs   Currently in Pain? Yes   Pain Score 8    Pain Location Shoulder   Pain Orientation Left   Pain Descriptors / Indicators Sore                         [facility] Adult PT Treatment/Exercise - 03/12/16 0001      Vasopneumatic   Number Minutes Vasopneumatic  15 minutes   Vasopnuematic Location  Shoulder   Vasopneumatic Pressure Low   Vasopneumatic Temperature  35 deg     Manual Therapy   Manual Therapy Joint mobilization   Manual therapy comments  soft tissue work L cervical and upper trap   Joint Mobilization grade 2 post & inf mob   Passive ROM flexion, ER, IR per protocol                  PT Short Term Goals - 02/27/16 0951      PT SHORT TERM GOAL #1   Title Pt will demo AROM flexion to 90 deg without L GHJ elevation indicating improved scapular control by 3/16   Baseline elevation with excessive scapular movement in AROM at eval   Time 3   Period Weeks   Status New     PT SHORT TERM GOAL #2   Title Pt will verbalize improved postural awareness, being cognisant of scapular retraction during functional activities   Baseline began educating at eval   Time 3   Period Weeks   Status New           PT Long Term Goals - 02/27/16 6962      PT LONG TERM GOAL #1   Title Pt will be able to reach for and lift objects to high cabinets in the house without pain or limitation by GHJ by 5/18   Baseline limited  by ROM and strength at evaluation   Time 12   Period Weeks   Status New     PT LONG TERM GOAL #2   Title Pt will demo 5/5 MMT in all shoulder movements to indicate proper support to shoulder joint   Baseline unable to test at eval due to precautions   Time 12   Period Weeks   Status New     PT LONG TERM GOAL #3   Title Pt will demo good scapular control in reaching and lifting using L arm to improve functional ability   Baseline excessive scapular mobility at eval resulting in poor mechanics    Time 12   Period Weeks   Status New     PT LONG TERM GOAL #4   Title Pt will be able to return to fishing and hunting activities and verbalize knowledge of postural considerations to return to PLOF.    Baseline will progress toward these activities as tolerated   Time 12   Period Weeks   Status New               Plan - 03/12/16 [id number]    Clinical Impression Statement Pt arrived to PT after being ill for an extended period of time. He fell out of his recliner at home, hitting the post aspect of his  shoulder on a small coffee table. He is able to demo AROM with some soreness around lower, L cervical region. Discomfort in the same area in passive ER but pt is unable to fully relax, resulting in activation of those muscles that he hit when moving his arm. Focused on decreasing myofasical pain and PROM to decrease soreness today and will progress exercises as appropriate at next visit.    PT Next Visit Plan PROM, AROM with cues for scapular control- see protocol   PT Home Exercise Plan scapular retraction/postural awareness, AROM in Mirror, supine AAROM flexion   Consulted and Agree with Plan of Care Patient      Patient will benefit from skilled therapeutic intervention in order to improve the following deficits and impairments:     Visit Diagnosis: Acute pain of left shoulder  Muscle weakness (generalized)  Stiffness of left shoulder, not elsewhere classified     Problem List Patient Active Problem List   Diagnosis Date Noted  . Back pain 01/14/2016  . History of lumbar surgery 01/14/2016  . Essential hypertension 01/14/2016  . Hx of renal calculi 01/14/2016  . Chronic pain 01/14/2016  . Primary osteoarthritis, left shoulder 01/14/2016    Deshaun Weisinger C. Cynia Abruzzo PT, DPT 03/12/16 8:45 AM   [facility] [address] [place], [place], [address] Phone: [phone]   Fax:  [phone]  Name: Samuel Gibson MRN: [id number] Date of Birth: 02/09/1955

## 2016-03-18 ENCOUNTER — Ambulatory Visit: Payer: Medicare Other | Admitting: Physical Therapy

## 2016-03-20 ENCOUNTER — Ambulatory Visit: Payer: Medicare Other | Admitting: Physical Therapy

## 2016-03-20 DIAGNOSIS — M6281 Muscle weakness (generalized): Secondary | ICD-10-CM | POA: Diagnosis not present

## 2016-03-20 DIAGNOSIS — M25612 Stiffness of left shoulder, not elsewhere classified: Secondary | ICD-10-CM

## 2016-03-20 DIAGNOSIS — M25512 Pain in left shoulder: Secondary | ICD-10-CM

## 2016-03-20 NOTE — Therapy (Signed)
[facility] [address] [place], [place], [address] Phone: [phone]   Fax:  [phone]  Physical Therapy Treatment  Patient Details  Name: Samuel Gibson MRN: [id number] Date of Birth: [birth year] Referring Provider: Margarita Rana, MD  Encounter Date: 03/20/2016      PT End of Session - 03/20/16 0959    Visit Number 4   Number of Visits 21   Date for PT Re-Evaluation 05/08/16   Authorization Type MCR   PT Start Time 0850   PT Stop Time 0928   PT Time Calculation (min) 38 min   Activity Tolerance Patient tolerated treatment well   Behavior During Therapy [facility] for tasks assessed/performed      Past Medical History:  Diagnosis Date  . Anxiety   . Arthritis   . Degenerative arthritis of lumbar spine   . Hernia of pelvic floor   . History of kidney stones   . Hypertension     Past Surgical History:  Procedure Laterality Date  . BACK SURGERY    . SHOULDER SURGERY    . 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 TOTAL SHOULDER ARTHROPLASTY Left 01/29/2016   Procedure: TOTAL SHOULDER ARTHROPLASTY;  Surgeon: Sheral Apley, MD;  Location: [facility];  Service: Orthopedics;  Laterality: Left;    There were no vitals filed for this visit.      Subjective Assessment - 03/20/16 0852    Subjective Saw the PA, everthing looks good.  Wants me to start working on strengthening    Currently in Pain? Yes   Pain Score 6    Pain Location Shoulder   Pain Orientation Left   Pain Descriptors / Indicators Aching   Pain Type Surgical pain   Aggravating Factors  weather, sleep positioning    Pain Relieving Factors rest            [facility] PT Assessment - 03/20/16 0001      ROM / Strength   AROM / PROM / Strength PROM     AROM   Left Shoulder Flexion 98 Degrees  standing      PROM   PROM Assessment Site Shoulder   Right/Left Shoulder Left   Left Shoulder Flexion 125 Degrees   Left Shoulder ABduction 100 Degrees   Left  Shoulder External Rotation 15 Degrees                     [facility] Adult PT Treatment/Exercise - 03/20/16 0001      Shoulder Exercises: ROM/Strengthening   Other ROM/Strengthening Exercises supie clasped hand AAROM flexion pullovers, also ER AAROM with cane 5 sec x 10    Other ROM/Strengthening Exercises UE ranger standing, fully extended, AAROM flexion , horizontal abdct/addct, small ROM.      Shoulder Exercises: Isometric Strengthening   Flexion 5X10"   Flexion Limitations submax   Extension 5X10"   Extension Limitations submax, avoid extension past neutral   Internal Rotation 5X10"   ABduction Limitations submax     Manual Therapy   Passive ROM Flexion and ER                 PT Education - 03/20/16 0923    Education provided Yes   Education Details HEP   Person(s) Educated Patient   Methods Explanation;Handout   Comprehension Verbalized understanding          PT Short Term Goals - 02/27/16 0951      PT SHORT TERM GOAL #1  Title Pt will demo AROM flexion to 90 deg without L GHJ elevation indicating improved scapular control by 3/16   Baseline elevation with excessive scapular movement in AROM at eval   Time 3   Period Weeks   Status New     PT SHORT TERM GOAL #2   Title Pt will verbalize improved postural awareness, being cognisant of scapular retraction during functional activities   Baseline began educating at eval   Time 3   Period Weeks   Status New           PT Long Term Goals - 02/27/16 [id number]      PT LONG TERM GOAL #1   Title Pt will be able to reach for and lift objects to high cabinets in the house without pain or limitation by GHJ by 5/18   Baseline limited by ROM and strength at evaluation   Time 12   Period Weeks   Status New     PT LONG TERM GOAL #2   Title Pt will demo 5/5 MMT in all shoulder movements to indicate proper support to shoulder joint   Baseline unable to test at eval due to precautions   Time 12   Period  Weeks   Status New     PT LONG TERM GOAL #3   Title Pt will demo good scapular control in reaching and lifting using L arm to improve functional ability   Baseline excessive scapular mobility at eval resulting in poor mechanics    Time 12   Period Weeks   Status New     PT LONG TERM GOAL #4   Title Pt will be able to return to fishing and hunting activities and verbalize knowledge of postural considerations to return to PLOF.    Baseline will progress toward these activities as tolerated   Time 12   Period Weeks   Status New               Plan - 03/20/16 [id number]    Clinical Impression Statement Pt reports MD visit went well. He reports constant resting pain. His ER is very limited. Issued AAROM with WAND. Also issued deltoid isometrics. Began AAROM with UE ranger under shoulder height, focusing on scapular control.    PT Next Visit Plan PROM, AROM with cues for scapular control- see protocol   PT Home Exercise Plan scapular retraction/postural awareness, AROM in Mirror, supine AAROM flexion, isometrics, AAROM ER    Consulted and Agree with Plan of Care Patient      Patient will benefit from skilled therapeutic intervention in order to improve the following deficits and impairments:  Impaired UE functional use, Increased muscle spasms, Decreased range of motion, Pain, Decreased activity tolerance, Impaired flexibility, Improper body mechanics, Postural dysfunction, Decreased strength, Decreased mobility  Visit Diagnosis: Muscle weakness (generalized)  Stiffness of left shoulder, not elsewhere classified  Acute pain of left shoulder     Problem List Patient Active Problem List   Diagnosis Date Noted  . Back pain 01/14/2016  . History of lumbar surgery 01/14/2016  . Essential hypertension 01/14/2016  . Hx of renal calculi 01/14/2016  . Chronic pain 01/14/2016  . Primary osteoarthritis, left shoulder 01/14/2016    Sherrie Mustacheonoho, Jessica McGee, PTA 03/20/2016, 1:23  PM  [facility] [address] [place], [place], [address] Phone: [phone]   Fax:  [phone]  Name: Samuel Gibson MRN: [id number] Date of Birth: 04/10/1955

## 2016-03-25 ENCOUNTER — Encounter: Payer: Self-pay | Admitting: Physical Therapy

## 2016-03-25 ENCOUNTER — Ambulatory Visit: Payer: Medicare Other | Admitting: Physical Therapy

## 2016-03-25 DIAGNOSIS — M25512 Pain in left shoulder: Secondary | ICD-10-CM | POA: Diagnosis not present

## 2016-03-25 DIAGNOSIS — M25612 Stiffness of left shoulder, not elsewhere classified: Secondary | ICD-10-CM

## 2016-03-25 DIAGNOSIS — M6281 Muscle weakness (generalized): Secondary | ICD-10-CM

## 2016-03-25 NOTE — Therapy (Signed)
[place], [place], [address] Phone: [phone]   Fax:  [phone]  Physical Therapy Treatment  Patient Details  Name: Samuel Gibson MRN: [id number] Date of Birth: 04-Dec-1955 Referring Provider: Edmonia Lynch, MD  Encounter Date: 03/25/2016      PT End of Session - 03/25/16 0802    Visit Number 5   Number of Visits 21   Date for PT Re-Evaluation 05/08/16   Authorization Type MCR   PT Start Time 0802   PT Stop Time 0845   PT Time Calculation (min) 43 min   Activity Tolerance Patient tolerated treatment well   Behavior During Therapy [facility] for tasks assessed/performed      Past Medical History:  Diagnosis Date  . Anxiety   . Arthritis   . Degenerative arthritis of lumbar spine   . Hernia of pelvic floor   . History of kidney stones   . Hypertension     Past Surgical History:  Procedure Laterality Date  . BACK SURGERY    . SHOULDER SURGERY    . 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 TOTAL SHOULDER ARTHROPLASTY Left 01/29/2016   Procedure: TOTAL SHOULDER ARTHROPLASTY;  Surgeon: Renette Butters, MD;  Location: [place];  Service: Orthopedics;  Laterality: Left;    There were no vitals filed for this visit.      Subjective Assessment - 03/25/16 0802    Subjective Pt reports shoulder feels fine today. Was playing with dogs last night. Reports feeling aching late into the day.    Patient Stated Goals hunting, fishing, dogs   Currently in Pain? Yes   Pain Score 2    Pain Location Shoulder   Pain Orientation Left   Pain Descriptors / Indicators Aching            [facility] PT Assessment - 03/25/16 0001      Strength   Overall Strength Comments Able to lift to 90 in flexion and scaption without shoulder hike, limited by scapular winging                     [facility] Adult PT Treatment/Exercise - 03/25/16 0001      Shoulder Exercises: Supine   Protraction Limitations serratus  punch with wand in hands   Internal Rotation Limitations scapular plane IR/ER resisted by PT   Flexion Limitations flexion with wand   Other Supine Exercises rhythmic stabs     Shoulder Exercises: Prone   Retraction Limitations paired with ext lift 5s holds     Shoulder Exercises: Sidelying   External Rotation 20 reps   External Rotation Limitations against gravity, towel under elbow   Flexion 15 reps   Other Sidelying Exercises sidelying resisted scapular retraction     Manual Therapy   Passive ROM scaption, ER & IR in scaption, scapular mobility in scaption                PT Education - 03/25/16 0851    Education provided Yes   Education Details exercise form/rationale, HEP, avoiding movements for risk of dislocation   Person(s) Educated Patient   Methods Explanation;Demonstration;Tactile cues;Verbal cues;Handout   Comprehension Verbalized understanding;Returned demonstration;Verbal cues required;Tactile cues required;Need further instruction          PT Short Term Goals - 03/25/16 0850      PT SHORT TERM GOAL #1   Title Pt will demo AROM flexion to 90 deg without L GHJ elevation indicating improved scapular  control by 3/16   Baseline able to demo   Status Achieved     PT SHORT TERM GOAL #2   Title Pt will verbalize improved postural awareness, being cognisant of scapular retraction during functional activities   Baseline pt reports he knows what he is supposed to do and is able to demo but reports he gets comfortable and slouches   Status Partially Met           PT Long Term Goals - 02/27/16 0953      PT LONG TERM GOAL #1   Title Pt will be able to reach for and lift objects to high cabinets in the house without pain or limitation by GHJ by 5/18   Baseline limited by ROM and strength at evaluation   Time 12   Period Weeks   Status New     PT LONG TERM GOAL #2   Title Pt will demo 5/5 MMT in all shoulder movements to indicate proper support to  shoulder joint   Baseline unable to test at eval due to precautions   Time 12   Period Weeks   Status New     PT LONG TERM GOAL #3   Title Pt will demo good scapular control in reaching and lifting using L arm to improve functional ability   Baseline excessive scapular mobility at eval resulting in poor mechanics    Time 12   Period Weeks   Status New     PT LONG TERM GOAL #4   Title Pt will be able to return to fishing and hunting activities and verbalize knowledge of postural considerations to return to PLOF.    Baseline will progress toward these activities as tolerated   Time 12   Period Weeks   Status New               Plan - 03-28-16 0847    Clinical Impression Statement Pt tolerated exercises well. Quivering noted in periscapular musculature when utilization of upper trap was reduced. Educated pt on risk of dislocation in early phases if he is not careful with activities. Pt denied pain other than days like to day when weather is bad.    PT Next Visit Plan PROM, AROM with cues for scapular control- see protocol   PT Home Exercise Plan scapular retraction/postural awareness, scaption AROM in Mirror, supine AAROM flexion, isometrics, AAROM ER, prone retraction +ext,    Consulted and Agree with Plan of Care Patient      Patient will benefit from skilled therapeutic intervention in order to improve the following deficits and impairments:     Visit Diagnosis: Muscle weakness (generalized)  Stiffness of left shoulder, not elsewhere classified  Acute pain of left shoulder       G-Codes - 2016-03-28 0844    Functional Assessment Tool Used (Outpatient Only) clinical judgement, ROM, strength (see flowhseet)   Functional Limitation Carrying, moving and handling objects   Carrying, Moving and Handling Objects Current Status (O7121) At least 60 percent but less than 80 percent impaired, limited or restricted   Carrying, Moving and Handling Objects Goal Status (F7588) At  least 20 percent but less than 40 percent impaired, limited or restricted      Problem List Patient Active Problem List   Diagnosis Date Noted  . Back pain 01/14/2016  . History of lumbar surgery 01/14/2016  . Essential hypertension 01/14/2016  . Hx of renal calculi 01/14/2016  . Chronic pain 01/14/2016  . Primary osteoarthritis, left  shoulder 01/14/2016   Nikolina Simerson C. Carlette Palmatier PT, DPT 03/25/16 8:54 AM   [facility] [address] [place], [place], [address] Phone: [phone]   Fax:  [phone]  Name: DESHANE COTRONEO MRN: [id number] Date of Birth: Mar 15, 1955

## 2016-03-27 ENCOUNTER — Ambulatory Visit: Payer: Medicare Other | Admitting: Physical Therapy

## 2016-03-27 DIAGNOSIS — M25512 Pain in left shoulder: Secondary | ICD-10-CM | POA: Diagnosis not present

## 2016-03-27 DIAGNOSIS — M6281 Muscle weakness (generalized): Secondary | ICD-10-CM | POA: Diagnosis not present

## 2016-03-27 DIAGNOSIS — M25612 Stiffness of left shoulder, not elsewhere classified: Secondary | ICD-10-CM | POA: Diagnosis not present

## 2016-03-27 NOTE — Therapy (Signed)
[place], [place], [address] Phone: [phone]   Fax:  [phone]  Physical Therapy Treatment  Patient Details  Name: Samuel Gibson MRN: [id number] Date of Birth: [birth year] Referring Provider: Edmonia Lynch, MD  Encounter Date: 03/27/2016      PT End of Session - 03/27/16 0849    Visit Number 6   Number of Visits 21   Date for PT Re-Evaluation 05/08/16   Authorization Type MCR   PT Start Time 0845   PT Stop Time 0925   PT Time Calculation (min) 40 min      Past Medical History:  Diagnosis Date  . Anxiety   . Arthritis   . Degenerative arthritis of lumbar spine   . Hernia of pelvic floor   . History of kidney stones   . Hypertension     Past Surgical History:  Procedure Laterality Date  . BACK SURGERY    . SHOULDER SURGERY    . 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 TOTAL SHOULDER ARTHROPLASTY Left 01/29/2016   Procedure: TOTAL SHOULDER ARTHROPLASTY;  Surgeon: Renette Butters, MD;  Location: [facility];  Service: Orthopedics;  Laterality: Left;    There were no vitals filed for this visit.      Subjective Assessment - 03/27/16 0848    Currently in Pain? Yes   Pain Score 2    Pain Location Shoulder   Pain Orientation Left   Pain Descriptors / Indicators Dull  thud   Pain Type Surgical pain   Aggravating Factors  reaching up high, weather   Pain Relieving Factors rest                         [facility] Adult PT Treatment/Exercise - 03/27/16 0001      Shoulder Exercises: Supine   Protraction Limitations serratus punch with wand in hands   Flexion Limitations flexion with wand   Other Supine Exercises --     Shoulder Exercises: Prone   Retraction 20 reps   Retraction Limitations paired with ext lift 5s holds     Shoulder Exercises: Sidelying   External Rotation 20 reps   External Rotation Limitations against gravity, towel under elbow   Other Sidelying  Exercises sidelying resisted scapular retraction     Shoulder Exercises: Standing   Flexion Limitations scaption bilateral x 20 with scapular guidance      Shoulder Exercises: Pulleys   Flexion 1 minute     Shoulder Exercises: ROM/Strengthening   Other ROM/Strengthening Exercises supie clasped hand AAROM flexion pullovers, also ER AAROM with cane 5 sec x 10    Other ROM/Strengthening Exercises UE ranger standing, fully extended, AAROM flexion , horizontal abdct/addct, small ROM. Began with UE ranger on #12 o wall for flexion reaching with step, no cincreased pain     Manual Therapy   Joint Mobilization grade 2 post & inf mob, sidelying scapular mobs with PROM    Passive ROM Flexion and ER                   PT Short Term Goals - 03/25/16 0850      PT SHORT TERM GOAL #1   Title Pt will demo AROM flexion to 90 deg without L GHJ elevation indicating improved scapular control by 3/16   Baseline able to demo   Status Achieved     PT SHORT TERM GOAL #2   Title Pt will verbalize improved  postural awareness, being cognisant of scapular retraction during functional activities   Baseline pt reports he knows what he is supposed to do and is able to demo but reports he gets comfortable and slouches   Status Partially Met           PT Long Term Goals - 02/27/16 0953      PT LONG TERM GOAL #1   Title Pt will be able to reach for and lift objects to high cabinets in the house without pain or limitation by GHJ by 5/18   Baseline limited by ROM and strength at evaluation   Time 12   Period Weeks   Status New     PT LONG TERM GOAL #2   Title Pt will demo 5/5 MMT in all shoulder movements to indicate proper support to shoulder joint   Baseline unable to test at eval due to precautions   Time 12   Period Weeks   Status New     PT LONG TERM GOAL #3   Title Pt will demo good scapular control in reaching and lifting using L arm to improve functional ability   Baseline excessive  scapular mobility at eval resulting in poor mechanics    Time 12   Period Weeks   Status New     PT LONG TERM GOAL #4   Title Pt will be able to return to fishing and hunting activities and verbalize knowledge of postural considerations to return to PLOF.    Baseline will progress toward these activities as tolerated   Time 12   Period Weeks   Status New               Plan - 03/27/16 1045    Clinical Impression Statement Pt reports no pain today. His ER is neutral. After PROM recovered about 20 degrees. Asked him to work more on this at home with the [facility] wand exercise. Continued prone scapular strength and reviewed last HEP issued. Pt reports increased pain with reaching into high cabinet this morning. Progressing toward LTGs.    PT Next Visit Plan PROM, AROM with cues for scapular control- see protocol   PT Home Exercise Plan scapular retraction/postural awareness, scaption AROM in Mirror, supine AAROM flexion, isometrics, AAROM ER, prone retraction +ext,    Consulted and Agree with Plan of Care Patient      Patient will benefit from skilled therapeutic intervention in order to improve the following deficits and impairments:  Impaired UE functional use, Increased muscle spasms, Decreased range of motion, Pain, Decreased activity tolerance, Impaired flexibility, Improper body mechanics, Postural dysfunction, Decreased strength, Decreased mobility  Visit Diagnosis: Muscle weakness (generalized)  Stiffness of left shoulder, not elsewhere classified  Acute pain of left shoulder     Problem List Patient Active Problem List   Diagnosis Date Noted  . Back pain 01/14/2016  . History of lumbar surgery 01/14/2016  . Essential hypertension 01/14/2016  . Hx of renal calculi 01/14/2016  . Chronic pain 01/14/2016  . Primary osteoarthritis, left shoulder 01/14/2016    Dorene Ar, PTA 03/27/2016, 11:23 AM  [facility] [address] [place], [place], [address] Phone: [phone]   Fax:  [phone]  Name: Samuel Gibson MRN: [id number] Date of Birth: 1955-05-10

## 2016-04-01 ENCOUNTER — Ambulatory Visit: Payer: Medicare Other | Admitting: Physical Therapy

## 2016-04-01 DIAGNOSIS — M25512 Pain in left shoulder: Secondary | ICD-10-CM | POA: Diagnosis not present

## 2016-04-01 DIAGNOSIS — M6281 Muscle weakness (generalized): Secondary | ICD-10-CM

## 2016-04-01 DIAGNOSIS — M25612 Stiffness of left shoulder, not elsewhere classified: Secondary | ICD-10-CM

## 2016-04-01 NOTE — Patient Instructions (Signed)
ROM: External Rotation (Alternate)    Keep palm of left hand against door frame and elbow bent at 90. Turn body from fixed hand until stretch is felt. Hold __30__ seconds. Repeat __3__ times per set. Do ___1_ sets per session. Do __2__ sessions per day.  http://orth.exer.us/764   Copyright  VHI. All rights reserved.

## 2016-04-01 NOTE — Therapy (Signed)
[place], [place], [address] Phone: [phone]   Fax:  [phone]  Physical Therapy Treatment  Patient Details  Name: Samuel Gibson MRN: [id number] Date of Birth: Aug 06, 1955 Referring Provider: Edmonia Lynch, MD  Encounter Date: 04/01/2016      PT End of Session - 04/01/16 0814    Visit Number 7   Number of Visits 21   Date for PT Re-Evaluation 05/08/16   Authorization Type MCR   PT Start Time 0800   PT Stop Time 0845   PT Time Calculation (min) 45 min      Past Medical History:  Diagnosis Date  . Anxiety   . Arthritis   . Degenerative arthritis of lumbar spine   . Hernia of pelvic floor   . History of kidney stones   . Hypertension     Past Surgical History:  Procedure Laterality Date  . BACK SURGERY    . SHOULDER SURGERY    . 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 TOTAL SHOULDER ARTHROPLASTY Left 01/29/2016   Procedure: TOTAL SHOULDER ARTHROPLASTY;  Surgeon: Renette Butters, MD;  Location: [facility];  Service: Orthopedics;  Laterality: Left;    There were no vitals filed for this visit.      Subjective Assessment - 04/01/16 0843    Subjective Shoulder feels fine. Using it more.             [facility] PT Assessment - 04/01/16 0001      PROM   Left Shoulder Flexion 125 Degrees   Left Shoulder External Rotation 20 Degrees                     [facility] Adult PT Treatment/Exercise - 04/01/16 0001      Shoulder Exercises: Supine   Protraction Limitations serratus punch 2#   Flexion Limitations AROM   Other Supine Exercises rhythmic stabs   Other Supine Exercises Bicep curls 2#, 3# x 15 each, tricep press supine 2# x 15      Shoulder Exercises: Sidelying   External Rotation 20 reps   External Rotation Limitations against gravity, towel under elbow   Flexion 15 reps   Other Sidelying Exercises sidelying resisted scapular retraction     Shoulder Exercises: Pulleys    Flexion 2 minutes     Shoulder Exercises: Isometric Strengthening   Flexion 5X10"   Extension 5X10"   Internal Rotation 5X10"     Shoulder Exercises: Stretch   Other Shoulder Stretches unilateral ER stretch neutral 30 sec x 3      Manual Therapy   Joint Mobilization grade 2 post & inf mob, sidelying scapular mobs with PROM    Passive ROM Flexion and ER                 PT Education - 04/01/16 0855    Education provided Yes   Education Details HEP   Person(s) Educated Patient   Methods Explanation;Handout   Comprehension Verbalized understanding          PT Short Term Goals - 03/25/16 0850      PT SHORT TERM GOAL #1   Title Pt will demo AROM flexion to 90 deg without L GHJ elevation indicating improved scapular control by 3/16   Baseline able to demo   Status Achieved     PT SHORT TERM GOAL #2   Title Pt will verbalize improved postural awareness, being cognisant of scapular retraction during functional activities  Baseline pt reports he knows what he is supposed to do and is able to demo but reports he gets comfortable and slouches   Status Partially Met           PT Long Term Goals - 02/27/16 0953      PT LONG TERM GOAL #1   Title Pt will be able to reach for and lift objects to high cabinets in the house without pain or limitation by GHJ by 5/18   Baseline limited by ROM and strength at evaluation   Time 12   Period Weeks   Status New     PT LONG TERM GOAL #2   Title Pt will demo 5/5 MMT in all shoulder movements to indicate proper support to shoulder joint   Baseline unable to test at eval due to precautions   Time 12   Period Weeks   Status New     PT LONG TERM GOAL #3   Title Pt will demo good scapular control in reaching and lifting using L arm to improve functional ability   Baseline excessive scapular mobility at eval resulting in poor mechanics    Time 12   Period Weeks   Status New     PT LONG TERM GOAL #4   Title Pt will be  able to return to fishing and hunting activities and verbalize knowledge of postural considerations to return to PLOF.    Baseline will progress toward these activities as tolerated   Time 12   Period Weeks   Status New               Plan - 04/01/16 5176    Clinical Impression Statement Pt reports no pain. He reports a death of a friend which has limited his HEP compliance. He continues to demonstrate 0 degrees ER initially and then afet PROM he has about 20 degrees. Instructed pt in single arm doorway stretch and educated on the need for it to be a gentle stretch and to not over do it. Began PRE for elobw is supine with good tolerance. No increase pain post session. Declined modalities.    PT Next Visit Plan PROM, AROM with cues for scapular control- see protocol; FOTO; check goals    PT Home Exercise Plan scapular retraction/postural awareness, scaption AROM in Mirror, supine AAROM flexion, isometrics, AAROM ER, prone retraction +ext,    Consulted and Agree with Plan of Care Patient      Patient will benefit from skilled therapeutic intervention in order to improve the following deficits and impairments:  Impaired UE functional use, Increased muscle spasms, Decreased range of motion, Pain, Decreased activity tolerance, Impaired flexibility, Improper body mechanics, Postural dysfunction, Decreased strength, Decreased mobility  Visit Diagnosis: Muscle weakness (generalized)  Stiffness of left shoulder, not elsewhere classified  Acute pain of left shoulder     Problem List Patient Active Problem List   Diagnosis Date Noted  . Back pain 01/14/2016  . History of lumbar surgery 01/14/2016  . Essential hypertension 01/14/2016  . Hx of renal calculi 01/14/2016  . Chronic pain 01/14/2016  . Primary osteoarthritis, left shoulder 01/14/2016    Dorene Ar, PTA 04/01/2016, 9:36 AM  [place], [place], [address] Phone: [phone]   Fax:  [phone]  Name: ANGELINA NEECE MRN: [id number] Date of Birth: 06/24/1955

## 2016-04-02 ENCOUNTER — Ambulatory Visit: Payer: Medicare Other | Admitting: Physical Therapy

## 2016-04-02 DIAGNOSIS — M6281 Muscle weakness (generalized): Secondary | ICD-10-CM | POA: Diagnosis not present

## 2016-04-02 DIAGNOSIS — M25612 Stiffness of left shoulder, not elsewhere classified: Secondary | ICD-10-CM

## 2016-04-02 DIAGNOSIS — M25512 Pain in left shoulder: Secondary | ICD-10-CM | POA: Diagnosis not present

## 2016-04-02 NOTE — Therapy (Signed)
[place], [place], [address] Phone: [phone]   Fax:  [phone]  Physical Therapy Treatment  Patient Details  Name: Samuel Gibson MRN: [id number] Date of Birth: 30-Aug-1955 Referring Provider: Edmonia Lynch, MD  Encounter Date: 04/02/2016      PT End of Session - 04/02/16 0716    Visit Number 8   Number of Visits 21   Date for PT Re-Evaluation 05/08/16   Authorization Type MCR   PT Start Time 0715   PT Stop Time 0800   PT Time Calculation (min) 45 min      Past Medical History:  Diagnosis Date  . Anxiety   . Arthritis   . Degenerative arthritis of lumbar spine   . Hernia of pelvic floor   . History of kidney stones   . Hypertension     Past Surgical History:  Procedure Laterality Date  . BACK SURGERY    . SHOULDER SURGERY    . 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 TOTAL SHOULDER ARTHROPLASTY Left 01/29/2016   Procedure: TOTAL SHOULDER ARTHROPLASTY;  Surgeon: Renette Butters, MD;  Location: [facility];  Service: Orthopedics;  Laterality: Left;    There were no vitals filed for this visit.      Subjective Assessment - 04/02/16 0716    Subjective Normal morning stiffness, takes about an hour to get over it.    Currently in Pain? No/denies   Aggravating Factors  reaching, weather.    Pain Relieving Factors rest                          [facility] Adult PT Treatment/Exercise - 04/02/16 0001      Shoulder Exercises: Supine   Protraction Limitations serratus punch 2#   Internal Rotation Limitations scapular plane IR/ER resisted by PTA   Flexion Limitations AROM, bent and extended elbow    Other Supine Exercises rhythmic stabs   Other Supine Exercises Bicep curls , 3# x 25 each, tricep press supine 2# x 25     Shoulder Exercises: Prone   Retraction 20 reps   Retraction Limitations paired with ext lift 5s holds   Other Prone Exercises row x20     Shoulder Exercises:  Sidelying   External Rotation 20 reps   External Rotation Limitations against gravity, towel under elbow     Shoulder Exercises: Pulleys   Flexion 2 minutes     Shoulder Exercises: Isometric Strengthening   Flexion 5X10"   Extension 5X10"   External Rotation 5X10"   Internal Rotation 5X10"     Shoulder Exercises: Stretch   Other Shoulder Stretches unilateral ER stretch neutral 30 sec x 3      Manual Therapy   Joint Mobilization grade 2 post & inf mob, sidelying scapular mobs with PROM    Passive ROM Flexion and ER                 PT Education - 04/01/16 0855    Education provided Yes   Education Details HEP   Person(s) Educated Patient   Methods Explanation;Handout   Comprehension Verbalized understanding          PT Short Term Goals - 03/25/16 0850      PT SHORT TERM GOAL #1   Title Pt will demo AROM flexion to 90 deg without L GHJ elevation indicating improved scapular control by 3/16   Baseline able to demo   Status Achieved  PT SHORT TERM GOAL #2   Title Pt will verbalize improved postural awareness, being cognisant of scapular retraction during functional activities   Baseline pt reports he knows what he is supposed to do and is able to demo but reports he gets comfortable and slouches   Status Partially Met           PT Long Term Goals - 04/02/16 0831      PT LONG TERM GOAL #1   Title Pt will be able to reach for and lift objects to high cabinets in the house without pain or limitation by GHJ by 5/18   Baseline pain with high reaching    Time 12   Period Weeks   Status Partially Met     PT LONG TERM GOAL #2   Title Pt will demo 5/5 MMT in all shoulder movements to indicate proper support to shoulder joint   Time 12   Period Weeks   Status Unable to assess     PT LONG TERM GOAL #3   Title Pt will demo good scapular control in reaching and lifting using L arm to improve functional ability   Baseline improving  mechanics    Time 12    Period Weeks   Status On-going     PT LONG TERM GOAL #4   Title Pt will be able to return to fishing and hunting activities and verbalize knowledge of postural considerations to return to PLOF.    Baseline will progress toward these activities as tolerated   Time 12   Period Weeks   Status On-going               Plan - 04/02/16 0831    Clinical Impression Statement Pt is using left arm to assist with functional activities. He has pain with high reaching. He has morning stiffness that resolves after 1 hour. He is progressing within protocol and is slowly gaining flexion and ER ROM. Slowly progressing toward LTGs.    PT Next Visit Plan PROM, AROM with cues for scapular control- see protocol;  NO FOTO AT EVAL    PT Home Exercise Plan scapular retraction/postural awareness, scaption AROM in Mirror, supine AAROM flexion, isometrics, AAROM ER, prone retraction +ext,    Consulted and Agree with Plan of Care Patient      Patient will benefit from skilled therapeutic intervention in order to improve the following deficits and impairments:  Impaired UE functional use, Increased muscle spasms, Decreased range of motion, Pain, Decreased activity tolerance, Impaired flexibility, Improper body mechanics, Postural dysfunction, Decreased strength, Decreased mobility  Visit Diagnosis: Muscle weakness (generalized)  Stiffness of left shoulder, not elsewhere classified  Acute pain of left shoulder     Problem List Patient Active Problem List   Diagnosis Date Noted  . Back pain 01/14/2016  . History of lumbar surgery 01/14/2016  . Essential hypertension 01/14/2016  . Hx of renal calculi 01/14/2016  . Chronic pain 01/14/2016  . Primary osteoarthritis, left shoulder 01/14/2016    Dorene Ar, PTA 04/02/2016, 8:34 AM  [facility] [address] [place], [place], [address] Phone: [phone]   Fax:  [phone]  Name: DORON SHAKE MRN: [id number] Date of Birth: May 17, 1955

## 2016-04-07 ENCOUNTER — Ambulatory Visit: Payer: Medicare Other | Attending: Orthopedic Surgery | Admitting: Physical Therapy

## 2016-04-07 DIAGNOSIS — M25612 Stiffness of left shoulder, not elsewhere classified: Secondary | ICD-10-CM | POA: Diagnosis not present

## 2016-04-07 DIAGNOSIS — M25512 Pain in left shoulder: Secondary | ICD-10-CM | POA: Insufficient documentation

## 2016-04-07 DIAGNOSIS — M6281 Muscle weakness (generalized): Secondary | ICD-10-CM | POA: Insufficient documentation

## 2016-04-07 NOTE — Therapy (Signed)
[place], [place], [address] Phone: [phone]   Fax:  [phone]  Physical Therapy Treatment  Patient Details  Name: Samuel Gibson MRN: [id number] Date of Birth: [birth year] Referring Provider: Edmonia Lynch, MD  Encounter Date: 04/07/2016      PT End of Session - 04/07/16 0716    Visit Number 9   Number of Visits 21   Date for PT Re-Evaluation 05/08/16   Authorization Type MCR   PT Start Time 0715   PT Stop Time 1761   PT Time Calculation (min) 40 min      Past Medical History:  Diagnosis Date  . Anxiety   . Arthritis   . Degenerative arthritis of lumbar spine   . Hernia of pelvic floor   . History of kidney stones   . Hypertension     Past Surgical History:  Procedure Laterality Date  . BACK SURGERY    . SHOULDER SURGERY    . 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 TOTAL SHOULDER ARTHROPLASTY Left 01/29/2016   Procedure: TOTAL SHOULDER ARTHROPLASTY;  Surgeon: Renette Butters, MD;  Location: [facility];  Service: Orthopedics;  Laterality: Left;    There were no vitals filed for this visit.      Subjective Assessment - 04/07/16 0716    Currently in Pain? No/denies            [facility] PT Assessment - 04/07/16 0001      PROM   Left Shoulder Flexion 135 Degrees   Left Shoulder External Rotation 35 Degrees                     [facility] Adult PT Treatment/Exercise - 04/07/16 0001      Shoulder Exercises: Supine   Protraction Limitations serratus punch 3#   Internal Rotation Limitations scapular plane IR/ER resisted by PTA   Flexion Limitations AROM   Other Supine Exercises rhythmic stabs, supine flexion stretch    Other Supine Exercises standing Bicep curls , 4# x 25 each, tricep press supine 3# x 25     Shoulder Exercises: Sidelying   External Rotation 20 reps   External Rotation Limitations 2#, towel under elbow   ABduction 10 reps     Shoulder Exercises:  Standing   External Rotation 10 reps   Theraband Level (Shoulder External Rotation) Level 1 (Yellow)   Internal Rotation 10 reps   Theraband Level (Shoulder Internal Rotation) Level 1 (Yellow)   Row 20 reps   Theraband Level (Shoulder Row) Level 1 (Yellow)   Other Standing Exercises cabinet reaching 1# 2# top shelf x 10 each , ball on wall rolls at 90 degrees of flexion x 1 minute      Manual Therapy   Joint Mobilization grade 2 post & inf mob, sidelying scapular mobs with PROM    Passive ROM Flexion and ER                 PT Education - 04/07/16 0831    Education provided Yes   Education Details HEP   Person(s) Educated Patient   Methods Explanation;Handout   Comprehension Verbalized understanding          PT Short Term Goals - 03/25/16 0850      PT SHORT TERM GOAL #1   Title Pt will demo AROM flexion to 90 deg without L GHJ elevation indicating improved scapular control by 3/16   Baseline able to demo   Status  Achieved     PT SHORT TERM GOAL #2   Title Pt will verbalize improved postural awareness, being cognisant of scapular retraction during functional activities   Baseline pt reports he knows what he is supposed to do and is able to demo but reports he gets comfortable and slouches   Status Partially Met           PT Long Term Goals - 04/07/16 0817      PT LONG TERM GOAL #1   Title Pt will be able to reach for and lift objects to high cabinets in the house without pain or limitation by GHJ by 5/18   Time 12   Period Weeks   Status Achieved     PT LONG TERM GOAL #2   Title Pt will demo 5/5 MMT in all shoulder movements to indicate proper support to shoulder joint   Time 12   Period Weeks   Status Unable to assess     PT LONG TERM GOAL #3   Title Pt will demo good scapular control in reaching and lifting using L arm to improve functional ability   Baseline improving  mechanics    Time 12   Period Weeks   Status Partially Met     PT LONG TERM  GOAL #4   Title Pt will be able to return to fishing and hunting activities and verbalize knowledge of postural considerations to return to PLOF.    Time 12   Period Weeks   Status Unable to assess               Plan - 04/07/16 0818    Clinical Impression Statement Pt demonstrates improved ER ROM. He is able to perform yellow band ER/IR with good mechanics using towel roll under elbow. Issued for HEP. Performed flexion reaching with 1-2# weight without limitations by pain or ROM, abe to reach top shelf, multiple reps. LTG# 1 met, #3 partially Met.    PT Next Visit Plan PROM, AROM with cues for scapular control- see protocol;  NO FOTO AT EVAL    PT Home Exercise Plan scapular retraction/postural awareness, scaption AROM in Mirror, supine AAROM flexion, isometrics, AAROM ER, prone retraction +ext, Row, IR/ER with yellow    Consulted and Agree with Plan of Care Patient      Patient will benefit from skilled therapeutic intervention in order to improve the following deficits and impairments:  Impaired UE functional use, Increased muscle spasms, Decreased range of motion, Pain, Decreased activity tolerance, Impaired flexibility, Improper body mechanics, Postural dysfunction, Decreased strength, Decreased mobility  Visit Diagnosis: Muscle weakness (generalized)  Acute pain of left shoulder  Stiffness of left shoulder, not elsewhere classified     Problem List Patient Active Problem List   Diagnosis Date Noted  . Back pain 01/14/2016  . History of lumbar surgery 01/14/2016  . Essential hypertension 01/14/2016  . Hx of renal calculi 01/14/2016  . Chronic pain 01/14/2016  . Primary osteoarthritis, left shoulder 01/14/2016    Dorene Ar, PTA 04/07/2016, 8:34 AM  [facility] [address] [place], [place], [address] Phone: [phone]   Fax:  [phone]  Name: Samuel Gibson MRN: [id number] Date of Birth:  [birth year]

## 2016-04-07 NOTE — Patient Instructions (Signed)
  Resistive Band Rowing   With resistive band anchored in door, grasp both ends. Keeping elbows bent, pull back, squeezing shoulder blades together. Hold 5____ seconds. Repeat __20__ times. Do _2___ sessions per day.  Strengthening: Resisted Internal Rotation   Hold tubing in left hand, elbow at side and forearm out. Rotate forearm in across body. Repeat ___10-20_ times per set. Do __1-2__ sets per session. Do __2__ sessions per day.  http://orth.exer.us/830   Copyright  VHI. All rights reserved.  Strengthening: Resisted External Rotation   Hold tubing in right hand, elbow at side and forearm across body. Rotate forearm out. Repeat __10-20__ times per set. Do __1-2__ sets per session. Do _2___ sessions per day.

## 2016-04-09 ENCOUNTER — Ambulatory Visit: Payer: Medicare Other | Admitting: Physical Therapy

## 2016-04-14 ENCOUNTER — Telehealth: Payer: Self-pay | Admitting: Physical Therapy

## 2016-04-14 ENCOUNTER — Ambulatory Visit: Payer: Medicare Other | Admitting: Physical Therapy

## 2016-04-14 NOTE — Telephone Encounter (Signed)
Spoke to patient regarding no-show appointments. He had a family emergency. He plans to attend next visit.

## 2016-04-17 ENCOUNTER — Ambulatory Visit: Payer: Medicare Other | Admitting: Physical Therapy

## 2016-04-17 ENCOUNTER — Encounter: Payer: Self-pay | Admitting: Physical Therapy

## 2016-04-17 DIAGNOSIS — M25612 Stiffness of left shoulder, not elsewhere classified: Secondary | ICD-10-CM

## 2016-04-17 DIAGNOSIS — M6281 Muscle weakness (generalized): Secondary | ICD-10-CM | POA: Diagnosis not present

## 2016-04-17 DIAGNOSIS — M25512 Pain in left shoulder: Secondary | ICD-10-CM | POA: Diagnosis not present

## 2016-04-17 NOTE — Therapy (Signed)
[place], [place], [address] Phone: [phone]   Fax:  [phone]  Physical Therapy Treatment  Patient Details  Name: Samuel Gibson MRN: [id number] Date of Birth: [birth year] Referring Provider: Edmonia Lynch, MD  Encounter Date: 04/17/2016      PT End of Session - 04/17/16 0847    Visit Number 10   Number of Visits 21   Date for PT Re-Evaluation 05/08/16   Authorization Type MCR   PT Start Time 0847   PT Stop Time 0926   PT Time Calculation (min) 39 min   Activity Tolerance Patient tolerated treatment well   Behavior During Therapy [facility] for tasks assessed/performed      Past Medical History:  Diagnosis Date  . Anxiety   . Arthritis   . Degenerative arthritis of lumbar spine   . Hernia of pelvic floor   . History of kidney stones   . Hypertension     Past Surgical History:  Procedure Laterality Date  . BACK SURGERY    . SHOULDER SURGERY    . 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 TOTAL SHOULDER ARTHROPLASTY Left 01/29/2016   Procedure: TOTAL SHOULDER ARTHROPLASTY;  Surgeon: Renette Butters, MD;  Location: [facility];  Service: Orthopedics;  Laterality: Left;    There were no vitals filed for this visit.      Subjective Assessment - 04/17/16 0847    Subjective Pt reports L shoulder is feeling fine. Is beginning to have pain in R shoulder. Feels limited in lifting.    Patient Stated Goals hunting, fishing, dogs   Currently in Pain? No/denies   Aggravating Factors  lifting   Pain Relieving Factors rest, pain meds            [facility] PT Assessment - 04/17/16 0001      Sensation   Additional Comments WFL     AROM   Left Shoulder Flexion 109 Degrees   Left Shoulder ABduction 105 Degrees     PROM   Left Shoulder Flexion 125 Degrees   Left Shoulder ABduction 105 Degrees   Left Shoulder External Rotation 30 Degrees  at 70 deg abd     Strength   Overall Strength Comments gross  strength 3-5 due to inability to move through full avail PROM     Palpation   Palpation comment good joint mobility, tightness with springy end feel in all ranges, denies TTP                     [facility] Adult PT Treatment/Exercise - 04/17/16 0001      Shoulder Exercises: Prone   Other Prone Exercises prone row x20 2#     Shoulder Exercises: Sidelying   External Rotation 20 reps   External Rotation Limitations 3# towel under elbow, PT assist for positioning   Flexion 20 reps  2# sidelying punch fwd   ABduction 20 reps   ABduction Limitations 2#     Shoulder Exercises: Standing   Protraction Limitations hands on table   Flexion Limitations flexion wall slide to lift off     Shoulder Exercises: ROM/Strengthening   UBE (Upper Arm Bike) retro L1.5 5 min                PT Education - 04/17/16 1001    Education provided Yes   Education Details exercise form/rationale, importance of being aware of scapular winging, precautions/limitations with GHJ and rationale   Person(s) Educated Patient  Methods Explanation;Demonstration;Tactile cues;Verbal cues   Comprehension Verbalized understanding;Returned demonstration;Verbal cues required;Tactile cues required;Need further instruction          PT Short Term Goals - 12-May-2016 0851      PT SHORT TERM GOAL #1   Title Pt will demo AROM flexion to 90 deg without L GHJ elevation indicating improved scapular control by 3/16   Baseline able to demo   Status Achieved     PT SHORT TERM GOAL #2   Title Pt will verbalize improved postural awareness, being cognisant of scapular retraction during functional activities   Baseline good control   Status Achieved           PT Long Term Goals - 04/07/16 0817      PT LONG TERM GOAL #1   Title Pt will be able to reach for and lift objects to high cabinets in the house without pain or limitation by GHJ by 5/18   Time 12   Period Weeks   Status Achieved     PT LONG TERM  GOAL #2   Title Pt will demo 5/5 MMT in all shoulder movements to indicate proper support to shoulder joint   Time 12   Period Weeks   Status Unable to assess     PT LONG TERM GOAL #3   Title Pt will demo good scapular control in reaching and lifting using L arm to improve functional ability   Baseline improving  mechanics    Time 12   Period Weeks   Status Partially Met     PT LONG TERM GOAL #4   Title Pt will be able to return to fishing and hunting activities and verbalize knowledge of postural considerations to return to PLOF.    Time 12   Period Weeks   Status Unable to assess               Plan - 05-12-16 1002    Clinical Impression Statement Pt has made significant improvement in ROM of shoulder. Good scapular control noted in overhead movements but winging at lower ranges. Decreased cavitation with scapular distraction to decrease subcap tightness. Pt reports he is wrestling with his large dogs at home and was advised to protect L arm. Discussed increasing functional use of L arm such as using L to put dishes away in higher cabinets and being aware of scapular control at lower ranges of motion. Will continue to benefit from guided stretching and strengthening to reach long term functional goals.    PT Treatment/Interventions ADLs/Self Care Home Management;Cryotherapy;Electrical Stimulation;Iontophoresis 82m/ml Dexamethasone;Moist Heat;Ultrasound;Therapeutic activities;Therapeutic exercise;Neuromuscular re-education;Patient/family education;Passive range of motion;Scar mobilization;Manual techniques;Dry needling;Taping;Vasopneumatic Device   PT Next Visit Plan PROM, AROM with cues for scapular control- see protocol   PT Home Exercise Plan scapular retraction/postural awareness, scaption AROM in Mirror, supine AAROM flexion, isometrics, AAROM ER, prone retraction +ext, Row, IR/ER with yellow; protraction at counter   Consulted and Agree with Plan of Care Patient       Patient will benefit from skilled therapeutic intervention in order to improve the following deficits and impairments:  Impaired UE functional use, Increased muscle spasms, Decreased range of motion, Pain, Decreased activity tolerance, Impaired flexibility, Improper body mechanics, Postural dysfunction, Decreased strength, Decreased mobility  Visit Diagnosis: Muscle weakness (generalized)  Acute pain of left shoulder  Stiffness of left shoulder, not elsewhere classified       G-Codes - 0May 07, 20181006    Functional Assessment Tool Used (Outpatient Only) clinical judgement, ROM, strength (  see flowhseet)   Functional Limitation Carrying, moving and handling objects   Carrying, Moving and Handling Objects Current Status [phone]) At least 40 percent but less than 60 percent impaired, limited or restricted   Carrying, Moving and Handling Objects Goal Status (O0321) At least 20 percent but less than 40 percent impaired, limited or restricted      Problem List Patient Active Problem List   Diagnosis Date Noted  . Back pain 01/14/2016  . History of lumbar surgery 01/14/2016  . Essential hypertension 01/14/2016  . Hx of renal calculi 01/14/2016  . Chronic pain 01/14/2016  . Primary osteoarthritis, left shoulder 01/14/2016   Dimarco Minkin C. Mica Releford PT, DPT 04/17/16 10:07 AM   [facility] [address] [place], [place], [address] Phone: [phone]   Fax:  [phone]  Name: Samuel Gibson MRN: [id number] Date of Birth: December 21, 1955

## 2016-04-21 ENCOUNTER — Ambulatory Visit: Payer: Medicare Other | Admitting: Physical Therapy

## 2016-04-21 DIAGNOSIS — M6281 Muscle weakness (generalized): Secondary | ICD-10-CM

## 2016-04-21 DIAGNOSIS — M25512 Pain in left shoulder: Secondary | ICD-10-CM | POA: Diagnosis not present

## 2016-04-21 DIAGNOSIS — M25612 Stiffness of left shoulder, not elsewhere classified: Secondary | ICD-10-CM

## 2016-04-21 NOTE — Therapy (Signed)
[place], [place], [address] Phone: [phone]   Fax:  [phone]  Physical Therapy Treatment  Patient Details  Name: Samuel Gibson MRN: [id number] Date of Birth: 05-16-1955 Referring Provider: Edmonia Lynch, MD  Encounter Date: 04/21/2016      PT End of Session - 04/21/16 0718    Visit Number 11   Number of Visits 21   Date for PT Re-Evaluation 05/08/16   Authorization Type MCR   PT Start Time 0715   PT Stop Time 0355   PT Time Calculation (min) 40 min      Past Medical History:  Diagnosis Date  . Anxiety   . Arthritis   . Degenerative arthritis of lumbar spine   . Hernia of pelvic floor   . History of kidney stones   . Hypertension     Past Surgical History:  Procedure Laterality Date  . BACK SURGERY    . SHOULDER SURGERY    . 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 TOTAL SHOULDER ARTHROPLASTY Left 01/29/2016   Procedure: TOTAL SHOULDER ARTHROPLASTY;  Surgeon: Renette Butters, MD;  Location: [facility];  Service: Orthopedics;  Laterality: Left;    There were no vitals filed for this visit.      Subjective Assessment - 04/21/16 0718    Currently in Pain? No/denies                         [facility] Adult PT Treatment/Exercise - 04/21/16 0001      Shoulder Exercises: Prone   Retraction Limitations paired with ext lift 5s holds  3#    Horizontal ABduction 1 10 reps   Other Prone Exercises prone row x20 3#     Shoulder Exercises: Sidelying   External Rotation 20 reps   External Rotation Limitations 3# towel under elbow, PT assist for positioning   Flexion 20 reps  2# sidelying punch fwd   ABduction 20 reps   ABduction Limitations 2#     Shoulder Exercises: Standing   Flexion Limitations flexion wall slide to lift off   Row 20 reps   Theraband Level (Shoulder Row) Level 3 (Green)   Other Standing Exercises cabinet reaching 1# 2# top shelf x 10 each , ball on  wall rolls at 90 degrees of flexion x 1 minute      Shoulder Exercises: Pulleys   Flexion 2 minutes     Shoulder Exercises: ROM/Strengthening   UBE (Upper Arm Bike) L1 x 4 min forward 2 min back      Manual Therapy   Joint Mobilization grade 2 post & inf mob, sidelying scapular mobs with PROM    Passive ROM Flexion and ER                   PT Short Term Goals - 04/17/16 0851      PT SHORT TERM GOAL #1   Title Pt will demo AROM flexion to 90 deg without L GHJ elevation indicating improved scapular control by 3/16   Baseline able to demo   Status Achieved     PT SHORT TERM GOAL #2   Title Pt will verbalize improved postural awareness, being cognisant of scapular retraction during functional activities   Baseline good control   Status Achieved           PT Long Term Goals - 04/07/16 0817      PT LONG TERM GOAL #1  Title Pt will be able to reach for and lift objects to high cabinets in the house without pain or limitation by GHJ by 5/18   Time 12   Period Weeks   Status Achieved     PT LONG TERM GOAL #2   Title Pt will demo 5/5 MMT in all shoulder movements to indicate proper support to shoulder joint   Time 12   Period Weeks   Status Unable to assess     PT LONG TERM GOAL #3   Title Pt will demo good scapular control in reaching and lifting using L arm to improve functional ability   Baseline improving  mechanics    Time 12   Period Weeks   Status Partially Met     PT LONG TERM GOAL #4   Title Pt will be able to return to fishing and hunting activities and verbalize knowledge of postural considerations to return to PLOF.    Time 12   Period Weeks   Status Unable to assess               Plan - 04/21/16 0802    Clinical Impression Statement Continued focus on shoulder/scapular strength and ROM. Pt denies HEP compliance however has increased functional use of L UE.    PT Next Visit Plan PROM, AROM with cues for scapular control- see protocol    PT Home Exercise Plan scapular retraction/postural awareness, scaption AROM in Mirror, supine AAROM flexion, isometrics, AAROM ER, prone retraction +ext, Row, IR/ER with yellow; protraction at counter   Consulted and Agree with Plan of Care Patient      Patient will benefit from skilled therapeutic intervention in order to improve the following deficits and impairments:  Impaired UE functional use, Increased muscle spasms, Decreased range of motion, Pain, Decreased activity tolerance, Impaired flexibility, Improper body mechanics, Postural dysfunction, Decreased strength, Decreased mobility  Visit Diagnosis: Muscle weakness (generalized)  Acute pain of left shoulder  Stiffness of left shoulder, not elsewhere classified     Problem List Patient Active Problem List   Diagnosis Date Noted  . Back pain 01/14/2016  . History of lumbar surgery 01/14/2016  . Essential hypertension 01/14/2016  . Hx of renal calculi 01/14/2016  . Chronic pain 01/14/2016  . Primary osteoarthritis, left shoulder 01/14/2016    Dorene Ar, PTA 04/21/2016, 8:05 AM  [facility] [address] [place], [place], [address] Phone: [phone]   Fax:  [phone]  Name: Samuel Gibson MRN: [id number] Date of Birth: [birth year]

## 2016-04-23 DIAGNOSIS — M19012 Primary osteoarthritis, left shoulder: Secondary | ICD-10-CM | POA: Diagnosis not present

## 2016-04-24 ENCOUNTER — Encounter: Payer: Self-pay | Admitting: Physical Therapy

## 2016-04-24 ENCOUNTER — Ambulatory Visit: Payer: Medicare Other | Admitting: Physical Therapy

## 2016-04-24 DIAGNOSIS — M25512 Pain in left shoulder: Secondary | ICD-10-CM

## 2016-04-24 DIAGNOSIS — M25612 Stiffness of left shoulder, not elsewhere classified: Secondary | ICD-10-CM | POA: Diagnosis not present

## 2016-04-24 DIAGNOSIS — M6281 Muscle weakness (generalized): Secondary | ICD-10-CM | POA: Diagnosis not present

## 2016-04-24 NOTE — Therapy (Signed)
[place], [place], [address] Phone: [phone]   Fax:  [phone]  Physical Therapy Treatment  Patient Details  Name: Samuel Gibson MRN: [id number] Date of Birth: March 29, 1955 Referring Provider: Edmonia Lynch, MD  Encounter Date: 04/24/2016      PT End of Session - 04/24/16 0813    Visit Number 12   Number of Visits 21   Date for PT Re-Evaluation 05/08/16   Authorization Type MCR   PT Start Time 0715   PT Stop Time 0800   PT Time Calculation (min) 45 min   Activity Tolerance Patient tolerated treatment well   Behavior During Therapy [facility] for tasks assessed/performed      Past Medical History:  Diagnosis Date  . Anxiety   . Arthritis   . Degenerative arthritis of lumbar spine   . Hernia of pelvic floor   . History of kidney stones   . Hypertension     Past Surgical History:  Procedure Laterality Date  . BACK SURGERY    . SHOULDER SURGERY    . 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 TOTAL SHOULDER ARTHROPLASTY Left 01/29/2016   Procedure: TOTAL SHOULDER ARTHROPLASTY;  Surgeon: Renette Butters, MD;  Location: [facility];  Service: Orthopedics;  Laterality: Left;    There were no vitals filed for this visit.      Subjective Assessment - 04/24/16 0718    Subjective Pt reports L shoulder is pain free. He reports he has been doing his exercises by doing yardwork and chasing the pigs.   Aggravating Factors  way he sleeps   Pain Relieving Factors rest, pain meds                         [facility] Adult PT Treatment/Exercise - 04/24/16 0001      Shoulder Exercises: Prone   Retraction 20 reps   Retraction Limitations paired with ext lift 5s holds  3#    Horizontal ABduction 1 20 reps   Horizontal ABduction 1 Weight (lbs) 3lbs   Other Prone Exercises prone row x20 3#     Shoulder Exercises: Sidelying   External Rotation 20 reps   External Rotation Limitations 3# towel under  elbow, PT assist for positioning   Internal Rotation 20 reps;Left   Internal Rotation Weight (lbs) 2 lbs   Flexion 20 reps  2# sidelying punch fwd   ABduction 20 reps   ABduction Limitations 3 lbs     Shoulder Exercises: Standing   Flexion Limitations flexion wall slide to lift off; 20 reps   Row 20 reps   Theraband Level (Shoulder Row) Level 3 (Green)   Other Standing Exercises cabinet reaching 1# 2# top shelf x 10 each , ball on wall rolls at 90 degrees of flexion x 1 minute      Shoulder Exercises: Pulleys   Flexion 2 minutes     Shoulder Exercises: ROM/Strengthening   UBE (Upper Arm Bike) L 1.2 x 4 minutes forward, 3 minutes backward                PT Education - 04/24/16 0809    Education provided Yes   Education Details exercise form/rationale, HEP   Person(s) Educated Patient   Methods Explanation;Demonstration   Comprehension Verbalized understanding;Returned demonstration          PT Short Term Goals - 04/17/16 0851      PT SHORT TERM GOAL #1  Title Pt will demo AROM flexion to 90 deg without L GHJ elevation indicating improved scapular control by 3/16   Baseline able to demo   Status Achieved     PT SHORT TERM GOAL #2   Title Pt will verbalize improved postural awareness, being cognisant of scapular retraction during functional activities   Baseline good control   Status Achieved           PT Long Term Goals - 04/07/16 0817      PT LONG TERM GOAL #1   Title Pt will be able to reach for and lift objects to high cabinets in the house without pain or limitation by GHJ by 5/18   Time 12   Period Weeks   Status Achieved     PT LONG TERM GOAL #2   Title Pt will demo 5/5 MMT in all shoulder movements to indicate proper support to shoulder joint   Time 12   Period Weeks   Status Unable to assess     PT LONG TERM GOAL #3   Title Pt will demo good scapular control in reaching and lifting using L arm to improve functional ability   Baseline  improving  mechanics    Time 12   Period Weeks   Status Partially Met     PT LONG TERM GOAL #4   Title Pt will be able to return to fishing and hunting activities and verbalize knowledge of postural considerations to return to PLOF.    Time 12   Period Weeks   Status Unable to assess               Plan - 04/24/16 0814    Clinical Impression Statement Continued focus on shoulder/scapular strength and ROM. Pt is not HEP compliant. Was able to complete all exercises with no increase in pain. Required min cues for form for prone and side-lying exercises. Discussed importance of keeping shoulders down and back while performing various ADLs.    Rehab Potential Good   PT Treatment/Interventions ADLs/Self Care Home Management;Cryotherapy;Electrical Stimulation;Iontophoresis 28m/ml Dexamethasone;Moist Heat;Ultrasound;Therapeutic activities;Therapeutic exercise;Neuromuscular re-education;Patient/family education;Passive range of motion;Scar mobilization;Manual techniques;Dry needling;Taping;Vasopneumatic Device   PT Next Visit Plan PROM, AROM with cues for scapular control- see protocol   PT Home Exercise Plan scapular retraction/postural awareness, scaption AROM in Mirror, supine AAROM flexion, isometrics, AAROM ER, prone retraction +ext, Row, IR/ER with yellow; protraction at counter   Consulted and Agree with Plan of Care Patient      Patient will benefit from skilled therapeutic intervention in order to improve the following deficits and impairments:  Impaired UE functional use, Increased muscle spasms, Decreased range of motion, Pain, Decreased activity tolerance, Impaired flexibility, Improper body mechanics, Postural dysfunction, Decreased strength, Decreased mobility  Visit Diagnosis: Muscle weakness (generalized)  Acute pain of left shoulder  Stiffness of left shoulder, not elsewhere classified     Problem List Patient Active Problem List   Diagnosis Date Noted  . Back  pain 01/14/2016  . History of lumbar surgery 01/14/2016  . Essential hypertension 01/14/2016  . Hx of renal calculi 01/14/2016  . Chronic pain 01/14/2016  . Primary osteoarthritis, left shoulder 01/14/2016    RJanna Arch SPTA 04/24/2016, 8:20 AM  [facility] [address]Phone: [phone]  Fax:  [phone] Name: GYAKOV BERGENMRN: [id number] of Birth: [birth year]

## 2016-04-28 ENCOUNTER — Ambulatory Visit: Payer: Medicare Other | Admitting: Physical Therapy

## 2016-04-28 ENCOUNTER — Encounter: Payer: Self-pay | Admitting: Physical Therapy

## 2016-04-28 DIAGNOSIS — M25612 Stiffness of left shoulder, not elsewhere classified: Secondary | ICD-10-CM | POA: Diagnosis not present

## 2016-04-28 DIAGNOSIS — M25512 Pain in left shoulder: Secondary | ICD-10-CM | POA: Diagnosis not present

## 2016-04-28 DIAGNOSIS — M6281 Muscle weakness (generalized): Secondary | ICD-10-CM | POA: Diagnosis not present

## 2016-04-28 NOTE — Therapy (Signed)
[place], [place], [address] Phone: [phone]   Fax:  [phone]  Physical Therapy Treatment  Patient Details  Name: Samuel Gibson MRN: [id number] Date of Birth: [birth year] Referring Provider: Edmonia Lynch, MD  Encounter Date: 04/28/2016      PT End of Session - 04/28/16 0815    Visit Number 13   Number of Visits 21   Date for PT Re-Evaluation 05/08/16   Authorization Type MCR   PT Start Time 0718   PT Stop Time 0759   PT Time Calculation (min) 41 min   Activity Tolerance Patient tolerated treatment well   Behavior During Therapy [facility] for tasks assessed/performed      Past Medical History:  Diagnosis Date  . Anxiety   . Arthritis   . Degenerative arthritis of lumbar spine   . Hernia of pelvic floor   . History of kidney stones   . Hypertension     Past Surgical History:  Procedure Laterality Date  . BACK SURGERY    . SHOULDER SURGERY    . 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 TOTAL SHOULDER ARTHROPLASTY Left 01/29/2016   Procedure: TOTAL SHOULDER ARTHROPLASTY;  Surgeon: Renette Butters, MD;  Location: [facility];  Service: Orthopedics;  Laterality: Left;    There were no vitals filed for this visit.      Subjective Assessment - 04/28/16 0720    Subjective Pt reports L shoulder is pain free. He has been building a barn because his wasn't big enough for all of his animals.   Currently in Pain? No/denies                         [facility] Adult PT Treatment/Exercise - 04/28/16 0001      Shoulder Exercises: Prone   Retraction Left   Retraction Weight (lbs) 3lbs   Retraction Limitations 30 reps; paired with ext lift  3#    Horizontal ABduction 1 Left   Horizontal ABduction 1 Weight (lbs) 3lbs   Horizontal ABduction 1 Limitations 30 reps   Other Prone Exercises prone row x30 3#     Shoulder Exercises: Sidelying   External Rotation 20 reps   External Rotation  Limitations 3# towel under elbow, PT assist for positioning   Internal Rotation 20 reps;Left   Internal Rotation Weight (lbs) 3 lbs     Shoulder Exercises: Standing   External Rotation Left;20 reps   Theraband Level (Shoulder External Rotation) Level 2 (Red)   Internal Rotation 20 reps   Theraband Level (Shoulder Internal Rotation) Level 2 (Red)   Flexion Limitations flexion wall slide to lift off; 20 reps   Row 20 reps   Theraband Level (Shoulder Row) Level 2 (Red)   Other Standing Exercises cabinet reaching 2# 3# top shelf x 10 each ; 10 with thumb up each     Shoulder Exercises: ROM/Strengthening   UBE (Upper Arm Bike) L1.3 x 4 minutes forward, 4 minutes backward                  PT Short Term Goals - 04/17/16 0851      PT SHORT TERM GOAL #1   Title Pt will demo AROM flexion to 90 deg without L GHJ elevation indicating improved scapular control by 3/16   Baseline able to demo   Status Achieved     PT SHORT TERM GOAL #2   Title Pt will verbalize improved postural awareness,  being cognisant of scapular retraction during functional activities   Baseline good control   Status Achieved           PT Long Term Goals - 04/07/16 0817      PT LONG TERM GOAL #1   Title Pt will be able to reach for and lift objects to high cabinets in the house without pain or limitation by GHJ by 5/18   Time 12   Period Weeks   Status Achieved     PT LONG TERM GOAL #2   Title Pt will demo 5/5 MMT in all shoulder movements to indicate proper support to shoulder joint   Time 12   Period Weeks   Status Unable to assess     PT LONG TERM GOAL #3   Title Pt will demo good scapular control in reaching and lifting using L arm to improve functional ability   Baseline improving  mechanics    Time 12   Period Weeks   Status Partially Met     PT LONG TERM GOAL #4   Title Pt will be able to return to fishing and hunting activities and verbalize knowledge of postural considerations to  return to PLOF.    Time 12   Period Weeks   Status Unable to assess               Plan - 04/28/16 0816    Clinical Impression Statement Continued focus on shoulder/scapular strength and ROM. Pt is still not HEP compliant. Pt was told he shoulder not be lifting 4x4s; reminded of his precautions. He was able to complete more reps of exercises with no increase in pain. He did say his shoulder felt tighter with flexion and cabinet reaching. Massaging it helped.   Rehab Potential Good   PT Frequency 2x / week   PT Duration 12 weeks   PT Treatment/Interventions ADLs/Self Care Home Management;Cryotherapy;Electrical Stimulation;Iontophoresis 80m/ml Dexamethasone;Moist Heat;Ultrasound;Therapeutic activities;Therapeutic exercise;Neuromuscular re-education;Patient/family education;Passive range of motion;Scar mobilization;Manual techniques;Dry needling;Taping;Vasopneumatic Device   PT Next Visit Plan PROM, AROM with cues for scapular control- see protocol; focus on strengthening shoulder   PT Home Exercise Plan scapular retraction/postural awareness, scaption AROM in Mirror, supine AAROM flexion, isometrics, AAROM ER, prone retraction +ext, Row, IR/ER with yellow; protraction at counter   Consulted and Agree with Plan of Care Patient      Patient will benefit from skilled therapeutic intervention in order to improve the following deficits and impairments:  Impaired UE functional use, Increased muscle spasms, Decreased range of motion, Pain, Decreased activity tolerance, Impaired flexibility, Improper body mechanics, Postural dysfunction, Decreased strength, Decreased mobility  Visit Diagnosis: Muscle weakness (generalized)  Acute pain of left shoulder  Stiffness of left shoulder, not elsewhere classified     Problem List Patient Active Problem List   Diagnosis Date Noted  . Back pain 01/14/2016  . History of lumbar surgery 01/14/2016  . Essential hypertension 01/14/2016  . Hx of  renal calculi 01/14/2016  . Chronic pain 01/14/2016  . Primary osteoarthritis, left shoulder 01/14/2016    RJanna Arch SPTA 04/28/2016, 8:47 AM  [facility] [address]Phone: [phone]  Fax:  [phone] Name: Samuel CRISPMRN: [id number] of Birth: [birth year]

## 2016-05-01 ENCOUNTER — Ambulatory Visit: Payer: Medicare Other | Admitting: Physical Therapy

## 2016-05-01 ENCOUNTER — Encounter: Payer: Self-pay | Admitting: Physical Therapy

## 2016-05-01 DIAGNOSIS — M25612 Stiffness of left shoulder, not elsewhere classified: Secondary | ICD-10-CM | POA: Diagnosis not present

## 2016-05-01 DIAGNOSIS — M6281 Muscle weakness (generalized): Secondary | ICD-10-CM

## 2016-05-01 DIAGNOSIS — M25512 Pain in left shoulder: Secondary | ICD-10-CM

## 2016-05-01 NOTE — Therapy (Addendum)
[place], [place], [address] Phone: [phone]   Fax:  [phone]  Physical Therapy Treatment/Discharge Summary  Patient Details  Name: MCKINNLEY SMITHEY MRN: [id number] Date of Birth: 1955/09/29 Referring Provider: Edmonia Lynch, MD  Encounter Date: 05/01/2016      PT End of Session - 05/01/16 0946    Visit Number 14   Number of Visits 21   Date for PT Re-Evaluation 05/08/16   Authorization Type MCR   PT Start Time 0717   PT Stop Time 0758   PT Time Calculation (min) 41 min   Activity Tolerance Patient tolerated treatment well   Behavior During Therapy [facility] for tasks assessed/performed      Past Medical History:  Diagnosis Date  . Anxiety   . Arthritis   . Degenerative arthritis of lumbar spine   . Hernia of pelvic floor   . History of kidney stones   . Hypertension     Past Surgical History:  Procedure Laterality Date  . BACK SURGERY    . SHOULDER SURGERY    . 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 TOTAL SHOULDER ARTHROPLASTY Left 01/29/2016   Procedure: TOTAL SHOULDER ARTHROPLASTY;  Surgeon: Renette Butters, MD;  Location: [facility];  Service: Orthopedics;  Laterality: Left;    There were no vitals filed for this visit.      Subjective Assessment - 05/01/16 0719    Subjective Pt reports L shoulder is a little bit tense because slept the wrong way; but no pain. Patient feels that he might have a cracked rib but, he is ignoring the pain.   Currently in Pain? No/denies   Aggravating Factors  way he sleeps   Pain Relieving Factors rest, pain meds                         [facility] Adult PT Treatment/Exercise - 05/01/16 0001      Shoulder Exercises: Supine   Other Supine Exercises supine scapular stabilization series x 20 each green     Shoulder Exercises: Sidelying   External Rotation 20 reps   External Rotation Limitations 3# towel under elbow, PT assist for  positioning     Shoulder Exercises: Standing   External Rotation Left;20 reps   Theraband Level (Shoulder External Rotation) Level 2 (Red)   Internal Rotation 20 reps   Theraband Level (Shoulder Internal Rotation) Level 2 (Red)   Flexion Limitations flexion wall slide to lift off; 20 reps   Extension 20 reps;Both   Theraband Level (Shoulder Extension) Level 3 (Green)   Row 20 reps   Theraband Level (Shoulder Row) Level 3 (Green)   Other Standing Exercises abduction stretch on wall 2 x 30 seconds     Shoulder Exercises: ROM/Strengthening   UBE (Upper Arm Bike) L 1.5 x 4 minutes forward, 4 minutes backwards     Shoulder Exercises: Stretch   Wall Stretch - Flexion 2 reps;30 seconds                  PT Short Term Goals - 04/17/16 0851      PT SHORT TERM GOAL #1   Title Pt will demo AROM flexion to 90 deg without L GHJ elevation indicating improved scapular control by 3/16   Baseline able to demo   Status Achieved     PT SHORT TERM GOAL #2   Title Pt will verbalize improved postural awareness, being cognisant of scapular retraction  during functional activities   Baseline good control   Status Achieved           PT Long Term Goals - 04/07/16 0817      PT LONG TERM GOAL #1   Title Pt will be able to reach for and lift objects to high cabinets in the house without pain or limitation by GHJ by 5/18   Time 12   Period Weeks   Status Achieved     PT LONG TERM GOAL #2   Title Pt will demo 5/5 MMT in all shoulder movements to indicate proper support to shoulder joint   Time 12   Period Weeks   Status Unable to assess     PT LONG TERM GOAL #3   Title Pt will demo good scapular control in reaching and lifting using L arm to improve functional ability   Baseline improving  mechanics    Time 12   Period Weeks   Status Partially Met     PT LONG TERM GOAL #4   Title Pt will be able to return to fishing and hunting activities and verbalize knowledge of postural  considerations to return to PLOF.    Time 12   Period Weeks   Status Unable to assess               Plan - 05/01/16 0800    Clinical Impression Statement Patient was unable to perform prone exercises and most side-lying exercise due to pain in his rib. Modified to standing. Added supine scapular stabilization exercises with green theraband to help decrease winging and increase strength. Assess response to series next visit. Patient required cues to slow down reps of all exercises both standing and supine especially the ones he has been performing for several visits. Patient does not do exercises or stretching at home as he is building a barn and gets his exercise that way.    Rehab Potential Good   PT Frequency 2x / week   PT Duration 12 weeks   PT Treatment/Interventions ADLs/Self Care Home Management;Cryotherapy;Electrical Stimulation;Iontophoresis 21m/ml Dexamethasone;Moist Heat;Ultrasound;Therapeutic activities;Therapeutic exercise;Neuromuscular re-education;Patient/family education;Passive range of motion;Scar mobilization;Manual techniques;Dry needling;Taping;Vasopneumatic Device   PT Next Visit Plan PROM, AROM with cues for scapular control- see protocol; focus on strengthening shoulder; assess response to scapular stabilization   PT Home Exercise Plan scapular retraction/postural awareness, scaption AROM in Mirror, supine AAROM flexion, isometrics, AAROM ER, prone retraction +ext, Row, IR/ER with yellow; protraction at counter   Consulted and Agree with Plan of Care Patient      Patient will benefit from skilled therapeutic intervention in order to improve the following deficits and impairments:  Impaired UE functional use, Increased muscle spasms, Decreased range of motion, Pain, Decreased activity tolerance, Impaired flexibility, Improper body mechanics, Postural dysfunction, Decreased strength, Decreased mobility  Visit Diagnosis: Muscle weakness (generalized)  Acute pain of  left shoulder  Stiffness of left shoulder, not elsewhere classified     Problem List Patient Active Problem List   Diagnosis Date Noted  . Back pain 01/14/2016  . History of lumbar surgery 01/14/2016  . Essential hypertension 01/14/2016  . Hx of renal calculi 01/14/2016  . Chronic pain 01/14/2016  . Primary osteoarthritis, left shoulder 01/14/2016    RJanna Arch SPTA 05/01/2016, 9:54 AM  [facility] [address]Phone: [phone]  Fax:  [phone] Name: GRAJEEV ESCUEMRN: [id number] of Birth: 4Sep 03, 1957  PHYSICAL THERAPY DISCHARGE SUMMARY  Visits from Start of Care: 14  Current functional level related to goals / functional outcomes: See above   Remaining deficits: See above   Education / Equipment: Anatomy of condition, POC, HEP, exercise form/rationale  Plan: Patient agrees to discharge.  Patient goals were partially met. Patient is being discharged due to a change in medical status.  ?????    Pt requested D/C from PT after visit to ED due to reported blood vessel bursting in his chest wall. Jessica C. Hightower PT, DPT 06/18/16 1:10 PM

## 2016-05-05 ENCOUNTER — Encounter (HOSPITAL_COMMUNITY): Payer: Self-pay | Admitting: Emergency Medicine

## 2016-05-05 ENCOUNTER — Emergency Department (HOSPITAL_COMMUNITY): Payer: Medicare Other

## 2016-05-05 ENCOUNTER — Ambulatory Visit: Payer: Medicare Other | Admitting: Physical Therapy

## 2016-05-05 ENCOUNTER — Other Ambulatory Visit: Payer: Self-pay

## 2016-05-05 DIAGNOSIS — Z7982 Long term (current) use of aspirin: Secondary | ICD-10-CM | POA: Diagnosis not present

## 2016-05-05 DIAGNOSIS — Z96612 Presence of left artificial shoulder joint: Secondary | ICD-10-CM | POA: Insufficient documentation

## 2016-05-05 DIAGNOSIS — Z79899 Other long term (current) drug therapy: Secondary | ICD-10-CM | POA: Insufficient documentation

## 2016-05-05 DIAGNOSIS — I10 Essential (primary) hypertension: Secondary | ICD-10-CM | POA: Diagnosis not present

## 2016-05-05 DIAGNOSIS — R0789 Other chest pain: Secondary | ICD-10-CM | POA: Insufficient documentation

## 2016-05-05 DIAGNOSIS — F17210 Nicotine dependence, cigarettes, uncomplicated: Secondary | ICD-10-CM | POA: Insufficient documentation

## 2016-05-05 DIAGNOSIS — R079 Chest pain, unspecified: Secondary | ICD-10-CM | POA: Diagnosis not present

## 2016-05-05 LAB — BASIC METABOLIC PANEL
Anion gap: 10 (ref 5–15)
BUN: 9 mg/dL (ref 6–20)
CALCIUM: 9.3 mg/dL (ref 8.9–10.3)
CO2: 22 mmol/L (ref 22–32)
CREATININE: 1.01 mg/dL (ref 0.61–1.24)
Chloride: 105 mmol/L (ref 101–111)
GFR calc non Af Amer: >60 mL/min (ref >60)
GLUCOSE: 111 mg/dL — AB (ref 65–99)
Potassium: 3.8 mmol/L (ref 3.5–5.1)
Sodium: 137 mmol/L (ref 135–145)

## 2016-05-05 LAB — CBC
HCT: 41.3 % (ref 39.0–52.0)
Hemoglobin: 14.2 g/dL (ref 13.0–17.0)
MCH: 30.7 pg (ref 26.0–34.0)
MCHC: 34.4 g/dL (ref 30.0–36.0)
MCV: 89.4 fL (ref 78.0–100.0)
PLATELETS: 274 10*3/uL (ref 150–400)
RBC: 4.62 MIL/uL (ref 4.22–5.81)
RDW: 12.9 % (ref 11.5–15.5)
WBC: 11.6 10*3/uL — ABNORMAL HIGH (ref 4.0–10.5)

## 2016-05-05 LAB — I-STAT TROPONIN, ED: TROPONIN I, POC: 0.01 ng/mL (ref 0.00–0.08)

## 2016-05-05 NOTE — ED Triage Notes (Signed)
Pt reports that he was watching TV around 8pm and began to have left sided chest paint that moved into his neck. Reports this pain for three days but sudden and worsening tonight.  Tender when palp., denies diaphoresis, nausea, sob, dizziness.  Took 324 ASA per MD, given 1 nitro by EMS

## 2016-05-05 NOTE — ED Notes (Signed)
Patient transported to X-ray 

## 2016-05-06 ENCOUNTER — Emergency Department (HOSPITAL_COMMUNITY)
Admission: EM | Admit: 2016-05-06 | Discharge: 2016-05-06 | Disposition: A | Discharge status: Home / Self Care | Payer: Medicare Other | Attending: Emergency Medicine | Admitting: Emergency Medicine

## 2016-05-06 ENCOUNTER — Emergency Department (HOSPITAL_COMMUNITY): Payer: Medicare Other

## 2016-05-06 DIAGNOSIS — R0789 Other chest pain: Secondary | ICD-10-CM | POA: Diagnosis not present

## 2016-05-06 DIAGNOSIS — R079 Chest pain, unspecified: Secondary | ICD-10-CM | POA: Diagnosis not present

## 2016-05-06 LAB — TROPONIN I

## 2016-05-06 LAB — D-DIMER, QUANTITATIVE (NOT AT ARMC): D DIMER QUANT: 0.67 ug{FEU}/mL — AB (ref 0.00–0.50)

## 2016-05-06 MED ORDER — IOPAMIDOL (ISOVUE-370) INJECTION 76%
INTRAVENOUS | Status: AC
  Administered 2016-05-06: 85 mL via INTRAVENOUS
  Filled 2016-05-06: qty 100

## 2016-05-06 MED ORDER — OXYCODONE HCL 5 MG PO TABS
20.0000 mg | ORAL_TABLET | Freq: Once | ORAL | Status: AC
  Administered 2016-05-06: 20 mg via ORAL
  Filled 2016-05-06: qty 4

## 2016-05-06 MED ORDER — METOPROLOL SUCCINATE ER 50 MG PO TB24: 50.0000 mg | ORAL_TABLET | Freq: Every day | ORAL | Status: DC

## 2016-05-06 MED ORDER — IBUPROFEN 800 MG PO TABS: 800.0000 mg | ORAL_TABLET | Freq: Three times a day (TID) | ORAL | 0 refills | Status: DC | PRN

## 2016-05-06 NOTE — Discharge Instructions (Signed)
There is no evidence of heart attack or blood clot in the lung. Followup with your doctor for a stress test. Return to the ED if you develop new or worsening symptoms. °

## 2016-05-06 NOTE — ED Provider Notes (Signed)
[facility]-EMERGENCY DEPT Provider Note   CSN: [id number] Arrival date & time: 05/05/16  2254  By signing my name below, I, Elder Negus, attest that this documentation has been prepared under the direction and in the presence of Glynn Octave, MD. Electronically Signed: Elder Negus, Scribe. 05/06/16. 4:00 AM.   History   Chief Complaint Chief Complaint  Patient presents with  . Chest Pain    HPI Samuel Gibson is a 61 y.o. male with history of hypertension and arthritis who presents to the ED for evaluation of chest pain. This patient states that in the last 3 days he has experienced intermittent central chest pain that lasts "less than an hour" at a time without any associated symptoms. Approximately 6 hours ago was at home in bed when he had similar chest pain that has been constant. No dyspnea, nausea, or diaphoresis. No cardiac history in himself or family. No prior stress tests. Worse when moving his L arm. Not worse when breathing deeply. He is also reporting a headache at interview. No particular injuries.  The history is provided by the patient. No language interpreter was used.  Chest Pain   This is a new problem. The current episode started more than 2 days ago. The problem occurs constantly. The problem has not changed since onset.The pain is associated with movement. Associated symptoms include headaches. Pertinent negatives include no nausea and no shortness of breath.    Past Medical History:  Diagnosis Date  . Anxiety   . Arthritis   . Degenerative arthritis of lumbar spine   . Hernia of pelvic floor   . History of kidney stones   . Hypertension     Patient Active Problem List   Diagnosis Date Noted  . Back pain 01/14/2016  . History of lumbar surgery 01/14/2016  . Essential hypertension 01/14/2016  . Hx of renal calculi 01/14/2016  . Chronic pain 01/14/2016  . Primary osteoarthritis, left shoulder 01/14/2016    Past Surgical History:  Procedure  Laterality Date  . BACK SURGERY    . SHOULDER SURGERY    . 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 TOTAL SHOULDER ARTHROPLASTY Left 01/29/2016   Procedure: TOTAL SHOULDER ARTHROPLASTY;  Surgeon: Sheral Apley, MD;  Location: [facility] OR;  Service: Orthopedics;  Laterality: Left;       Home Medications    Prior to Admission medications   Medication Sig Start Date End Date Taking? Authorizing Provider  aspirin EC 81 MG tablet Take 1 tablet (81 mg total) by mouth daily. For 30 days for dvt prophylaxis 01/29/16   Lucretia Kern Martensen III, PA-C  atorvastatin (LIPITOR) 40 MG tablet Take 40 mg by mouth daily at 6 PM.    Historical Provider, MD  docusate sodium (COLACE) 100 MG capsule Take 1 capsule (100 mg total) by mouth 2 (two) times daily. To prevent constipation while taking pain medication. 01/29/16   Lucretia Kern Martensen III, PA-C  ibuprofen (ADVIL,MOTRIN) 800 MG tablet Take 1 tablet (800 mg total) by mouth 3 (three) times daily. 10/15/14   Fayrene Helper, PA-C  methocarbamol (ROBAXIN) 500 MG tablet Take 1 tablet (500 mg total) by mouth every 6 (six) hours as needed for muscle spasms. Patient not taking: Reported on 02/27/2016 01/29/16   Albina Billet III, PA-C  metoprolol succinate (TOPROL-XL) 50 MG 24 hr tablet Take 50 mg by mouth at bedtime. Take with or immediately following a meal.     Historical Provider, MD  omeprazole (PRILOSEC) 20 MG capsule  Take 1 capsule (20 mg total) by mouth daily. While taking anti inflammatory medicine daily Patient not taking: Reported on 02/27/2016 01/29/16   Albina Billet III, PA-C  ondansetron (ZOFRAN) 4 MG tablet Take 1 tablet (4 mg total) by mouth every 8 (eight) hours as needed for nausea or vomiting. Patient not taking: Reported on 02/27/2016 01/29/16   Albina Billet III, PA-C  Oxycodone HCl 20 MG TABS Take 20 mg by mouth every 6 (six) hours as needed.    Historical Provider, MD  oxyCODONE-acetaminophen (ROXICET) 5-325 MG tablet  Take 1-2 tablets by mouth every 4 (four) hours as needed for severe pain. For breakthrough pain only.  Utilize previously prescribed chronic medicine first. 01/29/16   Albina Billet III, PA-C  traZODone (DESYREL) 50 MG tablet Take 50 mg by mouth at bedtime.    Historical Provider, MD    Family History No family history on file.  Social History Social History  Substance Use Topics  . Smoking status: Current Every Day Smoker    Packs/day: 0.50    Years: 34.00    Types: Cigarettes  . Smokeless tobacco: Never Used  . Alcohol use No     Allergies   No known allergies   Review of Systems Review of Systems  Respiratory: Negative for shortness of breath.   Cardiovascular: Positive for chest pain.  Gastrointestinal: Negative for nausea.  Neurological: Positive for headaches.  All other systems reviewed and are negative.    Physical Exam Updated Vital Signs BP 131/85   Pulse 73   Temp 98 F (36.7 C) (Oral)   Resp 18   Ht  (1.778 m)   Wt 205 lb (93 kg)   SpO2 95%   BMI 29.41 kg/m   Physical Exam  Constitutional: He is oriented to person, place, and time. He appears well-developed and well-nourished. No distress.  Uncomfortable appearing.  HENT:  Head: Normocephalic and atraumatic.  Mouth/Throat: Oropharynx is clear and moist. No oropharyngeal exudate.  Eyes: Conjunctivae and EOM are normal. Pupils are equal, round, and reactive to light.  Neck: Normal range of motion. Neck supple.  No meningismus.  Cardiovascular: Normal rate, regular rhythm, normal heart sounds and intact distal pulses.   No murmur heard. Pulmonary/Chest: Effort normal and breath sounds normal. No respiratory distress.  Reproducible L chest wall tenderness, worse with L arm movement.  Abdominal: Soft. There is no tenderness. There is no rebound and no guarding.  Musculoskeletal: Normal range of motion. He exhibits no edema or tenderness.  Neurological: He is alert and oriented to  person, place, and time. No cranial nerve deficit. He exhibits normal muscle tone. Coordination normal.   5/5 strength throughout. CN 2-12 intact.Equal grip strength.   Skin: Skin is warm.  Psychiatric: He has a normal mood and affect. His behavior is normal.  Nursing note and vitals reviewed.    ED Treatments / Results  Labs (all labs ordered are listed, but only abnormal results are displayed) Labs Reviewed  BASIC METABOLIC PANEL - Abnormal; Notable for the following:       Result Value   Glucose, Bld 111 (*)    All other components within normal limits  CBC - Abnormal; Notable for the following:    WBC 11.6 (*)    All other components within normal limits  I-STAT TROPOININ, ED    EKG  EKG Interpretation  Date/Time:  Monday May 05 2016 22:59:15 EDT Ventricular Rate:  81 PR Interval:  130 QRS Duration:  86 QT Interval:  366 QTC Calculation: 425 R Axis:   79 Text Interpretation:  Normal sinus rhythm Normal ECG No significant change was found Confirmed by Manus Gunning  MD, Elvin Mccartin [phone]) on 05/06/2016 3:53:26 AM       Radiology Dg Chest 2 View  Result Date: 05/05/2016 CLINICAL DATA:  Sudden onset left-sided chest pain at rest. EXAM: CHEST  2 VIEW COMPARISON:  10/15/2014 FINDINGS: The lungs are clear. The pulmonary vasculature is normal. Heart size is normal. Hilar and mediastinal contours are unremarkable. There is no pleural effusion. IMPRESSION: No active cardiopulmonary disease. Electronically Signed   By: Ellery Plunk M.D.   On: 05/05/2016 23:41    Procedures Procedures (including critical care time)  Medications Ordered in ED Medications - No data to display   Initial Impression / Assessment and Plan / ED Course  I have reviewed the triage vital signs and the nursing notes.  Pertinent labs & imaging results that were available during my care of the patient were reviewed by me and considered in my medical decision making (see chart for details).      Constant L sided chest pain since 8pm. Worse with palpation and movement.   EKG without acute ischemia. Initial troponin and CXR negative.  Low suspicion for ACS.  Pain is reproducible and troponin is negative after several hours of ongoing pain. D-dimer mildly elevated.  No evidence of PE. Troponin negative x2. Pain improved in the ED.  Treast as MSK chest pain. No evidence of ACS or PE.  Followup with PCP for stress test. Return precautions discussed.  Final Clinical Impressions(s) / ED Diagnoses   Final diagnoses:  Chest wall pain    New Prescriptions New Prescriptions   No medications on file   I personally performed the services described in this documentation, which was scribed in my presence. The recorded information has been reviewed and is accurate.   Glynn Octave, MD 05/06/16 1911

## 2016-05-08 ENCOUNTER — Ambulatory Visit: Payer: Medicare Other | Attending: Orthopedic Surgery | Admitting: Physical Therapy

## 2016-05-08 DIAGNOSIS — M940 Chondrocostal junction syndrome [Tietze]: Secondary | ICD-10-CM | POA: Diagnosis not present

## 2016-05-08 DIAGNOSIS — I10 Essential (primary) hypertension: Secondary | ICD-10-CM | POA: Diagnosis not present

## 2016-05-08 DIAGNOSIS — M545 Low back pain: Secondary | ICD-10-CM | POA: Diagnosis not present

## 2016-05-08 DIAGNOSIS — G894 Chronic pain syndrome: Secondary | ICD-10-CM | POA: Diagnosis not present

## 2016-05-08 DIAGNOSIS — E782 Mixed hyperlipidemia: Secondary | ICD-10-CM | POA: Diagnosis not present

## 2016-05-08 DIAGNOSIS — F17200 Nicotine dependence, unspecified, uncomplicated: Secondary | ICD-10-CM | POA: Diagnosis not present

## 2016-05-08 DIAGNOSIS — R7303 Prediabetes: Secondary | ICD-10-CM | POA: Diagnosis not present

## 2016-05-08 DIAGNOSIS — K219 Gastro-esophageal reflux disease without esophagitis: Secondary | ICD-10-CM | POA: Diagnosis not present

## 2016-05-08 DIAGNOSIS — G4700 Insomnia, unspecified: Secondary | ICD-10-CM | POA: Diagnosis not present

## 2016-05-12 ENCOUNTER — Ambulatory Visit: Payer: Medicare Other | Admitting: Physical Therapy

## 2016-05-12 ENCOUNTER — Telehealth: Payer: Self-pay | Admitting: Physical Therapy

## 2016-05-12 NOTE — Telephone Encounter (Signed)
Spoke to patient regarding missed appointments. He was having chest pain/SOB so he went to the ED. He said a blood vessel in his chest wall burst and he is still very sore. He agreed to discharge from PT at this time and will return with new referral from MD if needed.

## 2016-05-15 ENCOUNTER — Ambulatory Visit: Payer: Medicare Other | Admitting: Physical Therapy

## 2016-05-29 DIAGNOSIS — R7303 Prediabetes: Secondary | ICD-10-CM | POA: Diagnosis not present

## 2016-06-02 ENCOUNTER — Emergency Department (HOSPITAL_COMMUNITY)
Admission: EM | Admit: 2016-06-02 | Discharge: 2016-06-03 | Disposition: A | Discharge status: Home / Self Care | Payer: Medicare Other | Attending: Emergency Medicine | Admitting: Emergency Medicine

## 2016-06-02 ENCOUNTER — Encounter (HOSPITAL_COMMUNITY): Payer: Self-pay | Admitting: Family Medicine

## 2016-06-02 DIAGNOSIS — Y939 Activity, unspecified: Secondary | ICD-10-CM | POA: Insufficient documentation

## 2016-06-02 DIAGNOSIS — F17210 Nicotine dependence, cigarettes, uncomplicated: Secondary | ICD-10-CM | POA: Insufficient documentation

## 2016-06-02 DIAGNOSIS — Z7982 Long term (current) use of aspirin: Secondary | ICD-10-CM | POA: Insufficient documentation

## 2016-06-02 DIAGNOSIS — S3992XA Unspecified injury of lower back, initial encounter: Secondary | ICD-10-CM | POA: Diagnosis present

## 2016-06-02 DIAGNOSIS — M545 Low back pain: Secondary | ICD-10-CM | POA: Insufficient documentation

## 2016-06-02 DIAGNOSIS — I10 Essential (primary) hypertension: Secondary | ICD-10-CM | POA: Insufficient documentation

## 2016-06-02 DIAGNOSIS — Y999 Unspecified external cause status: Secondary | ICD-10-CM | POA: Diagnosis not present

## 2016-06-02 DIAGNOSIS — Z96612 Presence of left artificial shoulder joint: Secondary | ICD-10-CM | POA: Diagnosis not present

## 2016-06-02 DIAGNOSIS — Y929 Unspecified place or not applicable: Secondary | ICD-10-CM | POA: Diagnosis not present

## 2016-06-02 DIAGNOSIS — X509XXA Other and unspecified overexertion or strenuous movements or postures, initial encounter: Secondary | ICD-10-CM | POA: Diagnosis not present

## 2016-06-02 DIAGNOSIS — M549 Dorsalgia, unspecified: Secondary | ICD-10-CM

## 2016-06-02 NOTE — ED Triage Notes (Signed)
Patient is complaining of lower back from when he was throwing tennis balls yesterday to his dog. Pt reports he heard a like "twig snap" to his lower back. Pt reports he has had back surgery in 2012. Denies any urinary incontinence or numbness or tingling. Pt has used heating pad and took prescribed medication with no relief.

## 2016-06-03 ENCOUNTER — Emergency Department (HOSPITAL_COMMUNITY): Payer: Medicare Other

## 2016-06-03 DIAGNOSIS — M545 Low back pain: Secondary | ICD-10-CM | POA: Diagnosis not present

## 2016-06-03 MED ORDER — PREDNISONE 10 MG PO TABS: 40.0000 mg | ORAL_TABLET | Freq: Every day | ORAL | 0 refills | Status: AC

## 2016-06-03 MED ORDER — OXYCODONE HCL 5 MG PO TABS
10.0000 mg | ORAL_TABLET | Freq: Once | ORAL | Status: AC
  Administered 2016-06-03: 10 mg via ORAL
  Filled 2016-06-03: qty 2

## 2016-06-03 MED ORDER — PREDNISONE 20 MG PO TABS
60.0000 mg | ORAL_TABLET | Freq: Once | ORAL | Status: AC
  Administered 2016-06-03: 60 mg via ORAL
  Filled 2016-06-03: qty 3

## 2016-06-03 MED ORDER — CARISOPRODOL 350 MG PO TABS: 350.0000 mg | ORAL_TABLET | Freq: Three times a day (TID) | ORAL | 0 refills | Status: AC

## 2016-06-03 MED ORDER — IBUPROFEN 200 MG PO TABS
600.0000 mg | ORAL_TABLET | Freq: Once | ORAL | Status: AC
  Administered 2016-06-03: 600 mg via ORAL
  Filled 2016-06-03: qty 3

## 2016-06-03 MED ORDER — LIDOCAINE 5 % EX PTCH
1.0000 | MEDICATED_PATCH | CUTANEOUS | Status: DC
  Administered 2016-06-03: 1 via TRANSDERMAL
  Filled 2016-06-03: qty 1

## 2016-06-03 MED ORDER — ACETAMINOPHEN 500 MG PO TABS
1000.0000 mg | ORAL_TABLET | Freq: Once | ORAL | Status: AC
  Administered 2016-06-03: 1000 mg via ORAL
  Filled 2016-06-03: qty 2

## 2016-06-03 NOTE — Discharge Instructions (Signed)
Here are your CT scan results:  IMPRESSION: No acute fracture.   Status post L4-5 and L5-S1 PLIF with arthrodesis. 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   Moderate canal stenosis L3-4 with moderate L3-4 neural foraminal narrowing.  We will treat your symptoms today with a combination of steroids, muscle relaxers, anti-inflammatories. Please continue taking your prescribed oxycodone.  I recommend that you contact your spine surgeon as soon as possible for reevaluation and for persistent or worsening symptoms.  Return to the emergency department immediately. Notice sudden onset of worsening pain in your back associated with bladder or bowel incontinence or retention, groin numbness, numbness or weakness to her lower extremities or abdominal pain.

## 2016-06-03 NOTE — ED Notes (Signed)
Bed: WA04 Expected date:  Expected time:  Means of arrival:  Comments: 

## 2016-06-03 NOTE — ED Provider Notes (Signed)
[facility]-EMERGENCY DEPT Provider Note   CSN: [id number] Arrival date & time: 06/02/16  2327  By signing my name below, I, Samuel Gibson, attest that this documentation has been prepared under the direction and in the presence of non-physician practitioner, Sharen Heck, PA-C. Electronically Signed: Modena Gibson, Scribe. 06/03/2016. 1:46 AM.  History   Chief Complaint Chief Complaint  Patient presents with  . Back Pain   The history is provided by the patient. No language interpreter was used.   HPI Comments: Samuel Gibson is a 61 y.o. male who presents to the Emergency Department complaining of constant moderate lower back pain that started last night. He states he felt a sudden onset of pain he was sitting in his porch throwing a ball. His pain is unrelieved by oxycodone (last dose: ~4 hours ago) and muscle relaxants and exacerbated by any movement. He reports his pain is non-radiating and is in the same location he had surgery in 2012. He has a prior hx of degenerative arthritis of the lumbar spine. Denies any dysuria, nausea, vomiting, chest pain, SOB, numbness, saddle anesthesia, or other complaints at this time.  PCP: Samuel Blamer, MD  Past Medical History:  Diagnosis Date  . Anxiety   . Arthritis   . Degenerative arthritis of lumbar spine   . Hernia of pelvic floor   . History of kidney stones   . Hypertension     Patient Active Problem List   Diagnosis Date Noted  . Back pain 01/14/2016  . History of lumbar surgery 01/14/2016  . Essential hypertension 01/14/2016  . Hx of renal calculi 01/14/2016  . Chronic pain 01/14/2016  . Primary osteoarthritis, left shoulder 01/14/2016    Past Surgical History:  Procedure Laterality Date  . BACK SURGERY    . SHOULDER SURGERY    . 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 TOTAL SHOULDER ARTHROPLASTY Left 01/29/2016   Procedure: TOTAL SHOULDER ARTHROPLASTY;  Surgeon: Sheral Apley, MD;  Location: [facility] OR;  Service:  Orthopedics;  Laterality: Left;       Home Medications    Prior to Admission medications   Medication Sig Start Date End Date Taking? Authorizing Provider  aspirin EC 81 MG tablet Take 1 tablet (81 mg total) by mouth daily. For 30 days for dvt prophylaxis 01/29/16   Albina Billet III, PA-C  atorvastatin (LIPITOR) 40 MG tablet Take 40 mg by mouth daily at 6 PM.    [provider]  carisoprodol (SOMA) 350 MG tablet Take 1 tablet (350 mg total) by mouth 3 (three) times daily. 06/03/16 06/08/16  Liberty Handy, PA-C  docusate sodium (COLACE) 100 MG capsule Take 1 capsule (100 mg total) by mouth 2 (two) times daily. To prevent constipation while taking pain medication. 01/29/16   Albina Billet III, PA-C  ibuprofen (ADVIL,MOTRIN) 800 MG tablet Take 1 tablet (800 mg total) by mouth every 8 (eight) hours as needed. 05/06/16   Rancour, Jeannett Senior, MD  metoprolol succinate (TOPROL-XL) 50 MG 24 hr tablet Take 50 mg by mouth at bedtime. Take with or immediately following a meal.     [provider]  omeprazole (PRILOSEC) 20 MG capsule Take 1 capsule (20 mg total) by mouth daily. While taking anti inflammatory medicine daily Patient not taking: Reported on 02/27/2016 01/29/16   Albina Billet III, PA-C  ondansetron (ZOFRAN) 4 MG tablet Take 1 tablet (4 mg total) by mouth every 8 (eight) hours as needed for nausea or vomiting. Patient not taking:  Reported on 02/27/2016 01/29/16   Albina Billet III, PA-C  Oxycodone HCl 20 MG TABS Take 20 mg by mouth every 6 (six) hours as needed.    [provider]  oxyCODONE-acetaminophen (ROXICET) 5-325 MG tablet Take 1-2 tablets by mouth every 4 (four) hours as needed for severe pain. For breakthrough pain only.  Utilize previously prescribed chronic medicine first. 01/29/16   Albina Billet III, PA-C  predniSONE (DELTASONE) 10 MG tablet Take 4 tablets (40 mg total) by mouth daily. 06/03/16 06/08/16  Liberty Handy, PA-C  traZODone (DESYREL) 50 MG tablet Take 50 mg by mouth at bedtime.    [provider]    Family History History reviewed. No pertinent family history.  Social History Social History  Substance Use Topics  . Smoking status: Current Every Day Smoker    Packs/day: 0.50    Years: 34.00    Types: Cigarettes  . Smokeless tobacco: Never Used  . Alcohol use No     Allergies   No known allergies   Review of Systems Review of Systems  Respiratory: Negative for shortness of breath.   Cardiovascular: Negative for chest pain.  Gastrointestinal: Negative for nausea and vomiting.  Genitourinary: Negative for difficulty urinating, dysuria, hematuria and urgency.  Musculoskeletal: Positive for back pain.  Neurological: Negative for numbness.     Physical Exam Updated Vital Signs BP (!) 156/89 (BP Location: Left Arm)   Pulse 87   Temp 97.8 F (36.6 C) (Oral)   Resp 18   Ht 5\' 10"  (1.778 m)   Wt 210 lb (95.3 kg)   SpO2 100%   BMI 30.13 kg/m   Physical Exam  Constitutional: He is oriented to person, place, and time. He appears well-developed and well-nourished. No distress.  HENT:  Head: Normocephalic and atraumatic.  Nose: Nose normal.  Mouth/Throat: Oropharynx is clear and moist. No oropharyngeal exudate.  Eyes: Conjunctivae and EOM are normal. Pupils are equal, round, and reactive to light.  Neck: Normal range of motion. Neck supple. No JVD present. No tracheal deviation present.  Cardiovascular: Normal rate, regular rhythm, normal heart sounds and intact distal pulses.   No murmur heard. Pulmonary/Chest: Effort normal and breath sounds normal. No respiratory distress. He has no wheezes. He has no rales.  Abdominal: Soft. Bowel sounds are normal. He exhibits no distension. There is no tenderness.  Musculoskeletal: Normal range of motion. He exhibits tenderness. He exhibits no deformity.       Lumbar back: He exhibits tenderness.       Back:  +Very  mild lumbar spine tenderness with mild bilateral lumbar paraspinal muscle tenderness.  + Pain with lumbar spine flexion No midline CT spine tenderness.  Full active ROM of CTL spine including flexion, extension, lateral bend and rotation. SI joints and sciatic notch non tender.  Full passive hip, knee and ankle ROM bilaterally.  Negative SLR. Negative Faber. Negative Stinchfield test.  Gait normal.   Lymphadenopathy:    He has no cervical adenopathy.  Neurological: He is alert and oriented to person, place, and time.  Steady gait without assist.  5/5 strength with hip flexion and extension, bilaterally.  5/5 strength with knee flexion and extension, bilaterally.  5/5 strength with ankle dorsiflexion and plantar flexion, bilaterally.  Sensation to light touch intact in the distribution of the obturator nerve, lateral cutaneous nerve, femoral nerve, common fibular nerve.  Feet: sensation to light touch intact in the distribution of the saphenous nerve, medial plantar nerve, lateral  plantar nerve, bilaterally.   Skin: Skin is warm and dry. Capillary refill takes less than 2 seconds.  Psychiatric: He has a normal mood and affect. His behavior is normal. Judgment and thought content normal.  Nursing note and vitals reviewed.    ED Treatments / Results  DIAGNOSTIC STUDIES: Oxygen Saturation is 100% on RA, normal by my interpretation.    COORDINATION OF CARE: 1:50 AM- Pt advised of plan for treatment and pt agrees.  Labs (all labs ordered are listed, but only abnormal results are displayed) Labs Reviewed - No data to display  EKG  EKG Interpretation None       Radiology Ct Lumbar Spine Wo Contrast  Result Date: 06/03/2016 CLINICAL DATA:  Low back pain after throwing a ball Jun 02, 2016. History of lumbar spine surgery 2012. EXAM: CT LUMBAR SPINE WITHOUT CONTRAST TECHNIQUE: Multidetector CT imaging of the lumbar spine was performed without intravenous contrast administration.  Multiplanar CT image reconstructions were also generated. COMPARISON:  CT abdomen and pelvis August 06, 2012 FINDINGS: SEGMENTATION: For the purposes of this report the last well-formed intervertebral disc space is reported as L5-S1. ALIGNMENT: Maintained lumbar lordosis.  Grade 1 L3-4 retrolisthesis. VERTEBRAE: Vertebral bodies are intact. Status post L4-5 and L5-S1 PLIF and posterior decompression with arthrodesis. Intact hardware without periprosthetic lucency. Non surgically altered disc heights preserved. No destructive bony lesions. PARASPINAL AND OTHER SOFT TISSUES: Included prevertebral and paraspinal soft tissues are nonacute. Mild calcific atherosclerosis of the aorta iliac vessels. Bridging RIGHT greater than LEFT sacroiliac osteophytes. DISC LEVELS: T10-11 through L2-3: No disc bulge, canal stenosis nor neural foraminal narrowing. L3-4: Retrolisthesis. Small broad-based disc bulge. Moderate facet arthropathy, widening of the facets to 5 mm on the LEFT. The mild canal stenosis. Moderate bilateral neural foraminal narrowing. L4-5, L5-S1: PLIF, posterior decompression without osseous canal stenosis or neural foraminal narrowing. IMPRESSION: No acute fracture. Status post L4-5 and L5-S1 PLIF with arthrodesis. 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 Moderate canal stenosis L3-4 with moderate L3-4 neural foraminal narrowing. Electronically Signed   By: Awilda Metroourtnay  Bloomer M.D.   On: 06/03/2016 03:32    Procedures Procedures (including critical care time)  Medications Ordered in ED Medications  lidocaine (LIDODERM) 5 % 1 patch (1 patch Transdermal Patch Applied 06/03/16 0254)  predniSONE (DELTASONE) tablet 60 mg (not administered)  oxyCODONE (Oxy IR/ROXICODONE) immediate release tablet 10 mg (10 mg Oral Given 06/03/16 0254)  acetaminophen (TYLENOL) tablet 1,000 mg (1,000 mg Oral Given 06/03/16 0254)    ibuprofen (ADVIL,MOTRIN) tablet 600 mg (600 mg Oral Given 06/03/16 0254)     Initial Impression / Assessment and Plan / ED Course  I have reviewed the triage vital signs and the nursing notes.  Pertinent labs & imaging results that were available during my care of the patient were reviewed by me and considered in my medical decision making (see chart for details).  Clinical Course as of Jun 03 344  Tue Jun 03, 2016  0337 IMPRESSION: No acute fracture.  Status post L4-5 and L5-S1 PLIF with arthrodesis. 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  Moderate canal stenosis L3-4 with moderate L3-4 neural foraminal narrowing.  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  Patient reports feeling a "twig" snap in his back.   Symptoms not relieved by oxycodone or NSAIDs at home. No red flag symptoms of back pain including: fecal incontinence, urinary retention or overflow incontinence, night sweats, waking from sleep with back pain, unexplained fevers or weight loss, h/o cancer, IVDU, recent trauma. Low suspicion for cauda equina, epidural abscess, or other serious cause of back pain. On exam patient has mild midline lumbar spine and bilateral paraspinal muscle tenderness.  No focal neuro deficits. No abdominal pain or urinary symptoms. Patient reports pain is much worse than his baseline back pain in the past. Given h/o of DDD and back surgery will get CT scan.   CT scan without acute bony abnormalities.  Will discharge patient with symptomatic tx and advised to f/u with spine surgeon within 7-10 days for worsening or persistent symptoms. Strict ED return precautions given.  Final Clinical  Impressions(s) / ED Diagnoses   Final diagnoses:  Back pain    New Prescriptions New Prescriptions   PREDNISONE (DELTASONE) 10 MG TABLET    Take 4 tablets (40 mg total) by mouth daily.   I personally performed the services described in this documentation, which was scribed in my presence. The recorded information has been reviewed and is accurate.     Liberty Handy, PA-C 06/03/16 1610    Nicanor Alcon, April, MD 06/03/16 [phone]

## 2016-06-03 NOTE — ED Notes (Signed)
Pt transported to CT ?

## 2016-07-15 DIAGNOSIS — Z6830 Body mass index (BMI) 30.0-30.9, adult: Secondary | ICD-10-CM | POA: Diagnosis not present

## 2016-07-15 DIAGNOSIS — M545 Low back pain: Secondary | ICD-10-CM | POA: Diagnosis not present

## 2016-07-15 DIAGNOSIS — M48061 Spinal stenosis, lumbar region without neurogenic claudication: Secondary | ICD-10-CM | POA: Diagnosis not present

## 2016-07-15 DIAGNOSIS — I10 Essential (primary) hypertension: Secondary | ICD-10-CM | POA: Diagnosis not present

## 2016-07-17 NOTE — Addendum Note (Signed)
Addendum  created 07/17/16 1534 by Mal AmabileFoster, Rockie Vawter, MD   Sign clinical note

## 2016-07-17 NOTE — Anesthesia Postprocedure Evaluation (Signed)
Anesthesia Post Note  Patient: Samuel BucklerGary L Gibson  Procedure(s) Performed: Procedure(s) (LRB): TOTAL SHOULDER ARTHROPLASTY (Left)     Anesthesia Post Evaluation  Last Vitals:  Vitals:   01/29/16 2245 01/30/16 0529  BP: 107/76 112/70  Pulse: 84 77  Resp: 15 16  Temp: 37.3 C 37.1 C    Last Pain:  Vitals:   01/30/16 0534  TempSrc:   PainSc: Asleep                 Yariela Tison A.

## 2016-08-19 DIAGNOSIS — G894 Chronic pain syndrome: Secondary | ICD-10-CM | POA: Diagnosis not present

## 2016-08-19 DIAGNOSIS — E782 Mixed hyperlipidemia: Secondary | ICD-10-CM | POA: Diagnosis not present

## 2016-08-19 DIAGNOSIS — I10 Essential (primary) hypertension: Secondary | ICD-10-CM | POA: Diagnosis not present

## 2016-08-19 DIAGNOSIS — R7303 Prediabetes: Secondary | ICD-10-CM | POA: Diagnosis not present

## 2016-08-19 DIAGNOSIS — F17200 Nicotine dependence, unspecified, uncomplicated: Secondary | ICD-10-CM | POA: Diagnosis not present

## 2016-08-19 DIAGNOSIS — M545 Low back pain: Secondary | ICD-10-CM | POA: Diagnosis not present

## 2016-08-19 DIAGNOSIS — G4700 Insomnia, unspecified: Secondary | ICD-10-CM | POA: Diagnosis not present

## 2016-09-03 ENCOUNTER — Emergency Department (HOSPITAL_COMMUNITY)
Admission: EM | Admit: 2016-09-03 | Discharge: 2016-09-03 | Disposition: A | Discharge status: Home / Self Care | Payer: Medicare Other | Attending: Emergency Medicine | Admitting: Emergency Medicine

## 2016-09-03 ENCOUNTER — Encounter (HOSPITAL_COMMUNITY): Payer: Self-pay | Admitting: Emergency Medicine

## 2016-09-03 ENCOUNTER — Emergency Department (HOSPITAL_COMMUNITY): Payer: Medicare Other

## 2016-09-03 DIAGNOSIS — R0789 Other chest pain: Secondary | ICD-10-CM | POA: Insufficient documentation

## 2016-09-03 DIAGNOSIS — F17200 Nicotine dependence, unspecified, uncomplicated: Secondary | ICD-10-CM | POA: Diagnosis not present

## 2016-09-03 DIAGNOSIS — W868XXA Exposure to other electric current, initial encounter: Secondary | ICD-10-CM | POA: Diagnosis not present

## 2016-09-03 DIAGNOSIS — T754XXA Electrocution, initial encounter: Secondary | ICD-10-CM | POA: Diagnosis not present

## 2016-09-03 DIAGNOSIS — M79641 Pain in right hand: Secondary | ICD-10-CM | POA: Diagnosis not present

## 2016-09-03 DIAGNOSIS — Z7982 Long term (current) use of aspirin: Secondary | ICD-10-CM | POA: Insufficient documentation

## 2016-09-03 DIAGNOSIS — Z79899 Other long term (current) drug therapy: Secondary | ICD-10-CM | POA: Diagnosis not present

## 2016-09-03 DIAGNOSIS — M549 Dorsalgia, unspecified: Secondary | ICD-10-CM | POA: Diagnosis not present

## 2016-09-03 DIAGNOSIS — S299XXA Unspecified injury of thorax, initial encounter: Secondary | ICD-10-CM | POA: Diagnosis not present

## 2016-09-03 DIAGNOSIS — R079 Chest pain, unspecified: Secondary | ICD-10-CM | POA: Diagnosis not present

## 2016-09-03 LAB — I-STAT CHEM 8, ED
BUN: 15 mg/dL (ref 6–20)
CALCIUM ION: 1.21 mmol/L (ref 1.15–1.40)
CREATININE: 1 mg/dL (ref 0.61–1.24)
Chloride: 103 mmol/L (ref 101–111)
GLUCOSE: 140 mg/dL — AB (ref 65–99)
HCT: 46 % (ref 39.0–52.0)
HEMOGLOBIN: 15.6 g/dL (ref 13.0–17.0)
Potassium: 4 mmol/L (ref 3.5–5.1)
Sodium: 140 mmol/L (ref 135–145)
TCO2: 24 mmol/L (ref 22–32)

## 2016-09-03 LAB — CBC WITH DIFFERENTIAL/PLATELET
BASOS ABS: 0 10*3/uL (ref 0.0–0.1)
Basophils Relative: 0 %
EOS ABS: 0.1 10*3/uL (ref 0.0–0.7)
EOS PCT: 1 %
HCT: 44.6 % (ref 39.0–52.0)
Hemoglobin: 15 g/dL (ref 13.0–17.0)
Lymphocytes Relative: 13 %
Lymphs Abs: 1.9 10*3/uL (ref 0.7–4.0)
MCH: 31.1 pg (ref 26.0–34.0)
MCHC: 33.6 g/dL (ref 30.0–36.0)
MCV: 92.5 fL (ref 78.0–100.0)
MONO ABS: 1.1 10*3/uL — AB (ref 0.1–1.0)
Monocytes Relative: 8 %
Neutro Abs: 11.6 10*3/uL — ABNORMAL HIGH (ref 1.7–7.7)
Neutrophils Relative %: 78 %
PLATELETS: 283 10*3/uL (ref 150–400)
RBC: 4.82 MIL/uL (ref 4.22–5.81)
RDW: 13.4 % (ref 11.5–15.5)
WBC: 14.7 10*3/uL — AB (ref 4.0–10.5)

## 2016-09-03 LAB — COMPREHENSIVE METABOLIC PANEL
ALT: 30 U/L (ref 17–63)
AST: 27 U/L (ref 15–41)
Albumin: 4.4 g/dL (ref 3.5–5.0)
Alkaline Phosphatase: 71 U/L (ref 38–126)
Anion gap: 9 (ref 5–15)
BUN: 13 mg/dL (ref 6–20)
CALCIUM: 9.7 mg/dL (ref 8.9–10.3)
CO2: 24 mmol/L (ref 22–32)
CREATININE: 1.17 mg/dL (ref 0.61–1.24)
Chloride: 106 mmol/L (ref 101–111)
Glucose, Bld: 142 mg/dL — ABNORMAL HIGH (ref 65–99)
Potassium: 4.1 mmol/L (ref 3.5–5.1)
SODIUM: 139 mmol/L (ref 135–145)
Total Bilirubin: 0.8 mg/dL (ref 0.3–1.2)
Total Protein: 7.7 g/dL (ref 6.5–8.1)

## 2016-09-03 LAB — I-STAT TROPONIN, ED: TROPONIN I, POC: 0.01 ng/mL (ref 0.00–0.08)

## 2016-09-03 LAB — CK: CK TOTAL: 167 U/L (ref 49–397)

## 2016-09-03 LAB — I-STAT CG4 LACTIC ACID, ED: LACTIC ACID, VENOUS: 1.76 mmol/L (ref 0.5–1.9)

## 2016-09-03 MED ORDER — HYDROMORPHONE HCL 1 MG/ML IJ SOLN
1.0000 mg | Freq: Once | INTRAMUSCULAR | Status: AC
  Administered 2016-09-03: 1 mg via INTRAVENOUS

## 2016-09-03 MED ORDER — SODIUM CHLORIDE 0.9 % IV BOLUS (SEPSIS)
1000.0000 mL | Freq: Once | INTRAVENOUS | Status: AC
  Administered 2016-09-03: 1000 mL via INTRAVENOUS

## 2016-09-03 MED ORDER — HYDROMORPHONE HCL 1 MG/ML IJ SOLN
INTRAMUSCULAR | Status: AC
  Filled 2016-09-03: qty 1

## 2016-09-03 NOTE — Progress Notes (Signed)
Chaplain introduced herself to patient.  Patient sitting up in bed and said that he was feeling okay, just sore all over.  Patient has his phone in hand and shares that his girlfriend was getting ready to get on a plane and that she would be home tomorrow so he would wait until then to inform her.  He states that the voltage took him by complete surprise and he is wondering if there is some bad wiring somewhere. Patient seems to be doing okay otherwise.  Chaplain will remain available as additional support is needed.  Please page if needed.    09/03/16 1346  Clinical Encounter Type  Visited With Patient  Visit Type Initial;Psychological support;Spiritual support;Social support;ED;Trauma  Referral From Nurse  Consult/Referral To Chaplain
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
## 2016-09-03 NOTE — ED Notes (Signed)
Ambulatory steady gait.
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
## 2016-09-03 NOTE — ED Notes (Signed)
Cleaned left palm with wound cleanser tolerated well no pain or drainage present.
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
## 2016-09-04 ENCOUNTER — Encounter (HOSPITAL_COMMUNITY): Payer: Self-pay | Admitting: Family Medicine

## 2016-09-09 DIAGNOSIS — Z09 Encounter for follow-up examination after completed treatment for conditions other than malignant neoplasm: Secondary | ICD-10-CM | POA: Diagnosis not present

## 2016-09-09 DIAGNOSIS — M791 Myalgia: Secondary | ICD-10-CM | POA: Diagnosis not present

## 2016-09-09 DIAGNOSIS — Y33XXXD Other specified events, undetermined intent, subsequent encounter: Secondary | ICD-10-CM | POA: Diagnosis not present

## 2016-09-09 DIAGNOSIS — J069 Acute upper respiratory infection, unspecified: Secondary | ICD-10-CM | POA: Diagnosis not present

## 2016-12-05 DIAGNOSIS — Z125 Encounter for screening for malignant neoplasm of prostate: Secondary | ICD-10-CM | POA: Diagnosis not present

## 2016-12-05 DIAGNOSIS — F17200 Nicotine dependence, unspecified, uncomplicated: Secondary | ICD-10-CM | POA: Diagnosis not present

## 2016-12-05 DIAGNOSIS — I10 Essential (primary) hypertension: Secondary | ICD-10-CM | POA: Diagnosis not present

## 2016-12-05 DIAGNOSIS — G4700 Insomnia, unspecified: Secondary | ICD-10-CM | POA: Diagnosis not present

## 2016-12-05 DIAGNOSIS — R739 Hyperglycemia, unspecified: Secondary | ICD-10-CM | POA: Diagnosis not present

## 2016-12-05 DIAGNOSIS — Z23 Encounter for immunization: Secondary | ICD-10-CM | POA: Diagnosis not present

## 2016-12-05 DIAGNOSIS — G894 Chronic pain syndrome: Secondary | ICD-10-CM | POA: Diagnosis not present

## 2016-12-05 DIAGNOSIS — E782 Mixed hyperlipidemia: Secondary | ICD-10-CM | POA: Diagnosis not present

## 2017-01-14 DIAGNOSIS — M25512 Pain in left shoulder: Secondary | ICD-10-CM | POA: Diagnosis not present

## 2017-01-14 DIAGNOSIS — M25511 Pain in right shoulder: Secondary | ICD-10-CM | POA: Diagnosis not present

## 2017-01-14 DIAGNOSIS — J209 Acute bronchitis, unspecified: Secondary | ICD-10-CM | POA: Diagnosis not present

## 2017-01-14 DIAGNOSIS — N5201 Erectile dysfunction due to arterial insufficiency: Secondary | ICD-10-CM | POA: Diagnosis not present

## 2017-02-01 ENCOUNTER — Encounter (HOSPITAL_COMMUNITY): Payer: Self-pay | Admitting: Emergency Medicine

## 2017-02-01 ENCOUNTER — Emergency Department (HOSPITAL_COMMUNITY)
Admission: EM | Admit: 2017-02-01 | Discharge: 2017-02-01 | Disposition: A | Discharge status: Home / Self Care | Payer: Medicare Other | Attending: Emergency Medicine | Admitting: Emergency Medicine

## 2017-02-01 ENCOUNTER — Emergency Department (HOSPITAL_COMMUNITY): Payer: Medicare Other

## 2017-02-01 DIAGNOSIS — M25551 Pain in right hip: Secondary | ICD-10-CM

## 2017-02-01 DIAGNOSIS — Z79899 Other long term (current) drug therapy: Secondary | ICD-10-CM | POA: Insufficient documentation

## 2017-02-01 DIAGNOSIS — M87051 Idiopathic aseptic necrosis of right femur: Secondary | ICD-10-CM | POA: Insufficient documentation

## 2017-02-01 DIAGNOSIS — M5441 Lumbago with sciatica, right side: Secondary | ICD-10-CM | POA: Diagnosis not present

## 2017-02-01 DIAGNOSIS — I10 Essential (primary) hypertension: Secondary | ICD-10-CM | POA: Insufficient documentation

## 2017-02-01 DIAGNOSIS — F17210 Nicotine dependence, cigarettes, uncomplicated: Secondary | ICD-10-CM | POA: Diagnosis not present

## 2017-02-01 DIAGNOSIS — Z96612 Presence of left artificial shoulder joint: Secondary | ICD-10-CM | POA: Insufficient documentation

## 2017-02-01 DIAGNOSIS — I96 Gangrene, not elsewhere classified: Secondary | ICD-10-CM | POA: Diagnosis not present

## 2017-02-01 DIAGNOSIS — M5431 Sciatica, right side: Secondary | ICD-10-CM | POA: Diagnosis not present

## 2017-02-01 DIAGNOSIS — Z7982 Long term (current) use of aspirin: Secondary | ICD-10-CM | POA: Insufficient documentation

## 2017-02-01 DIAGNOSIS — S79911A Unspecified injury of right hip, initial encounter: Secondary | ICD-10-CM | POA: Diagnosis not present

## 2017-02-01 DIAGNOSIS — M8700 Idiopathic aseptic necrosis of unspecified bone: Secondary | ICD-10-CM

## 2017-02-01 MED ORDER — ACETAMINOPHEN 500 MG PO TABS
1000.0000 mg | ORAL_TABLET | Freq: Once | ORAL | Status: AC
  Administered 2017-02-01: 1000 mg via ORAL
  Filled 2017-02-01: qty 2

## 2017-02-01 NOTE — Discharge Instructions (Signed)
As discussed, your x-ray did not show any acute injury such as a fracture or dislocation today.  It did show chronic avascular necrosis which is deterioration of the bone when there is poor blood supply.  Follow-up with your orthopedic doctor and revisit the discussion regarding need for hip replacement in the future.  Avoid wearing your wallet in your back pocket as it may exacerbate your sciatica of the right leg. Follow the instructions provided in this discharge summary to help relieve your symptoms and with modification of activity.  Call your primary care provider regarding pain medications and management.  Return if symptoms worsen or new concerning symptoms in the meantime.
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
## 2017-02-01 NOTE — ED Notes (Signed)
Bed: WTR9 Expected date:  Expected time:  Means of arrival:  Comments: 

## 2017-02-01 NOTE — ED Triage Notes (Signed)
Pt ambulated to bathroom with supervision. Did not require assistance. Limping due to r/hip pain
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
## 2017-02-09 DIAGNOSIS — M25551 Pain in right hip: Secondary | ICD-10-CM | POA: Diagnosis not present

## 2017-02-09 DIAGNOSIS — M545 Low back pain: Secondary | ICD-10-CM | POA: Diagnosis not present

## 2017-03-18 DIAGNOSIS — G894 Chronic pain syndrome: Secondary | ICD-10-CM | POA: Diagnosis not present

## 2017-03-18 DIAGNOSIS — F17200 Nicotine dependence, unspecified, uncomplicated: Secondary | ICD-10-CM | POA: Diagnosis not present

## 2017-03-18 DIAGNOSIS — G4700 Insomnia, unspecified: Secondary | ICD-10-CM | POA: Diagnosis not present

## 2017-03-18 DIAGNOSIS — M545 Low back pain: Secondary | ICD-10-CM | POA: Diagnosis not present

## 2017-03-18 DIAGNOSIS — E782 Mixed hyperlipidemia: Secondary | ICD-10-CM | POA: Diagnosis not present

## 2017-03-18 DIAGNOSIS — R7303 Prediabetes: Secondary | ICD-10-CM | POA: Diagnosis not present

## 2017-03-18 DIAGNOSIS — I10 Essential (primary) hypertension: Secondary | ICD-10-CM | POA: Diagnosis not present

## 2017-06-24 DIAGNOSIS — R7303 Prediabetes: Secondary | ICD-10-CM | POA: Diagnosis not present

## 2017-06-24 DIAGNOSIS — M545 Low back pain: Secondary | ICD-10-CM | POA: Diagnosis not present

## 2017-06-24 DIAGNOSIS — F17200 Nicotine dependence, unspecified, uncomplicated: Secondary | ICD-10-CM | POA: Diagnosis not present

## 2017-06-24 DIAGNOSIS — I10 Essential (primary) hypertension: Secondary | ICD-10-CM | POA: Diagnosis not present

## 2017-06-24 DIAGNOSIS — G4700 Insomnia, unspecified: Secondary | ICD-10-CM | POA: Diagnosis not present

## 2017-06-24 DIAGNOSIS — E782 Mixed hyperlipidemia: Secondary | ICD-10-CM | POA: Diagnosis not present

## 2017-11-27 DIAGNOSIS — G4700 Insomnia, unspecified: Secondary | ICD-10-CM | POA: Diagnosis not present

## 2017-11-27 DIAGNOSIS — G894 Chronic pain syndrome: Secondary | ICD-10-CM | POA: Diagnosis not present

## 2017-11-27 DIAGNOSIS — E782 Mixed hyperlipidemia: Secondary | ICD-10-CM | POA: Diagnosis not present

## 2017-11-27 DIAGNOSIS — Z23 Encounter for immunization: Secondary | ICD-10-CM | POA: Diagnosis not present

## 2017-11-27 DIAGNOSIS — I10 Essential (primary) hypertension: Secondary | ICD-10-CM | POA: Diagnosis not present

## 2017-11-27 DIAGNOSIS — Z125 Encounter for screening for malignant neoplasm of prostate: Secondary | ICD-10-CM | POA: Diagnosis not present

## 2017-11-27 DIAGNOSIS — M25512 Pain in left shoulder: Secondary | ICD-10-CM | POA: Diagnosis not present

## 2017-11-27 DIAGNOSIS — K219 Gastro-esophageal reflux disease without esophagitis: Secondary | ICD-10-CM | POA: Diagnosis not present

## 2017-11-27 DIAGNOSIS — R7303 Prediabetes: Secondary | ICD-10-CM | POA: Diagnosis not present

## 2017-11-27 DIAGNOSIS — F17200 Nicotine dependence, unspecified, uncomplicated: Secondary | ICD-10-CM | POA: Diagnosis not present

## 2017-11-27 DIAGNOSIS — M545 Low back pain: Secondary | ICD-10-CM | POA: Diagnosis not present

## 2018-03-16 ENCOUNTER — Other Ambulatory Visit: Payer: Self-pay

## 2018-03-16 ENCOUNTER — Emergency Department (HOSPITAL_COMMUNITY)
Admission: EM | Admit: 2018-03-16 | Discharge: 2018-03-16 | Disposition: A | Discharge status: Home / Self Care | Payer: Medicare Other | Attending: Emergency Medicine | Admitting: Emergency Medicine

## 2018-03-16 ENCOUNTER — Encounter (HOSPITAL_COMMUNITY): Payer: Self-pay | Admitting: *Deleted

## 2018-03-16 ENCOUNTER — Emergency Department (HOSPITAL_COMMUNITY): Payer: Medicare Other

## 2018-03-16 DIAGNOSIS — F17210 Nicotine dependence, cigarettes, uncomplicated: Secondary | ICD-10-CM | POA: Diagnosis not present

## 2018-03-16 DIAGNOSIS — J101 Influenza due to other identified influenza virus with other respiratory manifestations: Secondary | ICD-10-CM

## 2018-03-16 DIAGNOSIS — Z79899 Other long term (current) drug therapy: Secondary | ICD-10-CM | POA: Insufficient documentation

## 2018-03-16 DIAGNOSIS — Z96612 Presence of left artificial shoulder joint: Secondary | ICD-10-CM | POA: Insufficient documentation

## 2018-03-16 DIAGNOSIS — R197 Diarrhea, unspecified: Secondary | ICD-10-CM | POA: Diagnosis not present

## 2018-03-16 DIAGNOSIS — R112 Nausea with vomiting, unspecified: Secondary | ICD-10-CM | POA: Diagnosis not present

## 2018-03-16 DIAGNOSIS — I10 Essential (primary) hypertension: Secondary | ICD-10-CM | POA: Insufficient documentation

## 2018-03-16 DIAGNOSIS — R110 Nausea: Secondary | ICD-10-CM

## 2018-03-16 DIAGNOSIS — R000 Tachycardia, unspecified: Secondary | ICD-10-CM | POA: Diagnosis not present

## 2018-03-16 DIAGNOSIS — R9431 Abnormal electrocardiogram [ECG] [EKG]: Secondary | ICD-10-CM | POA: Insufficient documentation

## 2018-03-16 DIAGNOSIS — R040 Epistaxis: Secondary | ICD-10-CM | POA: Diagnosis present

## 2018-03-16 DIAGNOSIS — R918 Other nonspecific abnormal finding of lung field: Secondary | ICD-10-CM | POA: Diagnosis not present

## 2018-03-16 LAB — CBC
HEMATOCRIT: 43.7 % (ref 39.0–52.0)
Hemoglobin: 14.5 g/dL (ref 13.0–17.0)
MCH: 31.3 pg (ref 26.0–34.0)
MCHC: 33.2 g/dL (ref 30.0–36.0)
MCV: 94.2 fL (ref 80.0–100.0)
NRBC: 0 % (ref 0.0–0.2)
PLATELETS: 181 10*3/uL (ref 150–400)
RBC: 4.64 MIL/uL (ref 4.22–5.81)
RDW: 12.9 % (ref 11.5–15.5)
WBC: 6.2 10*3/uL (ref 4.0–10.5)

## 2018-03-16 LAB — URINALYSIS, ROUTINE W REFLEX MICROSCOPIC
Bilirubin Urine: NEGATIVE
GLUCOSE, UA: NEGATIVE mg/dL
KETONES UR: NEGATIVE mg/dL
LEUKOCYTE UA: NEGATIVE
NITRITE: NEGATIVE
PH: 5 (ref 5.0–8.0)
Protein, ur: 30 mg/dL — AB
SPECIFIC GRAVITY, URINE: 1.029 (ref 1.005–1.030)

## 2018-03-16 LAB — TROPONIN I

## 2018-03-16 LAB — COMPREHENSIVE METABOLIC PANEL
ALBUMIN: 4.6 g/dL (ref 3.5–5.0)
ALK PHOS: 60 U/L (ref 38–126)
ALT: 28 U/L (ref 0–44)
ANION GAP: 11 (ref 5–15)
AST: 33 U/L (ref 15–41)
BUN: 16 mg/dL (ref 6–20)
CHLORIDE: 105 mmol/L (ref 98–111)
CO2: 22 mmol/L (ref 22–32)
Calcium: 9.1 mg/dL (ref 8.9–10.3)
Creatinine, Ser: 1.05 mg/dL (ref 0.61–1.24)
GFR calc non Af Amer: >60 mL/min (ref >60)
Glucose, Bld: 133 mg/dL — ABNORMAL HIGH (ref 70–99)
Potassium: 3.6 mmol/L (ref 3.5–5.1)
Sodium: 138 mmol/L (ref 135–145)
TOTAL PROTEIN: 7.7 g/dL (ref 6.5–8.1)
Total Bilirubin: 0.4 mg/dL (ref 0.3–1.2)

## 2018-03-16 LAB — INFLUENZA PANEL BY PCR (TYPE A & B)
Influenza A By PCR: POSITIVE — AB
Influenza B By PCR: NEGATIVE

## 2018-03-16 LAB — LIPASE, BLOOD: Lipase: 39 U/L (ref 11–51)

## 2018-03-16 MED ORDER — ALBUTEROL SULFATE HFA 108 (90 BASE) MCG/ACT IN AERS
2.0000 | INHALATION_SPRAY | Freq: Once | RESPIRATORY_TRACT | Status: AC
  Administered 2018-03-16: 2 via RESPIRATORY_TRACT
  Filled 2018-03-16: qty 6.7

## 2018-03-16 MED ORDER — FENTANYL CITRATE (PF) 100 MCG/2ML IJ SOLN
50.0000 ug | Freq: Once | INTRAMUSCULAR | Status: AC
  Administered 2018-03-16: 50 ug via INTRAVENOUS
  Filled 2018-03-16: qty 2

## 2018-03-16 MED ORDER — ONDANSETRON HCL 4 MG PO TABS: 4.0000 mg | ORAL_TABLET | Freq: Three times a day (TID) | ORAL | 0 refills | Status: AC | PRN

## 2018-03-16 MED ORDER — OSELTAMIVIR PHOSPHATE 75 MG PO CAPS: 75.0000 mg | ORAL_CAPSULE | Freq: Two times a day (BID) | ORAL | 0 refills | Status: DC

## 2018-03-16 MED ORDER — ONDANSETRON HCL 4 MG/2ML IJ SOLN
4.0000 mg | Freq: Once | INTRAMUSCULAR | Status: AC
  Administered 2018-03-16: 4 mg via INTRAVENOUS
  Filled 2018-03-16: qty 2

## 2018-03-16 MED ORDER — METHYLPREDNISOLONE SODIUM SUCC 125 MG IJ SOLR
125.0000 mg | Freq: Once | INTRAMUSCULAR | Status: AC
  Administered 2018-03-16: 125 mg via INTRAVENOUS
  Filled 2018-03-16: qty 2

## 2018-03-16 MED ORDER — SODIUM CHLORIDE 0.9% FLUSH
3.0000 mL | Freq: Once | INTRAVENOUS | Status: AC
  Administered 2018-03-16: 3 mL via INTRAVENOUS

## 2018-03-16 MED ORDER — OSELTAMIVIR PHOSPHATE 75 MG PO CAPS
75.0000 mg | ORAL_CAPSULE | Freq: Once | ORAL | Status: AC
  Administered 2018-03-16: 75 mg via ORAL
  Filled 2018-03-16: qty 1

## 2018-03-16 MED ORDER — LACTATED RINGERS IV BOLUS
1000.0000 mL | Freq: Once | INTRAVENOUS | Status: AC
  Administered 2018-03-16: 1000 mL via INTRAVENOUS

## 2018-03-16 MED ORDER — ACETAMINOPHEN 500 MG PO TABS
1000.0000 mg | ORAL_TABLET | Freq: Once | ORAL | Status: AC
  Administered 2018-03-16: 1000 mg via ORAL
  Filled 2018-03-16: qty 2

## 2018-03-16 NOTE — ED Triage Notes (Signed)
Pt reports feeling weak, nausea, and coughing that started last night.  Pt states that he woke up with his nose bleeding.  Pt states he used almost a roll of paper towels to stop the bleed.  Pt states that the bleeding is controlled now.  Pt has only been able to keep orange juice down at home.  Pt states some discomfort in his chest with deep inspiration.

## 2018-03-16 NOTE — ED Notes (Signed)
Patient tolerating PO fluids at this time.

## 2018-03-16 NOTE — ED Provider Notes (Signed)
Emergency Department Provider Note   I have reviewed the tri[facility]MIR CANDEL[facility] signs and the nursing notes.   HISTORY  Chief Complaint Epistaxis and Nausea   HPI Samuel Gibson is a 63 y.o. male with medical problems as documented below the presents to the emergency department today with multiple symptoms.  Patient states that he had nosebleeding last night after some coughing, vomiting and diarrhea.  He states that it lasted approximate 5 to 10 minutes but pretty profusely but has not bled since then.  Since that time he had multiple episodes of nonbloody nonbilious vomiting and nonbloody diarrhea.  He states he has no sick contacts.  He is felt febrile.  Did not get a flu shot this year.  Has had some cough as well.  Is a smoker about 10 cigarettes a day.  No alcohol or drugs.  No recent travels. No other associated or modifying symptoms.    Past Medical History:  Diagnosis Date  . Anxiety   . Arthritis   . Degenerative arthritis of lumbar spine   . Hernia of pelvic floor   . History of kidney stones   . Hypertension     Patient Active Problem List   Diagnosis Date Noted  . Back pain 01/14/2016  . History of lumbar surgery 01/14/2016  . Essential hypertension 01/14/2016  . Hx of renal calculi 01/14/2016  . Chronic pain 01/14/2016  . Primary osteoarthritis, left shoulder 01/14/2016    Past Surgical History:  Procedure Laterality Date  . BACK SURGERY    . SHOULDER SURGERY    . 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 TOTAL SHOULDER ARTHROPLASTY Left 01/29/2016   Procedure: TOTAL SHOULDER ARTHROPLASTY;  Surgeon: Sheral Apley, MD;  Location: [facility] OR;  Service: Orthopedics;  Laterality: Left;    Current Outpatient Rx  . Order #: [id number] Class: Print  . Order #: [id number] Class: Historical Med  . Order #: [id number] Class: Historical Med  . Order #: [id number] Class: Historical Med  . Order #: [id number] Class: Historical Med  . Order #: [id number] Class: Print  . Order #:  [id number] Class: Print    Allergies No known allergies  No family history on file.  Social History Social History   Tobacco Use  . Smoking status: Current Every Day Smoker    Packs/day: 0.50    Years: 34.00    Pack years: 17.00    Types: Cigarettes  . Smokeless tobacco: Never Used  Substance Use Topics  . Alcohol use: No  . Drug use: No    Review of Systems  All other systems negative except as documented in the HPI. All pertinent positives and negatives as reviewed in the HPI. ____________________________________________   PHYSICAL EXAM:  VITAL SIGNS: ED Triage Vitals  Enc Vitals Group     BP 03/16/18 1247 122/76     Pulse Rate 03/16/18 1247 (!) 101     Resp 03/16/18 1247 20     Temp 03/16/18 1247 99.3 F (37.4 C)     Temp Source 03/16/18 1247 Oral     SpO2 03/16/18 1247 93 %     Weight 03/16/18 1248 220 lb (99.8 kg)     Height 03/16/18 1248  (1.778 m)    Constitutional: Alert and oriented. Well appearing and in no acute distress. Eyes: Conjunctivae are normal. PERRL. EOMI. Head: Atraumatic. Nose: No congestion/rhinnorhea. Mouth/Throat: Mucous membranes are moist.  Oropharynx non-erythematous. Neck: No stridor.  No meningeal signs.   Cardiovascular: Normal rate, regular rhythm. Good peripheral circulation. Grossly  normal heart sounds.   Respiratory: Normal respiratory effort.  No retractions. Lungs with wheezing. Gastrointestinal: Soft and nontender. No distention.  Musculoskeletal: No lower extremity tenderness nor edema. No gross deformities of extremities. Neurologic:  Normal speech and language. No gross focal neurologic deficits are appreciated.  Skin:  Skin is warm, dry and intact. No rash noted.   ____________________________________________   LABS (all labs ordered are listed, but only abnormal results are displayed)  Labs Reviewed  COMPREHENSIVE METABOLIC PANEL - Abnormal; Notable for the following components:      Result Value    Glucose, Bld 133 (*)    All other components within normal limits  URINALYSIS, ROUTINE W REFLEX MICROSCOPIC - Abnormal; Notable for the following components:   APPearance HAZY (*)    Hgb urine dipstick LARGE (*)    Protein, ur 30 (*)    Bacteria, UA RARE (*)    All other components within normal limits  INFLUENZA PANEL BY PCR (TYPE A & B) - Abnormal; Notable for the following components:   Influenza A By PCR POSITIVE (*)    All other components within normal limits  LIPASE, BLOOD  CBC  TROPONIN I   ____________________________________________  EKG   EKG Interpretation  Date/Time:  Tuesday March 16 2018 13:00:20 EDT Ventricular Rate:  100 PR Interval:    QRS Duration: 92 QT Interval:  329 QTC Calculation: 425 R Axis:   81 Text Interpretation:  Sinus tachycardia Borderline right axis deviation Borderline ST depression, diffuse leads ST depression new from most recent Confirmed by Marily Memos [phone]) on 03/16/2018 4:43:52 PM       ____________________________________________  RADIOLOGY  Dg Chest 2 View  Result Date: 03/16/2018 CLINICAL DATA:  Initial evaluation for acute cough, fever. EXAM: CHEST - 2 VIEW COMPARISON:  Prior radiograph from 05/05/2016. FINDINGS: Cardiac and mediastinal silhouettes within normal limits. Lungs normally inflated. Perihilar vascular congestion without overt pulmonary edema. Scattered peribronchial thickening with superimposed hazy opacity at the right lung base, suspicious for possible infectious pneumonitis given provided history. No edema or effusion. No pneumothorax. No acute osseous finding. Reverse left shoulder hemiarthroplasty noted. IMPRESSION: Scattered diffuse peribronchial thickening with superimposed hazy right lower lobe opacity, suspicious for infectious pneumonitis given provided history of cough and fever. Electronically Signed   By: Rise Mu M.D.   On: 03/16/2018 17:37     ____________________________________________   PROCEDURES  Procedure(s) performed:   Procedures   ____________________________________________   INITIAL IMPRESSION / ASSESSMENT AND PLAN / ED COURSE  Here with vomiting diarrhea after nosebleed last night.  Also with cough and history of smoking.  Will get a chest x-ray treat for possible bronchitis.  Also treat for his abdominal pain is not localized and no evidence of peritonitis to suggest need for imaging.  Will check labs.  EKG done prior to my evaluation did show that he had some ST depressions will add on a troponin.  Patient had no chest pain or shortness of breath on my exam.  Found to have influenza. Will treat for same. Was much improved. Tolerating PO.   Pertinent labs & imaging results that were available during my care of the patient were reviewed by me and considered in my medical decision making (see chart for details).  ____________________________________________  FINAL CLINICAL IMPRESSION(S) / ED DIAGNOSES  Final diagnoses:  Influenza A  Nausea  Non-intractable vomiting with nausea, unspecified vomiting type     MEDICATIONS GIVEN DURING THIS VISIT:  Medications  sodium chloride flush (NS)  0.9 % injection 3 mL (3 mLs Intravenous Given 03/16/18 1552)  fentaNYL (SUBLIMAZE) injection 50 mcg (50 mcg Intravenous Given 03/16/18 1549)  ondansetron (ZOFRAN) injection 4 mg (4 mg Intravenous Given 03/16/18 1548)  lactated ringers bolus 1,000 mL (0 mLs Intravenous Stopped 03/16/18 1726)  methylPREDNISolone sodium succinate (SOLU-MEDROL) 125 mg/2 mL injection 125 mg (125 mg Intravenous Given 03/16/18 1547)  albuterol (PROVENTIL HFA;VENTOLIN HFA) 108 (90 Base) MCG/ACT inhaler 2 puff (2 puffs Inhalation Given 03/16/18 1552)  acetaminophen (TYLENOL) tablet 1,000 mg (1,000 mg Oral Given 03/16/18 1552)  oseltamivir (TAMIFLU) capsule 75 mg (75 mg Oral Given 03/16/18 2109)     NEW OUTPATIENT MEDICATIONS STARTED DURING THIS  VISIT:  Discharge Medication List as of 03/16/2018  8:59 PM    START taking these medications   Details  oseltamivir (TAMIFLU) 75 MG capsule Take 1 capsule (75 mg total) by mouth every 12 (twelve) hours., Starting Tue 03/16/2018, Print        Note:  This note was prepared with assistance of Dragon voice recognition software. Occasional wrong-word or sound-a-like substitutions may have occurred due to the inherent limitations of voice recognition software.   Marily Memos, MD 03/16/18 2328

## 2018-03-16 NOTE — ED Notes (Signed)
Patient instructed to please provide a urine sample

## 2018-03-24 ENCOUNTER — Other Ambulatory Visit: Payer: Self-pay

## 2018-03-24 ENCOUNTER — Emergency Department (HOSPITAL_COMMUNITY): Payer: Medicare Other

## 2018-03-24 ENCOUNTER — Emergency Department (HOSPITAL_COMMUNITY)
Admission: EM | Admit: 2018-03-24 | Discharge: 2018-03-24 | Disposition: A | Discharge status: Home / Self Care | Payer: Medicare Other | Attending: Emergency Medicine | Admitting: Emergency Medicine

## 2018-03-24 ENCOUNTER — Encounter (HOSPITAL_COMMUNITY): Payer: Self-pay | Admitting: Emergency Medicine

## 2018-03-24 DIAGNOSIS — F17210 Nicotine dependence, cigarettes, uncomplicated: Secondary | ICD-10-CM | POA: Diagnosis not present

## 2018-03-24 DIAGNOSIS — J40 Bronchitis, not specified as acute or chronic: Secondary | ICD-10-CM | POA: Diagnosis not present

## 2018-03-24 DIAGNOSIS — R070 Pain in throat: Secondary | ICD-10-CM | POA: Diagnosis not present

## 2018-03-24 DIAGNOSIS — R05 Cough: Secondary | ICD-10-CM | POA: Diagnosis not present

## 2018-03-24 DIAGNOSIS — R0602 Shortness of breath: Secondary | ICD-10-CM | POA: Diagnosis not present

## 2018-03-24 DIAGNOSIS — Z79899 Other long term (current) drug therapy: Secondary | ICD-10-CM | POA: Diagnosis not present

## 2018-03-24 DIAGNOSIS — Z7982 Long term (current) use of aspirin: Secondary | ICD-10-CM | POA: Diagnosis not present

## 2018-03-24 DIAGNOSIS — I10 Essential (primary) hypertension: Secondary | ICD-10-CM | POA: Insufficient documentation

## 2018-03-24 DIAGNOSIS — R079 Chest pain, unspecified: Secondary | ICD-10-CM | POA: Diagnosis not present

## 2018-03-24 DIAGNOSIS — R112 Nausea with vomiting, unspecified: Secondary | ICD-10-CM | POA: Diagnosis not present

## 2018-03-24 DIAGNOSIS — R0902 Hypoxemia: Secondary | ICD-10-CM | POA: Diagnosis not present

## 2018-03-24 LAB — CBC WITH DIFFERENTIAL/PLATELET
Abs Immature Granulocytes: 0.11 10*3/uL — ABNORMAL HIGH (ref 0.00–0.07)
Basophils Absolute: 0 10*3/uL (ref 0.0–0.1)
Basophils Relative: 0 %
Eosinophils Absolute: 0.1 10*3/uL (ref 0.0–0.5)
Eosinophils Relative: 0 %
HCT: 43.5 % (ref 39.0–52.0)
Hemoglobin: 14.1 g/dL (ref 13.0–17.0)
IMMATURE GRANULOCYTES: 1 %
Lymphocytes Relative: 7 %
Lymphs Abs: 1.4 10*3/uL (ref 0.7–4.0)
MCH: 29.9 pg (ref 26.0–34.0)
MCHC: 32.4 g/dL (ref 30.0–36.0)
MCV: 92.4 fL (ref 80.0–100.0)
MONOS PCT: 11 %
Monocytes Absolute: 2.2 10*3/uL — ABNORMAL HIGH (ref 0.1–1.0)
Neutro Abs: 15.5 10*3/uL — ABNORMAL HIGH (ref 1.7–7.7)
Neutrophils Relative %: 81 %
Platelets: 226 10*3/uL (ref 150–400)
RBC: 4.71 MIL/uL (ref 4.22–5.81)
RDW: 12.3 % (ref 11.5–15.5)
WBC: 19.3 10*3/uL — ABNORMAL HIGH (ref 4.0–10.5)
nRBC: 0 % (ref 0.0–0.2)

## 2018-03-24 LAB — BASIC METABOLIC PANEL
Anion gap: 8 (ref 5–15)
BUN: 11 mg/dL (ref 6–20)
CO2: 27 mmol/L (ref 22–32)
Calcium: 9.6 mg/dL (ref 8.9–10.3)
Chloride: 105 mmol/L (ref 98–111)
Creatinine, Ser: 1.01 mg/dL (ref 0.61–1.24)
GFR calc Af Amer: >60 mL/min (ref >60)
GFR calc non Af Amer: >60 mL/min (ref >60)
Glucose, Bld: 106 mg/dL — ABNORMAL HIGH (ref 70–99)
Potassium: 4 mmol/L (ref 3.5–5.1)
Sodium: 140 mmol/L (ref 135–145)

## 2018-03-24 MED ORDER — SODIUM CHLORIDE 0.9 % IV BOLUS
1000.0000 mL | Freq: Once | INTRAVENOUS | Status: AC
  Administered 2018-03-24: 1000 mL via INTRAVENOUS

## 2018-03-24 MED ORDER — ACETAMINOPHEN 325 MG PO TABS
650.0000 mg | ORAL_TABLET | Freq: Once | ORAL | Status: AC
  Administered 2018-03-24: 650 mg via ORAL
  Filled 2018-03-24: qty 2

## 2018-03-24 MED ORDER — ONDANSETRON 4 MG PO TBDP: 4.0000 mg | ORAL_TABLET | Freq: Three times a day (TID) | ORAL | 0 refills | Status: AC | PRN

## 2018-03-24 MED ORDER — DOXYCYCLINE HYCLATE 100 MG PO CAPS: 100.0000 mg | ORAL_CAPSULE | Freq: Two times a day (BID) | ORAL | 0 refills | Status: DC

## 2018-03-24 NOTE — Discharge Instructions (Addendum)
You have been diagnosed today with bronchitis.  At this time there does not appear to be the presence of an emergent medical condition, however there is always the potential for conditions to change. Please read and follow the below instructions.  Please return to the Emergency Department immediately for any new or worsening symptoms or if your symptoms do not improve within 3 days. Please be sure to follow up with your Primary Care Provider within one week regarding your visit today; please call their office to schedule an appointment even if you are feeling better for a follow-up visit. Please take the antibiotic doxycycline as prescribed twice daily for the next 10 days to help with your symptoms. Additionally you may use the medication Zofran as prescribed to help with nausea and vomiting. Please drink plenty of water and get plenty of rest over the next few days to help with your symptoms. Please take Ibuprofen (Advil, motrin) and Tylenol (acetaminophen) to relieve your pain.  You may take up to 400 MG (2 pills) of normal strength ibuprofen every 8 hours as needed.  In between doses of ibuprofen you make take tylenol, up to 500 mg (one extra strength pill).  Do not take more than 3,000 mg tylenol in a 24 hour period.  Please check all medication labels as many medications such as pain and cold medications may contain tylenol.  Do not drink alcohol while taking these medications.  Do not take other NSAID'S while taking ibuprofen (such as aleve or naproxen).  Please take ibuprofen with food to decrease stomach upset.  Get help right away if: You cough up blood. You have chest pain. You have shortness of breath. You become dehydrated. You faint or keep feeling like you are going to faint. You keep vomiting. You have a severe headache. Your fever or chills gets worse. Any new or concerning symptoms   Please read the additional information packets attached to your discharge summary.  Do not  take your medicine if  develop an itchy rash, swelling in your mouth or lips, or difficulty breathing.

## 2018-03-24 NOTE — ED Notes (Signed)
Patient verbalizes understanding of discharge instructions. Opportunity for questioning and answers were provided. Armband removed by staff, pt discharged from ED ambulatory to home.  

## 2018-03-24 NOTE — ED Provider Notes (Signed)
[facility] EMERGENCY DEPARTMENT Provider Note   CSN: [id number] Arrival date & time: 03/24/18  1140    History   Chief Complaint No chief complaint on file.   HPI Samuel Gibson is a 63 y.o. male presented today for continued flulike illness.  Patient was diagnosed with influenza a on 03/16/2018.  At that time he was prescribed Tamiflu 75 mg twice daily x5 days which she has completed.  Patient endorses continuation of symptoms since that time.  Primary concern for patient is his cough, productive with yellow/green sputum.  Patient also endorses intermittent fever of 100 F, generalized body aches and intermittent nausea and vomiting.  Patient reports that since finishing Tamiflu he has not taken any medication, denies any recent anti-inflammatory use.  Endorses one episode of nonbloody/nonbilious emesis this morning without abdominal pain. Tolerating food and drink since that time without recurrent n/v. Patient denies shortness of breath however endorses some pain to his ribs with cough, no pain at rest.  Patient denies any recent travel or known high risk exposure to COVID-19.    HPI  Past Medical History:  Diagnosis Date  . Anxiety   . Arthritis   . Degenerative arthritis of lumbar spine   . Hernia of pelvic floor   . History of kidney stones   . Hypertension     Patient Active Problem List   Diagnosis Date Noted  . Back pain 01/14/2016  . History of lumbar surgery 01/14/2016  . Essential hypertension 01/14/2016  . Hx of renal calculi 01/14/2016  . Chronic pain 01/14/2016  . Primary osteoarthritis, left shoulder 01/14/2016    Past Surgical History:  Procedure Laterality Date  . BACK SURGERY    . SHOULDER SURGERY    . 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 TOTAL SHOULDER ARTHROPLASTY Left 01/29/2016   Procedure: TOTAL SHOULDER ARTHROPLASTY;  Surgeon: Sheral Apley, MD;  Location: [facility] OR;  Service: Orthopedics;  Laterality: Left;         Home Medications    Prior to Admission medications   Medication Sig Start Date End Date Taking? Authorizing Provider  aspirin EC 81 MG tablet Take 1 tablet (81 mg total) by mouth daily. For 30 days for dvt prophylaxis Patient taking differently: Take 81 mg by mouth daily as needed for mild pain. For 30 days for dvt prophylaxis 01/29/16   Albina Billet III, PA-C  atorvastatin (LIPITOR) 40 MG tablet Take 40 mg by mouth daily at 6 PM.    [provider]  doxycycline (VIBRAMYCIN) 100 MG capsule Take 1 capsule (100 mg total) by mouth 2 (two) times daily. 03/24/18   Harlene Salts A, PA-C  metoprolol succinate (TOPROL-XL) 50 MG 24 hr tablet Take 50 mg by mouth at bedtime. Take with or immediately following a meal.     [provider]  ondansetron (ZOFRAN ODT) 4 MG disintegrating tablet Take 1 tablet (4 mg total) by mouth every 8 (eight) hours as needed for nausea or vomiting. 03/24/18   Harlene Salts A, PA-C  ondansetron (ZOFRAN) 4 MG tablet Take 1 tablet (4 mg total) by mouth every 8 (eight) hours as needed for nausea or vomiting. 03/16/18   Mesner, Barbara Cower, MD  oseltamivir (TAMIFLU) 75 MG capsule Take 1 capsule (75 mg total) by mouth every 12 (twelve) hours. 03/16/18   Mesner, Barbara Cower, MD  Oxycodone HCl 20 MG TABS Take 20 mg by mouth every 6 (six) hours as needed (pain).     [provider]  traZODone (DESYREL) 100 MG tablet Take 100 mg by mouth at bedtime.    [provider]    Family History No family history on file.  Social History Social History   Tobacco Use  . Smoking status: Current Every Day Smoker    Packs/day: 0.50    Years: 34.00    Pack years: 17.00    Types: Cigarettes  . Smokeless tobacco: Never Used  Substance Use Topics  . Alcohol use: No  . Drug use: No     Allergies   No known allergies   Review of Systems Review of Systems  Constitutional: Positive for fatigue and fever. Negative for chills.  HENT: Positive for  rhinorrhea. Negative for trouble swallowing and voice change.   Respiratory: Positive for cough. Negative for shortness of breath.   Cardiovascular: Negative.  Negative for chest pain and leg swelling.  Gastrointestinal: Positive for nausea and vomiting. Negative for abdominal pain and diarrhea.  Musculoskeletal: Positive for arthralgias and myalgias.  Neurological: Negative.  Negative for weakness and headaches.  All other systems reviewed and are negative.   Physical Exam Updated Vital Signs BP 112/70   Pulse 81   Temp 99.4 F (37.4 C) (Oral)   Resp (!) 26   Ht  (1.778 m)   Wt 99.8 kg   SpO2 96%   BMI 31.57 kg/m   Physical Exam Constitutional:      General: He is not in acute distress.    Appearance: Normal appearance. He is well-developed. He is not ill-appearing or diaphoretic.  HENT:     Head: Normocephalic and atraumatic.     Right Ear: External ear normal.     Left Ear: External ear normal.     Nose: Nose normal.  Eyes:     General: Vision grossly intact. Gaze aligned appropriately.     Pupils: Pupils are equal, round, and reactive to light.  Neck:     Musculoskeletal: Normal range of motion and neck supple.     Trachea: Trachea and phonation normal. No tracheal tenderness or tracheal deviation.     Meningeal: Brudzinski's sign absent.  Cardiovascular:     Rate and Rhythm: Normal rate and regular rhythm.     Pulses:          Dorsalis pedis pulses are 2+ on the right side and 2+ on the left side.       Posterior tibial pulses are 2+ on the right side and 2+ on the left side.     Heart sounds: Normal heart sounds.  Pulmonary:     Effort: Pulmonary effort is normal. No accessory muscle usage or respiratory distress.     Breath sounds: Normal air entry. Wheezing present. No rhonchi.     Comments: Mild diffuse wheezing bilaterally. Abdominal:     General: There is no distension.     Palpations: Abdomen is soft.     Tenderness: There is no abdominal  tenderness. There is no guarding or rebound.  Musculoskeletal: Normal range of motion.        General: No tenderness.     Right lower leg: No edema.     Left lower leg: No edema.  Skin:    General: Skin is warm and dry.  Neurological:     Mental Status: He is alert.     GCS: GCS eye subscore is 4. GCS verbal subscore is 5. GCS motor subscore is 6.     Comments: Speech is clear and goal  oriented, follows commands Major Cranial nerves without deficit, no facial droop Moves extremities without ataxia, coordination intact  Psychiatric:        Behavior: Behavior normal.      ED Treatments / Results  Labs (all labs ordered are listed, but only abnormal results are displayed) Labs Reviewed  CBC WITH DIFFERENTIAL/PLATELET - Abnormal; Notable for the following components:      Result Value   WBC 19.3 (*)    Neutro Abs 15.5 (*)    Monocytes Absolute 2.2 (*)    Abs Immature Granulocytes 0.11 (*)    All other components within normal limits  BASIC METABOLIC PANEL - Abnormal; Notable for the following components:   Glucose, Bld 106 (*)    All other components within normal limits    EKG EKG Interpretation  Date/Time:  Wednesday March 24 2018 11:44:39 EDT Ventricular Rate:  87 PR Interval:    QRS Duration: 91 QT Interval:  355 QTC Calculation: 427 R Axis:   65 Text Interpretation:  Sinus rhythm Minimal ST depression, inferior leads Since last tracing rate slower Confirmed by Mancel Bale [phone]) on 03/24/2018 11:51:53 AM   Radiology Dg Chest Portable 1 View  Result Date: 03/24/2018 CLINICAL DATA:  Cough and fever for 8 days, diagnosed with influenza A last week EXAM: PORTABLE CHEST 1 VIEW COMPARISON:  03/16/2018 FINDINGS: Normal heart size, mediastinal contours, and pulmonary vascularity. Lungs clear. No infiltrate, pleural effusion or pneumothorax. Prior reverse LEFT shoulder arthroplasty. No acute osseous findings. IMPRESSION: No acute abnormalities. Electronically Signed    By: Ulyses Southward M.D.   On: 03/24/2018 12:39    Procedures Procedures (including critical care time)  Medications Ordered in ED Medications  acetaminophen (TYLENOL) tablet 650 mg (650 mg Oral Given 03/24/18 1243)  sodium chloride 0.9 % bolus 1,000 mL (0 mLs Intravenous Stopped 03/24/18 1422)     Initial Impression / Assessment and Plan / ED Course  I have reviewed the triage vital signs and the nursing notes.  Pertinent labs & imaging results that were available during my care of the patient were reviewed by me and considered in my medical decision making (see chart for details).    63 year old male smoker presenting for continued flulike illness after influenza A diagnosis 8 days ago, completed Tamiflu 3 days ago.  On arrival patient overall well-appearing and in no acute distress.  Lungs with mild wheezing bilaterally.  Abdomen soft nontender.  Patient tolerating p.o. without difficulty.  Patient without chest pain, shortness of breath or abdominal pain.  Vital signs unremarkable.  CBC with leukocytosis of 19.3, discussed with Dr. Effie Shy will cover with Doxy. BMP unremarkable Chest x-ray without acute abnormalities EKG without acute findings reviewed with Dr. Effie Shy  Patient given fluid bolus here and 650 mg of Tylenol - Patient reassessed, resting comfortably no acute distress.  He states improvement of symptoms following fluid bolus and Tylenol today and is requesting discharge.  Suspect patient with bronchitis today he is a smoker, will treat with doxycycline, anti-inflammatories, water rehydration and ODT Zofran as needed every 8.  Do not suspect acute cardiopulmonary etiology of symptoms today. He is without chest pain, abd pain or SOB. Patient without risk factors suspicious for COVID-19.  Patient seems appropriate for outpatient therapy at this time.  At this time there does not appear to be any evidence of an acute emergency medical condition and the patient appears stable for  discharge with appropriate outpatient follow up. Diagnosis was discussed with patient who verbalizes understanding  of care plan and is agreeable to discharge. I have discussed return precautions with patient who verbalizes understanding of return precautions. Patient encouraged to follow-up with their PCP. All questions answered. Patient has been discharged in good condition.  Patient's case discussed with Dr. Effie Shy who agrees with plan to discharge with Doxycycline follow-up.   Note: Portions of this report may have been transcribed using voice recognition software. Every effort was made to ensure accuracy; however, inadvertent computerized transcription errors may still be present. Final Clinical Impressions(s) / ED Diagnoses   Final diagnoses:  Bronchitis    ED Discharge Orders         Ordered    doxycycline (VIBRAMYCIN) 100 MG capsule  2 times daily     03/24/18 1417    ondansetron (ZOFRAN ODT) 4 MG disintegrating tablet  Every 8 hours PRN     03/24/18 1417           Elizabeth Palau 03/24/18 1432    Mancel Bale, MD 03/24/18 1726

## 2018-03-24 NOTE — ED Notes (Signed)
Pt given sprite. Cleared same with PA.

## 2018-03-24 NOTE — ED Triage Notes (Signed)
[facility]- pt here with complaints of fever and cough. Pt seen at [facility] last Tuesday sent home with tamiflu. Pt still has a fever.

## 2018-03-30 DIAGNOSIS — J209 Acute bronchitis, unspecified: Secondary | ICD-10-CM | POA: Diagnosis not present

## 2018-07-07 DIAGNOSIS — M545 Low back pain: Secondary | ICD-10-CM | POA: Diagnosis not present

## 2018-07-07 DIAGNOSIS — M25552 Pain in left hip: Secondary | ICD-10-CM | POA: Diagnosis not present

## 2018-07-21 DIAGNOSIS — M545 Low back pain: Secondary | ICD-10-CM | POA: Diagnosis not present

## 2018-07-22 ENCOUNTER — Telehealth: Payer: Self-pay | Admitting: Nurse Practitioner

## 2018-07-22 ENCOUNTER — Other Ambulatory Visit: Payer: Self-pay | Admitting: Orthopedic Surgery

## 2018-07-22 DIAGNOSIS — M545 Low back pain, unspecified: Secondary | ICD-10-CM

## 2018-07-22 DIAGNOSIS — G8929 Other chronic pain: Secondary | ICD-10-CM

## 2018-07-22 NOTE — Telephone Encounter (Signed)
Phone call to patient to verify medication list and allergies for myelogram procedure. Pt instructed to hold Trazodone for 48hrs prior to myelogram appointment time. Pt verbalized understanding. Pre and post procedure instructions reviewed with pt. 

## 2018-08-05 ENCOUNTER — Other Ambulatory Visit: Payer: Medicare Other

## 2018-08-06 ENCOUNTER — Other Ambulatory Visit: Payer: Self-pay

## 2018-08-06 ENCOUNTER — Ambulatory Visit
Admission: RE | Admit: 2018-08-06 | Discharge: 2018-08-06 | Disposition: A | Discharge status: Home / Self Care | Payer: Medicare Other | Source: Ambulatory Visit | Attending: Orthopedic Surgery | Admitting: Orthopedic Surgery

## 2018-08-06 DIAGNOSIS — M545 Low back pain, unspecified: Secondary | ICD-10-CM

## 2018-08-06 DIAGNOSIS — M5126 Other intervertebral disc displacement, lumbar region: Secondary | ICD-10-CM | POA: Diagnosis not present

## 2018-08-06 DIAGNOSIS — G8929 Other chronic pain: Secondary | ICD-10-CM

## 2018-08-06 DIAGNOSIS — M48061 Spinal stenosis, lumbar region without neurogenic claudication: Secondary | ICD-10-CM | POA: Diagnosis not present

## 2018-08-06 MED ORDER — DIAZEPAM 5 MG PO TABS
5.0000 mg | ORAL_TABLET | Freq: Once | ORAL | Status: AC
  Administered 2018-08-06: 5 mg via ORAL

## 2018-08-06 MED ORDER — IOPAMIDOL (ISOVUE-M 200) INJECTION 41%
15.0000 mL | Freq: Once | INTRAMUSCULAR | Status: AC
  Administered 2018-08-06: 15 mL via INTRATHECAL

## 2018-08-06 NOTE — Discharge Instructions (Signed)
Myelogram Discharge Instructions  1. Go home and rest quietly for the next 24 hours.  It is important to lie flat for the next 24 hours.  Get up only to go to the restroom.  You may lie in the bed or on a couch on your back, your stomach, your left side or your right side.  You may have one pillow under your head.  You may have pillows between your knees while you are on your side or under your knees while you are on your back.  2. DO NOT drive today.  Recline the seat as far back as it will go, while still wearing your seat belt, on the way home.  3. You may get up to go to the bathroom as needed.  You may sit up for 10 minutes to eat.  You may resume your normal diet and medications unless otherwise indicated.  Drink lots of extra fluids today and tomorrow.  4. The incidence of headache, nausea, or vomiting is about 5% (one in 20 patients).  If you develop a headache, lie flat and drink plenty of fluids until the headache goes away.  Caffeinated beverages may be helpful.  If you develop severe nausea and vomiting or a headache that does not go away with flat bed rest, call [phone].  5. You may resume normal activities after your 24 hours of bed rest is over; however, do not exert yourself strongly or do any heavy lifting tomorrow. If when you get up you have a headache when standing, go back to bed and force fluids for another 24 hours.  6. Call your physician for a follow-up appointment.  The results of your myelogram will be sent directly to your physician by the following day.  7. If you have any questions or if complications develop after you arrive home, please call [phone].  Discharge instructions have been explained to the patient.  The patient, or the person responsible for the patient, fully understands these instructions.  YOU MAY RESTART YOUR TRAZODONE TOMORROW 08/07/2018 AT 08:00AM.

## 2018-08-11 DIAGNOSIS — M5416 Radiculopathy, lumbar region: Secondary | ICD-10-CM | POA: Diagnosis not present

## 2018-08-11 DIAGNOSIS — M545 Low back pain: Secondary | ICD-10-CM | POA: Diagnosis not present

## 2018-08-30 DIAGNOSIS — M545 Low back pain: Secondary | ICD-10-CM | POA: Diagnosis not present

## 2018-08-30 DIAGNOSIS — M5416 Radiculopathy, lumbar region: Secondary | ICD-10-CM | POA: Diagnosis not present

## 2018-09-14 DIAGNOSIS — M5416 Radiculopathy, lumbar region: Secondary | ICD-10-CM | POA: Diagnosis not present

## 2018-09-14 DIAGNOSIS — M545 Low back pain: Secondary | ICD-10-CM | POA: Diagnosis not present

## 2018-10-25 DIAGNOSIS — R7303 Prediabetes: Secondary | ICD-10-CM | POA: Diagnosis not present

## 2018-10-25 DIAGNOSIS — Z125 Encounter for screening for malignant neoplasm of prostate: Secondary | ICD-10-CM | POA: Diagnosis not present

## 2018-10-25 DIAGNOSIS — I10 Essential (primary) hypertension: Secondary | ICD-10-CM | POA: Diagnosis not present

## 2018-10-25 DIAGNOSIS — E782 Mixed hyperlipidemia: Secondary | ICD-10-CM | POA: Diagnosis not present

## 2018-10-25 DIAGNOSIS — G4700 Insomnia, unspecified: Secondary | ICD-10-CM | POA: Diagnosis not present

## 2018-10-25 DIAGNOSIS — M545 Low back pain: Secondary | ICD-10-CM | POA: Diagnosis not present

## 2018-10-25 DIAGNOSIS — Z0000 Encounter for general adult medical examination without abnormal findings: Secondary | ICD-10-CM | POA: Diagnosis not present

## 2018-10-25 DIAGNOSIS — F17200 Nicotine dependence, unspecified, uncomplicated: Secondary | ICD-10-CM | POA: Diagnosis not present

## 2018-10-26 DIAGNOSIS — M48061 Spinal stenosis, lumbar region without neurogenic claudication: Secondary | ICD-10-CM | POA: Diagnosis not present

## 2018-11-03 ENCOUNTER — Other Ambulatory Visit: Payer: Self-pay | Admitting: Neurological Surgery

## 2018-11-09 DIAGNOSIS — R7303 Prediabetes: Secondary | ICD-10-CM | POA: Diagnosis not present

## 2018-11-09 DIAGNOSIS — E782 Mixed hyperlipidemia: Secondary | ICD-10-CM | POA: Diagnosis not present

## 2018-11-09 DIAGNOSIS — Z23 Encounter for immunization: Secondary | ICD-10-CM | POA: Diagnosis not present

## 2018-11-09 DIAGNOSIS — I10 Essential (primary) hypertension: Secondary | ICD-10-CM | POA: Diagnosis not present

## 2018-11-09 DIAGNOSIS — Z125 Encounter for screening for malignant neoplasm of prostate: Secondary | ICD-10-CM | POA: Diagnosis not present

## 2019-01-19 NOTE — Progress Notes (Signed)
[id number] [place], [place] - [address] AT [facility] [address] [place] [address] Phone: [phone] Fax: [phone]  [facility] #[id number] [place], [place] - [address] AT [facility] OF [facility] & [address] [place] [address] Phone: [phone] Fax: [phone]     Your procedure is scheduled on Monday, 01/24/2019 Report to [facility] Main Entrance "A" at 0530 A.M., and check in at the Admitting office.   Call this number if you have problems the morning of surgery: [phone]  Call [phone] if you have any questions prior to your surgery date Monday-Friday 8am-4pm    Remember:  Do not eat or drink after midnight the night before your surgery   Take these medicines the morning of surgery with A SIP OF WATER:  Oxycodone HCl - as needed   Follow your surgeon's instructions on when to stop Aspirin.  If no instructions were given by your surgeon then you will need to call the office to get those instructions.    As of today: STOP taking any Aspirin (unless otherwise instructed by your surgeon), Aleve, Naproxen, Ibuprofen, Motrin, Advil, Goody's, BC's, all herbal medications, fish oil, and all vitamins.    The Morning of Surgery  Do not wear jewelry  Do not wear lotions, powders, colognes, or deodorant  Men may shave face and neck.  Do not bring valuables to the hospital.  [facility] is not responsible for any belongings or valuables.  If you are a smoker, DO NOT Smoke 24 hours prior to surgery  If you wear a CPAP at night please bring your mask, tubing, and machine the morning of surgery   Remember that you must have someone to transport you home after your surgery, and remain with you for 24 hours if you are discharged the same day.   Please bring cases for contacts, glasses, hearing aids, dentures or bridgework because it cannot be worn into surgery.    Leave  your suitcase in the car.  After surgery it may be brought to your room.  For patients admitted to the hospital, discharge time will be determined by your treatment team.  Patients discharged the day of surgery will not be allowed to drive home.    Special instructions:   [facility]- Preparing For Surgery  Before surgery, you can play an important role. Because skin is not sterile, your skin needs to be as free of germs as possible. You can reduce the number of germs on your skin by washing with CHG (chlorahexidine gluconate) Soap before surgery.  CHG is an antiseptic cleaner which kills germs and bonds with the skin to continue killing germs even after washing.    Oral Hygiene is also important to reduce your risk of infection.  Remember - BRUSH YOUR TEETH THE MORNING OF SURGERY WITH YOUR REGULAR TOOTHPASTE  Please do not use if you have an allergy to CHG or antibacterial soaps. If your skin becomes reddened/irritated stop using the CHG.  Do not shave (including legs and underarms) for at least 48 hours prior to first CHG shower. It is OK to shave your face.  Please follow these instructions carefully.   1. Shower the NIGHT BEFORE SURGERY and the MORNING OF SURGERY with CHG Soap.   2. If you chose to wash your hair, wash your hair first as usual with your normal shampoo.  3. After you shampoo, rinse  your hair and body thoroughly to remove the shampoo.  4. Use CHG as you would any other liquid soap. You can apply CHG directly to the skin and wash gently with a scrungie or a clean washcloth.   5. Apply the CHG Soap to your body ONLY FROM THE NECK DOWN.  Do not use on open wounds or open sores. Avoid contact with your eyes, ears, mouth and genitals (private parts). Wash Face and genitals (private parts)  with your normal soap.   6. Wash thoroughly, paying special attention to the area where your surgery will be performed.  7. Thoroughly rinse your body with warm water from the neck  down.  8. DO NOT shower/wash with your normal soap after using and rinsing off the CHG Soap.  9. Pat yourself dry with a CLEAN TOWEL.  10. Wear CLEAN PAJAMAS to bed the night before surgery, wear comfortable clothes the morning of surgery  11. Place CLEAN SHEETS on your bed the night of your first shower and DO NOT SLEEP WITH PETS.    Day of Surgery:  Please shower the morning of surgery with the CHG soap Do not apply any deodorants/lotions. Please wear clean clothes to the hospital/surgery center.   Remember to brush your teeth WITH YOUR REGULAR TOOTHPASTE.   Please read over the following fact sheets that you were given.

## 2019-01-20 ENCOUNTER — Ambulatory Visit (HOSPITAL_COMMUNITY)
Admission: RE | Admit: 2019-01-20 | Discharge: 2019-01-20 | Disposition: A | Discharge status: Home / Self Care | Payer: Medicare Other | Source: Ambulatory Visit | Attending: Neurological Surgery | Admitting: Neurological Surgery

## 2019-01-20 ENCOUNTER — Other Ambulatory Visit: Payer: Self-pay

## 2019-01-20 ENCOUNTER — Encounter (HOSPITAL_COMMUNITY): Payer: Self-pay

## 2019-01-20 ENCOUNTER — Encounter (HOSPITAL_COMMUNITY)
Admission: RE | Admit: 2019-01-20 | Discharge: 2019-01-20 | Disposition: A | Discharge status: Home / Self Care | Payer: Medicare Other | Source: Ambulatory Visit | Attending: Neurological Surgery | Admitting: Neurological Surgery

## 2019-01-20 ENCOUNTER — Other Ambulatory Visit (HOSPITAL_COMMUNITY)
Admission: RE | Admit: 2019-01-20 | Discharge: 2019-01-20 | Disposition: A | Discharge status: Home / Self Care | Payer: Medicare Other | Source: Ambulatory Visit | Attending: Neurological Surgery | Admitting: Neurological Surgery

## 2019-01-20 DIAGNOSIS — I10 Essential (primary) hypertension: Secondary | ICD-10-CM | POA: Diagnosis not present

## 2019-01-20 DIAGNOSIS — M48061 Spinal stenosis, lumbar region without neurogenic claudication: Secondary | ICD-10-CM

## 2019-01-20 DIAGNOSIS — Z20822 Contact with and (suspected) exposure to covid-19: Secondary | ICD-10-CM | POA: Insufficient documentation

## 2019-01-20 DIAGNOSIS — Z01818 Encounter for other preprocedural examination: Secondary | ICD-10-CM | POA: Insufficient documentation

## 2019-01-20 DIAGNOSIS — R001 Bradycardia, unspecified: Secondary | ICD-10-CM | POA: Insufficient documentation

## 2019-01-20 LAB — BASIC METABOLIC PANEL
Anion gap: 9 (ref 5–15)
BUN: 11 mg/dL (ref 8–23)
CO2: 23 mmol/L (ref 22–32)
Calcium: 8.9 mg/dL (ref 8.9–10.3)
Chloride: 107 mmol/L (ref 98–111)
Creatinine, Ser: 0.9 mg/dL (ref 0.61–1.24)
GFR calc Af Amer: >60 mL/min (ref >60)
GFR calc non Af Amer: >60 mL/min (ref >60)
Glucose, Bld: 124 mg/dL — ABNORMAL HIGH (ref 70–99)
Potassium: 4.1 mmol/L (ref 3.5–5.1)
Sodium: 139 mmol/L (ref 135–145)

## 2019-01-20 LAB — CBC WITH DIFFERENTIAL/PLATELET
Abs Immature Granulocytes: 0.03 10*3/uL (ref 0.00–0.07)
Basophils Absolute: 0.1 10*3/uL (ref 0.0–0.1)
Basophils Relative: 1 %
Eosinophils Absolute: 0.3 10*3/uL (ref 0.0–0.5)
Eosinophils Relative: 4 %
HCT: 44.3 % (ref 39.0–52.0)
Hemoglobin: 14.5 g/dL (ref 13.0–17.0)
Immature Granulocytes: 0 %
Lymphocytes Relative: 29 %
Lymphs Abs: 2.5 10*3/uL (ref 0.7–4.0)
MCH: 30.6 pg (ref 26.0–34.0)
MCHC: 32.7 g/dL (ref 30.0–36.0)
MCV: 93.5 fL (ref 80.0–100.0)
Monocytes Absolute: 0.9 10*3/uL (ref 0.1–1.0)
Monocytes Relative: 11 %
Neutro Abs: 4.6 10*3/uL (ref 1.7–7.7)
Neutrophils Relative %: 55 %
Platelets: 216 10*3/uL (ref 150–400)
RBC: 4.74 MIL/uL (ref 4.22–5.81)
RDW: 12.4 % (ref 11.5–15.5)
WBC: 8.4 10*3/uL (ref 4.0–10.5)
nRBC: 0 % (ref 0.0–0.2)

## 2019-01-20 LAB — TYPE AND SCREEN
ABO/RH(D): O POS
Antibody Screen: NEGATIVE

## 2019-01-20 LAB — PROTIME-INR
INR: 1 (ref 0.8–1.2)
Prothrombin Time: 13.5 seconds (ref 11.4–15.2)

## 2019-01-20 LAB — SURGICAL PCR SCREEN
MRSA, PCR: NEGATIVE
Staphylococcus aureus: POSITIVE — AB

## 2019-01-20 NOTE — Progress Notes (Signed)
PCP - Johny Blamer MD  Chest x-ray - 01/20/19 EKG - 01/20/19   COVID TEST- 01/20/19   Anesthesia review:  no  Patient denies shortness of breath, fever, cough and chest pain at PAT appointment   All instructions explained to the patient, with a verbal understanding of the material. Patient agrees to go over the instructions while at home for a better understanding. Patient also instructed to self quarantine after being tested for COVID-19. The opportunity to ask questions was provided.

## 2019-01-21 LAB — NOVEL CORONAVIRUS, NAA (HOSP ORDER, SEND-OUT TO REF LAB; TAT 18-24 HRS): SARS-CoV-2, NAA: NOT DETECTED

## 2019-01-23 NOTE — Anesthesia Preprocedure Evaluation (Addendum)
Anesthesia Evaluation  Patient identified by MRN, date of birth, ID band Patient awake    Reviewed: Allergy & Precautions, NPO status , Patient's Chart, lab work & pertinent test results  Airway Mallampati: II  TM Distance: >3 FB     Dental  (+) Dental Advisory Given   Pulmonary Current Smoker,    breath sounds clear to auscultation       Cardiovascular hypertension, Pt. on home beta blockers and Pt. on medications  Rhythm:Regular Rate:Normal     Neuro/Psych  Neuromuscular disease    GI/Hepatic negative GI ROS, Neg liver ROS,   Endo/Other  negative endocrine ROS  Renal/GU negative Renal ROS     Musculoskeletal  (+) Arthritis ,   Abdominal   Peds  Hematology negative hematology ROS (+)   Anesthesia Other Findings   Reproductive/Obstetrics                            Lab Results  Component Value Date   WBC 8.4 01/20/2019   HGB 14.5 01/20/2019   HCT 44.3 01/20/2019   MCV 93.5 01/20/2019   PLT 216 01/20/2019   Lab Results  Component Value Date   CREATININE 0.90 01/20/2019   BUN 11 01/20/2019   NA 139 01/20/2019   K 4.1 01/20/2019   CL 107 01/20/2019   CO2 23 01/20/2019   Lab Results  Component Value Date   INR 1.0 01/20/2019   INR 0.93 07/22/2010    Anesthesia Physical Anesthesia Plan  ASA: II  Anesthesia Plan: General   Post-op Pain Management:    Induction: Intravenous  PONV Risk Score and Plan: 1 and Dexamethasone, Ondansetron and Treatment may vary due to age or medical condition  Airway Management Planned: Oral ETT  Additional Equipment:   Intra-op Plan:   Post-operative Plan: Extubation in OR  Informed Consent: I have reviewed the patients History and Physical, chart, labs and discussed the procedure including the risks, benefits and alternatives for the proposed anesthesia with the patient or authorized representative who has indicated his/her understanding  and acceptance.     Dental advisory given  Plan Discussed with: CRNA  Anesthesia Plan Comments:        Anesthesia Quick Evaluation

## 2019-01-24 ENCOUNTER — Inpatient Hospital Stay (HOSPITAL_COMMUNITY): Payer: Medicare Other | Admitting: Physician Assistant

## 2019-01-24 ENCOUNTER — Other Ambulatory Visit: Payer: Self-pay

## 2019-01-24 ENCOUNTER — Inpatient Hospital Stay (HOSPITAL_COMMUNITY): Payer: Medicare Other | Admitting: Anesthesiology

## 2019-01-24 ENCOUNTER — Inpatient Hospital Stay (HOSPITAL_COMMUNITY): Payer: Medicare Other

## 2019-01-24 ENCOUNTER — Encounter (HOSPITAL_COMMUNITY): Payer: Self-pay | Admitting: Neurological Surgery

## 2019-01-24 ENCOUNTER — Inpatient Hospital Stay (HOSPITAL_COMMUNITY)
Admission: RE | Admit: 2019-01-24 | Discharge: 2019-01-26 | DRG: 455 | Disposition: A | Discharge status: Home / Self Care | Payer: Medicare Other | Attending: Neurological Surgery | Admitting: Neurological Surgery

## 2019-01-24 ENCOUNTER — Encounter (HOSPITAL_COMMUNITY)
Admission: RE | Disposition: A | Discharge status: Home / Self Care | Payer: Self-pay | Source: Home / Self Care | Attending: Neurological Surgery

## 2019-01-24 DIAGNOSIS — M48061 Spinal stenosis, lumbar region without neurogenic claudication: Principal | ICD-10-CM | POA: Diagnosis present

## 2019-01-24 DIAGNOSIS — Z419 Encounter for procedure for purposes other than remedying health state, unspecified: Secondary | ICD-10-CM

## 2019-01-24 DIAGNOSIS — F17210 Nicotine dependence, cigarettes, uncomplicated: Secondary | ICD-10-CM | POA: Diagnosis present

## 2019-01-24 DIAGNOSIS — Z79899 Other long term (current) drug therapy: Secondary | ICD-10-CM | POA: Diagnosis not present

## 2019-01-24 DIAGNOSIS — Z961 Presence of intraocular lens: Secondary | ICD-10-CM | POA: Diagnosis present

## 2019-01-24 DIAGNOSIS — Z87442 Personal history of urinary calculi: Secondary | ICD-10-CM

## 2019-01-24 DIAGNOSIS — Z981 Arthrodesis status: Secondary | ICD-10-CM | POA: Diagnosis not present

## 2019-01-24 DIAGNOSIS — Z96612 Presence of left artificial shoulder joint: Secondary | ICD-10-CM | POA: Diagnosis present

## 2019-01-24 DIAGNOSIS — I10 Essential (primary) hypertension: Secondary | ICD-10-CM | POA: Diagnosis present

## 2019-01-24 DIAGNOSIS — M19012 Primary osteoarthritis, left shoulder: Secondary | ICD-10-CM | POA: Diagnosis not present

## 2019-01-24 DIAGNOSIS — R338 Other retention of urine: Secondary | ICD-10-CM | POA: Diagnosis not present

## 2019-01-24 DIAGNOSIS — M4316 Spondylolisthesis, lumbar region: Secondary | ICD-10-CM | POA: Diagnosis not present

## 2019-01-24 DIAGNOSIS — M47816 Spondylosis without myelopathy or radiculopathy, lumbar region: Secondary | ICD-10-CM | POA: Diagnosis present

## 2019-01-24 SURGERY — POSTERIOR LUMBAR FUSION 1 WITH HARDWARE REMOVAL
Anesthesia: General | Site: Back

## 2019-01-24 MED ORDER — DEXAMETHASONE SODIUM PHOSPHATE 10 MG/ML IJ SOLN
INTRAMUSCULAR | Status: AC
  Filled 2019-01-24: qty 1

## 2019-01-24 MED ORDER — HYDROMORPHONE HCL 1 MG/ML IJ SOLN
INTRAMUSCULAR | Status: DC | PRN
  Administered 2019-01-24: .5 mg via INTRAVENOUS

## 2019-01-24 MED ORDER — SODIUM CHLORIDE 0.9% FLUSH: 3.0000 mL | INTRAVENOUS | Status: DC | PRN

## 2019-01-24 MED ORDER — HEMOSTATIC AGENTS (NO CHARGE) OPTIME
TOPICAL | Status: DC | PRN
  Administered 2019-01-24: 1 via TOPICAL

## 2019-01-24 MED ORDER — METHOCARBAMOL 500 MG PO TABS
500.0000 mg | ORAL_TABLET | Freq: Four times a day (QID) | ORAL | Status: DC | PRN
  Administered 2019-01-24 – 2019-01-26 (×4): 500 mg via ORAL
  Filled 2019-01-24 (×3): qty 1

## 2019-01-24 MED ORDER — METOPROLOL SUCCINATE ER 50 MG PO TB24
50.0000 mg | ORAL_TABLET | Freq: Every day | ORAL | Status: DC
  Administered 2019-01-24 – 2019-01-25 (×2): 50 mg via ORAL
  Filled 2019-01-24 (×3): qty 1

## 2019-01-24 MED ORDER — ONDANSETRON HCL 4 MG/2ML IJ SOLN
INTRAMUSCULAR | Status: AC
  Filled 2019-01-24: qty 2

## 2019-01-24 MED ORDER — PROPOFOL 10 MG/ML IV BOLUS
INTRAVENOUS | Status: DC | PRN
  Administered 2019-01-24 (×2): 100 mg via INTRAVENOUS

## 2019-01-24 MED ORDER — CHLORHEXIDINE GLUCONATE CLOTH 2 % EX PADS: 6.0000 | MEDICATED_PAD | Freq: Once | CUTANEOUS | Status: DC

## 2019-01-24 MED ORDER — ACETAMINOPHEN 10 MG/ML IV SOLN
INTRAVENOUS | Status: AC
  Filled 2019-01-24: qty 100

## 2019-01-24 MED ORDER — METHOCARBAMOL 1000 MG/10ML IJ SOLN
500.0000 mg | Freq: Four times a day (QID) | INTRAVENOUS | Status: DC | PRN
  Filled 2019-01-24: qty 5

## 2019-01-24 MED ORDER — DEXAMETHASONE SODIUM PHOSPHATE 10 MG/ML IJ SOLN
10.0000 mg | Freq: Once | INTRAMUSCULAR | Status: DC
  Filled 2019-01-24: qty 1

## 2019-01-24 MED ORDER — KETOROLAC TROMETHAMINE 30 MG/ML IJ SOLN
30.0000 mg | Freq: Four times a day (QID) | INTRAMUSCULAR | Status: AC
  Administered 2019-01-24 – 2019-01-25 (×4): 30 mg via INTRAVENOUS
  Filled 2019-01-24 (×4): qty 1

## 2019-01-24 MED ORDER — SODIUM CHLORIDE 0.9 % IV SOLN
250.0000 mL | INTRAVENOUS | Status: DC
  Administered 2019-01-24: 14:00:00 250 mL via INTRAVENOUS

## 2019-01-24 MED ORDER — ONDANSETRON HCL 4 MG/2ML IJ SOLN: 4.0000 mg | Freq: Once | INTRAMUSCULAR | Status: DC | PRN

## 2019-01-24 MED ORDER — POTASSIUM CHLORIDE IN NACL 20-0.9 MEQ/L-% IV SOLN
INTRAVENOUS | Status: DC
  Administered 2019-01-25: 1 mL via INTRAVENOUS
  Filled 2019-01-24 (×3): qty 1000

## 2019-01-24 MED ORDER — VANCOMYCIN HCL 1000 MG IV SOLR
INTRAVENOUS | Status: DC | PRN
  Administered 2019-01-24: 1000 mg

## 2019-01-24 MED ORDER — LIDOCAINE 2% (20 MG/ML) 5 ML SYRINGE
INTRAMUSCULAR | Status: DC | PRN
  Administered 2019-01-24: 50 mg via INTRAVENOUS

## 2019-01-24 MED ORDER — THROMBIN 20000 UNITS EX SOLR
CUTANEOUS | Status: DC | PRN
  Administered 2019-01-24: 08:00:00 20 mL via TOPICAL

## 2019-01-24 MED ORDER — THROMBIN 20000 UNITS EX SOLR
CUTANEOUS | Status: AC
  Filled 2019-01-24: qty 20000

## 2019-01-24 MED ORDER — LACTATED RINGERS IV SOLN: INTRAVENOUS | Status: DC | PRN

## 2019-01-24 MED ORDER — MENTHOL 3 MG MT LOZG: 1.0000 | LOZENGE | OROMUCOSAL | Status: DC | PRN

## 2019-01-24 MED ORDER — SODIUM CHLORIDE 0.9 % IV SOLN
INTRAVENOUS | Status: DC | PRN
  Administered 2019-01-24: 500 mL

## 2019-01-24 MED ORDER — HEPARIN SODIUM (PORCINE) 1000 UNIT/ML IJ SOLN
INTRAMUSCULAR | Status: AC
  Filled 2019-01-24: qty 1

## 2019-01-24 MED ORDER — NICOTINE 21 MG/24HR TD PT24
21.0000 mg | MEDICATED_PATCH | Freq: Every day | TRANSDERMAL | Status: AC
  Administered 2019-01-24: 20:00:00 21 mg via TRANSDERMAL
  Filled 2019-01-24: qty 1

## 2019-01-24 MED ORDER — DEXAMETHASONE SODIUM PHOSPHATE 10 MG/ML IJ SOLN
INTRAMUSCULAR | Status: DC | PRN
  Administered 2019-01-24: 10 mg via INTRAVENOUS

## 2019-01-24 MED ORDER — PHENYLEPHRINE HCL-NACL 10-0.9 MG/250ML-% IV SOLN
INTRAVENOUS | Status: DC | PRN
  Administered 2019-01-24: 20 ug/min via INTRAVENOUS

## 2019-01-24 MED ORDER — PROPOFOL 10 MG/ML IV BOLUS
INTRAVENOUS | Status: AC
  Filled 2019-01-24: qty 40

## 2019-01-24 MED ORDER — DEXAMETHASONE SODIUM PHOSPHATE 4 MG/ML IJ SOLN
4.0000 mg | Freq: Four times a day (QID) | INTRAMUSCULAR | Status: DC
  Administered 2019-01-24 – 2019-01-25 (×3): 4 mg via INTRAVENOUS
  Filled 2019-01-24 (×4): qty 1

## 2019-01-24 MED ORDER — SODIUM CHLORIDE (PF) 0.9 % IJ SOLN
INTRAMUSCULAR | Status: DC | PRN
  Administered 2019-01-24: 5 mL

## 2019-01-24 MED ORDER — MIDAZOLAM HCL 2 MG/2ML IJ SOLN
INTRAMUSCULAR | Status: AC
  Filled 2019-01-24: qty 2

## 2019-01-24 MED ORDER — FENTANYL CITRATE (PF) 100 MCG/2ML IJ SOLN: 25.0000 ug | INTRAMUSCULAR | Status: DC | PRN

## 2019-01-24 MED ORDER — ACETAMINOPHEN 650 MG RE SUPP: 650.0000 mg | RECTAL | Status: DC | PRN

## 2019-01-24 MED ORDER — FENTANYL CITRATE (PF) 250 MCG/5ML IJ SOLN
INTRAMUSCULAR | Status: AC
  Filled 2019-01-24: qty 5

## 2019-01-24 MED ORDER — PHENYLEPHRINE 40 MCG/ML (10ML) SYRINGE FOR IV PUSH (FOR BLOOD PRESSURE SUPPORT)
PREFILLED_SYRINGE | INTRAVENOUS | Status: DC | PRN
  Administered 2019-01-24 (×4): 80 ug via INTRAVENOUS

## 2019-01-24 MED ORDER — DEXMEDETOMIDINE HCL IN NACL 200 MCG/50ML IV SOLN
INTRAVENOUS | Status: DC | PRN
  Administered 2019-01-24 (×3): 8 ug via INTRAVENOUS

## 2019-01-24 MED ORDER — HYDROMORPHONE HCL 1 MG/ML IJ SOLN
INTRAMUSCULAR | Status: AC
  Filled 2019-01-24: qty 0.5

## 2019-01-24 MED ORDER — BUPIVACAINE HCL (PF) 0.25 % IJ SOLN
INTRAMUSCULAR | Status: DC | PRN
  Administered 2019-01-24: 6 mL

## 2019-01-24 MED ORDER — ONDANSETRON HCL 4 MG/2ML IJ SOLN
INTRAMUSCULAR | Status: DC | PRN
  Administered 2019-01-24: 4 mg via INTRAVENOUS

## 2019-01-24 MED ORDER — HEPARIN SODIUM (PORCINE) 1000 UNIT/ML IJ SOLN
INTRAMUSCULAR | Status: DC | PRN
  Administered 2019-01-24: 5000 [IU]

## 2019-01-24 MED ORDER — SODIUM CHLORIDE 0.9% FLUSH
3.0000 mL | Freq: Two times a day (BID) | INTRAVENOUS | Status: DC
  Administered 2019-01-24 – 2019-01-26 (×3): 3 mL via INTRAVENOUS

## 2019-01-24 MED ORDER — BUPIVACAINE HCL (PF) 0.25 % IJ SOLN
INTRAMUSCULAR | Status: AC
  Filled 2019-01-24: qty 30

## 2019-01-24 MED ORDER — PHENOL 1.4 % MT LIQD: 1.0000 | OROMUCOSAL | Status: DC | PRN

## 2019-01-24 MED ORDER — FENTANYL CITRATE (PF) 250 MCG/5ML IJ SOLN
INTRAMUSCULAR | Status: DC | PRN
  Administered 2019-01-24: 50 ug via INTRAVENOUS
  Administered 2019-01-24: 100 ug via INTRAVENOUS
  Administered 2019-01-24 (×2): 50 ug via INTRAVENOUS

## 2019-01-24 MED ORDER — ROCURONIUM BROMIDE 10 MG/ML (PF) SYRINGE
PREFILLED_SYRINGE | INTRAVENOUS | Status: DC | PRN
  Administered 2019-01-24 (×2): 50 mg via INTRAVENOUS
  Administered 2019-01-24: 20 mg via INTRAVENOUS
  Administered 2019-01-24: 30 mg via INTRAVENOUS

## 2019-01-24 MED ORDER — CEFAZOLIN SODIUM-DEXTROSE 2-4 GM/100ML-% IV SOLN
2.0000 g | Freq: Three times a day (TID) | INTRAVENOUS | Status: AC
  Administered 2019-01-24 – 2019-01-25 (×2): 2 g via INTRAVENOUS
  Filled 2019-01-24 (×3): qty 100

## 2019-01-24 MED ORDER — DEXAMETHASONE 4 MG PO TABS
4.0000 mg | ORAL_TABLET | Freq: Four times a day (QID) | ORAL | Status: DC
  Administered 2019-01-24 – 2019-01-26 (×5): 4 mg via ORAL
  Filled 2019-01-24 (×5): qty 1

## 2019-01-24 MED ORDER — HYDROMORPHONE HCL 1 MG/ML IJ SOLN
INTRAMUSCULAR | Status: AC
  Filled 2019-01-24: qty 1

## 2019-01-24 MED ORDER — METHOCARBAMOL 500 MG PO TABS
ORAL_TABLET | ORAL | Status: AC
  Filled 2019-01-24: qty 1

## 2019-01-24 MED ORDER — ONDANSETRON HCL 4 MG/2ML IJ SOLN
4.0000 mg | Freq: Four times a day (QID) | INTRAMUSCULAR | Status: DC | PRN
  Administered 2019-01-25: 01:00:00 4 mg via INTRAVENOUS
  Filled 2019-01-24: qty 2

## 2019-01-24 MED ORDER — 0.9 % SODIUM CHLORIDE (POUR BTL) OPTIME
TOPICAL | Status: DC | PRN
  Administered 2019-01-24: 1000 mL

## 2019-01-24 MED ORDER — MIDAZOLAM HCL 5 MG/5ML IJ SOLN
INTRAMUSCULAR | Status: DC | PRN
  Administered 2019-01-24 (×2): 2 mg via INTRAVENOUS

## 2019-01-24 MED ORDER — ACETAMINOPHEN 325 MG PO TABS: 650.0000 mg | ORAL_TABLET | ORAL | Status: DC | PRN

## 2019-01-24 MED ORDER — HYDROMORPHONE HCL 1 MG/ML IJ SOLN
0.2500 mg | INTRAMUSCULAR | Status: DC | PRN
  Administered 2019-01-24: 12:00:00 0.25 mg via INTRAVENOUS
  Administered 2019-01-24: 13:00:00 0.5 mg via INTRAVENOUS
  Administered 2019-01-24: 13:00:00 0.25 mg via INTRAVENOUS

## 2019-01-24 MED ORDER — SUGAMMADEX SODIUM 200 MG/2ML IV SOLN
INTRAVENOUS | Status: DC | PRN
  Administered 2019-01-24: 200 mg via INTRAVENOUS

## 2019-01-24 MED ORDER — THROMBIN 5000 UNITS EX SOLR
CUTANEOUS | Status: AC
  Filled 2019-01-24: qty 5000

## 2019-01-24 MED ORDER — THROMBIN 5000 UNITS EX SOLR
OROMUCOSAL | Status: DC | PRN
  Administered 2019-01-24: 5 mL via TOPICAL

## 2019-01-24 MED ORDER — OXYCODONE HCL 5 MG PO TABS
10.0000 mg | ORAL_TABLET | ORAL | Status: DC | PRN
  Administered 2019-01-24 – 2019-01-26 (×6): 10 mg via ORAL
  Filled 2019-01-24 (×7): qty 2

## 2019-01-24 MED ORDER — ROCURONIUM BROMIDE 10 MG/ML (PF) SYRINGE
PREFILLED_SYRINGE | INTRAVENOUS | Status: AC
  Filled 2019-01-24: qty 10

## 2019-01-24 MED ORDER — ARTHREX ANGEL - ACD-A SOLUTION (CHARTING ONLY) OPTIME
TOPICAL | Status: DC | PRN
  Administered 2019-01-24: 10 mL via TOPICAL

## 2019-01-24 MED ORDER — ONDANSETRON HCL 4 MG PO TABS: 4.0000 mg | ORAL_TABLET | Freq: Four times a day (QID) | ORAL | Status: DC | PRN

## 2019-01-24 MED ORDER — CEFAZOLIN SODIUM-DEXTROSE 2-4 GM/100ML-% IV SOLN
2.0000 g | INTRAVENOUS | Status: AC
  Administered 2019-01-24: 2 g via INTRAVENOUS
  Filled 2019-01-24: qty 100

## 2019-01-24 MED ORDER — PHENYLEPHRINE 40 MCG/ML (10ML) SYRINGE FOR IV PUSH (FOR BLOOD PRESSURE SUPPORT)
PREFILLED_SYRINGE | INTRAVENOUS | Status: AC
  Filled 2019-01-24: qty 10

## 2019-01-24 MED ORDER — LIDOCAINE 2% (20 MG/ML) 5 ML SYRINGE
INTRAMUSCULAR | Status: AC
  Filled 2019-01-24: qty 5

## 2019-01-24 MED ORDER — MORPHINE SULFATE (PF) 2 MG/ML IV SOLN
2.0000 mg | INTRAVENOUS | Status: DC | PRN
  Administered 2019-01-24 – 2019-01-25 (×4): 2 mg via INTRAVENOUS
  Filled 2019-01-24 (×5): qty 1

## 2019-01-24 MED ORDER — EPHEDRINE 5 MG/ML INJ
INTRAVENOUS | Status: AC
  Filled 2019-01-24: qty 10

## 2019-01-24 MED ORDER — ACETAMINOPHEN 10 MG/ML IV SOLN
INTRAVENOUS | Status: DC | PRN
  Administered 2019-01-24: 1000 mg via INTRAVENOUS

## 2019-01-24 MED ORDER — EPHEDRINE SULFATE-NACL 50-0.9 MG/10ML-% IV SOSY
PREFILLED_SYRINGE | INTRAVENOUS | Status: DC | PRN
  Administered 2019-01-24 (×2): 10 mg via INTRAVENOUS

## 2019-01-24 MED ORDER — ALBUMIN HUMAN 5 % IV SOLN: INTRAVENOUS | Status: DC | PRN

## 2019-01-24 MED ORDER — SENNA 8.6 MG PO TABS
1.0000 | ORAL_TABLET | Freq: Two times a day (BID) | ORAL | Status: DC
  Administered 2019-01-24 – 2019-01-26 (×4): 8.6 mg via ORAL
  Filled 2019-01-24 (×4): qty 1

## 2019-01-24 MED ORDER — VANCOMYCIN HCL 1000 MG IV SOLR
INTRAVENOUS | Status: AC
  Filled 2019-01-24: qty 1000

## 2019-01-24 MED ORDER — CELECOXIB 200 MG PO CAPS
200.0000 mg | ORAL_CAPSULE | Freq: Two times a day (BID) | ORAL | Status: DC
  Administered 2019-01-24: 200 mg via ORAL
  Filled 2019-01-24: qty 1

## 2019-01-24 SURGICAL SUPPLY — 68 items
ADH SKN CLS APL DERMABOND .7 (GAUZE/BANDAGES/DRESSINGS) ×1
APL SKNCLS STERI-STRIP NONHPOA (GAUZE/BANDAGES/DRESSINGS) ×1
BAG DECANTER FOR FLEXI CONT (MISCELLANEOUS) ×3 IMPLANT
BASKET BONE COLLECTION (BASKET) ×3 IMPLANT
BENZOIN TINCTURE PRP APPL 2/3 (GAUZE/BANDAGES/DRESSINGS) ×3 IMPLANT
BLADE CLIPPER SURG (BLADE) IMPLANT
BUR CARBIDE MATCH 3.0 (BURR) ×3 IMPLANT
CANISTER SUCT 3000ML PPV (MISCELLANEOUS) ×3 IMPLANT
CAP REVERE LOCKING (Cap) ×8 IMPLANT
CARTRIDGE OIL MAESTRO DRILL (MISCELLANEOUS) ×1 IMPLANT
CLOSURE WOUND 1/2 X4 (GAUZE/BANDAGES/DRESSINGS) ×1
CONT SPEC 4OZ CLIKSEAL STRL BL (MISCELLANEOUS) ×3 IMPLANT
CORD ACT VALLEY LAB BOVIE ASPN (MISCELLANEOUS) ×2 IMPLANT
COVER BACK TABLE 60X90IN (DRAPES) ×3 IMPLANT
COVER WAND RF STERILE (DRAPES) ×3 IMPLANT
DERMABOND ADVANCED (GAUZE/BANDAGES/DRESSINGS) ×2
DERMABOND ADVANCED .7 DNX12 (GAUZE/BANDAGES/DRESSINGS) ×1 IMPLANT
DIFFUSER DRILL AIR PNEUMATIC (MISCELLANEOUS) ×3 IMPLANT
DRAPE C-ARM 42X72 X-RAY (DRAPES) ×3 IMPLANT
DRAPE C-ARMOR (DRAPES) ×3 IMPLANT
DRAPE LAPAROTOMY 100X72X124 (DRAPES) ×3 IMPLANT
DRAPE SURG 17X23 STRL (DRAPES) ×3 IMPLANT
DRSG OPSITE POSTOP 4X6 (GAUZE/BANDAGES/DRESSINGS) ×2 IMPLANT
DURAPREP 26ML APPLICATOR (WOUND CARE) ×3 IMPLANT
ELECT BLADE 4.0 EZ CLEAN MEGAD (MISCELLANEOUS)
ELECT REM PT RETURN 9FT ADLT (ELECTROSURGICAL) ×3
ELECTRODE BLDE 4.0 EZ CLN MEGD (MISCELLANEOUS) IMPLANT
ELECTRODE REM PT RTRN 9FT ADLT (ELECTROSURGICAL) ×1 IMPLANT
EVACUATOR 1/8 PVC DRAIN (DRAIN) ×3 IMPLANT
GAUZE 4X4 16PLY RFD (DISPOSABLE) IMPLANT
GLOVE BIO SURGEON STRL SZ7 (GLOVE) ×3 IMPLANT
GLOVE BIO SURGEON STRL SZ8 (GLOVE) ×6 IMPLANT
GLOVE BIOGEL PI IND STRL 7.0 (GLOVE) IMPLANT
GLOVE BIOGEL PI INDICATOR 7.0 (GLOVE)
GOWN STRL REUS W/ TWL LRG LVL3 (GOWN DISPOSABLE) IMPLANT
GOWN STRL REUS W/ TWL XL LVL3 (GOWN DISPOSABLE) ×2 IMPLANT
GOWN STRL REUS W/TWL 2XL LVL3 (GOWN DISPOSABLE) ×2 IMPLANT
GOWN STRL REUS W/TWL LRG LVL3 (GOWN DISPOSABLE) ×3
GOWN STRL REUS W/TWL XL LVL3 (GOWN DISPOSABLE) ×6
HEMOSTAT POWDER KIT SURGIFOAM (HEMOSTASIS) ×2 IMPLANT
KIT BASIN OR (CUSTOM PROCEDURE TRAY) ×3 IMPLANT
KIT BONE MRW ASP ANGEL CPRP (KITS) ×2 IMPLANT
KIT TURNOVER KIT B (KITS) ×3 IMPLANT
MILL MEDIUM DISP (BLADE) IMPLANT
NDL HYPO 25X1 1.5 SAFETY (NEEDLE) ×1 IMPLANT
NEEDLE HYPO 25X1 1.5 SAFETY (NEEDLE) ×6 IMPLANT
NS IRRIG 1000ML POUR BTL (IV SOLUTION) ×3 IMPLANT
OIL CARTRIDGE MAESTRO DRILL (MISCELLANEOUS) ×3
PACK LAMINECTOMY NEURO (CUSTOM PROCEDURE TRAY) ×3 IMPLANT
PAD ARMBOARD 7.5X6 YLW CONV (MISCELLANEOUS) IMPLANT
PUTTY DBM ALLOSYNC PURE 10CC (Putty) ×2 IMPLANT
ROD REVERE 6.35 45MM (Rod) ×4 IMPLANT
SCREW REVERE 6.35 6.5X55MM (Screw) ×4 IMPLANT
SEALANT ADHERUS EXTEND TIP (MISCELLANEOUS) ×2 IMPLANT
SPACER SUSTAIN-RT 8X26X10 8D (Spacer) ×4 IMPLANT
SPONGE LAP 4X18 RFD (DISPOSABLE) IMPLANT
SPONGE SURGIFOAM ABS GEL 100 (HEMOSTASIS) ×3 IMPLANT
STRIP CLOSURE SKIN 1/2X4 (GAUZE/BANDAGES/DRESSINGS) ×3 IMPLANT
SUT NURALON 4 0 TR CR/8 (SUTURE) ×2 IMPLANT
SUT VIC AB 0 CT1 18XCR BRD8 (SUTURE) ×1 IMPLANT
SUT VIC AB 0 CT1 8-18 (SUTURE) ×6
SUT VIC AB 2-0 CP2 18 (SUTURE) ×5 IMPLANT
SUT VIC AB 3-0 SH 8-18 (SUTURE) ×6 IMPLANT
SYR CONTROL 10ML LL (SYRINGE) ×3 IMPLANT
TOWEL GREEN STERILE (TOWEL DISPOSABLE) ×3 IMPLANT
TOWEL GREEN STERILE FF (TOWEL DISPOSABLE) ×3 IMPLANT
TRAY FOLEY MTR SLVR 16FR STAT (SET/KITS/TRAYS/PACK) ×3 IMPLANT
WATER STERILE IRR 1000ML POUR (IV SOLUTION) ×3 IMPLANT

## 2019-01-24 NOTE — Op Note (Signed)
01/24/2019  11:57 AM  PATIENT:  Samuel Gibson  64 y.o. 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 Decompressive lumbar laminectomy hemifacetectomy and foraminotomy L3-4 requiring more work than would be required for a simple exposure of the disk for PLIF in order to adequately decompress the neural elements and address the spinal stenosis 2. Posterior lumbar interbody fusion L3-4 using globus porous titanium interbody cages packed with morcellized allograft and autograft soaked with a bone marrow aspirate obtained through a separate fascial incision 3. Posterior fixation L3-4 using globus Revere pedicle screws.  4. Intertransverse arthrodesis L3-4 using morcellized autograft and allograft. 5.  Removal of segmental fixation L4-S1  SURGEON:  Sherley Bounds, MD  ASSISTANTS: Glenford Peers, FNP  ANESTHESIA:  General  EBL: 500 ml  Total I/O In: 2250 [I.V.:2000; IV Piggyback:250] Out: 850 [Urine:350; Blood:500]  BLOOD ADMINISTERED:none  DRAINS: none   INDICATION FOR PROCEDURE: This patient presented with back and leg pain. Imaging revealed adjacent level stenosis L3-4 with previous solid fusion L4-S1. The patient tried a reasonable attempt at conservative medical measures without relief. I recommended decompression and instrumented fusion to address the stenosis as well as the segmental  instability.  Patient understood the risks, benefits, and alternatives and potential outcomes and wished to proceed.  PROCEDURE DETAILS:  The patient was brought to the operating room. After induction of generalized endotracheal anesthesia the patient was rolled into the prone position on chest rolls and all pressure points were padded. The patient's lumbar region was cleaned and then prepped with DuraPrep and draped in the usual sterile fashion. Anesthesia was injected and then a dorsal midline incision  was made and carried down to the lumbosacral fascia. The fascia was opened and the paraspinous musculature was taken down in a subperiosteal fashion to expose L3-4 as well as the previously placed instrumentation. A self-retaining retractor was placed.  We removed the locking caps from the tulip heads and removed the cross-link.  I was then able to remove the rod.  Unfortunately, when trying to remove the left S1 the driver slipped out of the locking cap and created an unintended durotomy.  Was able to repair this with fluoroscopy Nurolon sutures and a small piece of muscle.  We checked Valsalva and found no significant leakage.  I then removed the L5 and S1 pedicle screws.  The L4 pedicle screws had excellent purchase.  Intraoperative fluoroscopy confirmed my level.  I then dissected in a suprafascial plane to expose the iliac crest.  Opened the fascia and we used a Jamshidi needle to extract 60 cc of bone marrow aspirate from the iliac crest with the assistance of my nurse practitioner.  This was then spun down by [facility] device and 2 to 4 cc of  BMAC was soaked on morselized allograft for later arthrodesis.  I dried the hole with Surgifoam and closed the fascia.  I then turned my attention to the decompression and complete lumbar laminectomies, hemi- facetectomies, and foraminotomies were performed at L3-4.  My nurse practitioner was directly involved in the decompression and exposure of the neural elements. the patient had significant spinal stenosis and this required more work than would be required for a simple exposure of the disc for posterior lumbar interbody fusion which would only require a limited laminotomy. Much more generous decompression and generous foraminotomy was undertaken in order to adequately decompress the neural elements and address the patient's  leg pain. The yellow ligament was removed to expose the underlying dura and nerve roots, and generous foraminotomies were performed to adequately  decompress the neural elements. Both the exiting and traversing nerve roots were decompressed on both sides until a coronary dilator passed easily along the nerve roots. Once the decompression was complete, I turned my attention to the posterior lower lumbar interbody fusion. The epidural venous vasculature was coagulated and cut sharply. Disc space was incised and the initial discectomy was performed with pituitary rongeurs. The disc space was distracted with sequential distractors to a height of 10 mm. We then used a series of scrapers and shavers to prepare the endplates for fusion. The midline was prepared with Epstein curettes. Once the complete discectomy was finished, we packed an appropriate sized interbody cage with local autograft and morcellized allograft, gently retracted the nerve root, and tapped the cage into position at L3-4.  The midline between the cages was packed with morselized autograft and allograft. We then turned our attention to the placement of the L3 pedicle screws. The pedicle screw entry zones were identified utilizing surface landmarks and fluoroscopy. I drilled into each pedicle utilizing the hand drill, and tapped each pedicle with the appropriate tap. We palpated with a ball probe to assure no break in the cortex. We then placed 6.5 x 55 mm pedicle screws into the pedicles bilaterally at L3.  My nurse practitioner assisted in placement of the pedicle screws.  We then decorticated the transverse processes and laid a mixture of morcellized autograft and allograft out over these to perform intertransverse arthrodesis at L3-4 on the left. We then placed lordotic rods into the multiaxial screw heads of the pedicle screws and locked these in position with the locking caps and anti-torque device. We then checked our construct with AP and lateral fluoroscopy. Irrigated with copious amounts of bacitracin-containing saline solution. Inspected the nerve roots once again to assure adequate  decompression, lined to the dura with Gelfoam, placed powdered vancomycin into the wound, and inspected our durotomy closure once again.  We lined this with Tisseel fibrin glue and Gelfoam.  Then we closed the muscle and the fascia with 0 Vicryl. Closed the subcutaneous tissues with 2-0 Vicryl and subcuticular tissues with 3-0 Vicryl. The skin was closed with benzoin and Steri-Strips. Dressing was then applied, the patient was awakened from general anesthesia and transported to the recovery room in stable condition. At the end of the procedure all sponge, needle and instrument counts were correct.   PLAN OF CARE: admit to inpatient  PATIENT DISPOSITION:  PACU - hemodynamically stable.   Delay start of Pharmacological VTE agent (>24hrs) due to surgical blood loss or risk of bleeding:  yes

## 2019-01-24 NOTE — Anesthesia Procedure Notes (Signed)
Procedure Name: Intubation Date/Time: 01/24/2019 7:46 AM Performed by: Adria Dill, CRNA Pre-anesthesia Checklist: Patient identified, Emergency Drugs available, Suction available and Patient being monitored Patient Re-evaluated:Patient Re-evaluated prior to induction Oxygen Delivery Method: Circle system utilized Preoxygenation: Pre-oxygenation with 100% oxygen Induction Type: IV induction Ventilation: Mask ventilation without difficulty Laryngoscope Size: Miller and 3 Grade View: Grade I Tube type: Oral Tube size: 7.5 mm Number of attempts: 1 Airway Equipment and Method: Stylet and Oral airway Placement Confirmation: ETT inserted through vocal cords under direct vision,  positive ETCO2 and breath sounds checked- equal and bilateral Secured at: 21 cm Tube secured with: Tape Dental Injury: Teeth and Oropharynx as per pre-operative assessment

## 2019-01-24 NOTE — Anesthesia Postprocedure Evaluation (Signed)
Anesthesia Post Note  Patient: Samuel Gibson  Procedure(s) Performed: Posterior Lumbar Interbody Fusion - Lumbar three3-Lumbar four, removal of hardware Lumbar four-to  sacral one (N/A Back)     Patient location during evaluation: PACU Anesthesia Type: General Level of consciousness: awake and alert Pain management: pain level controlled Vital Signs Assessment: post-procedure vital signs reviewed and stable Respiratory status: spontaneous breathing, nonlabored ventilation, respiratory function stable and patient connected to nasal cannula oxygen Cardiovascular status: blood pressure returned to baseline and stable Postop Assessment: no apparent nausea or vomiting Anesthetic complications: no    Last Vitals:  Vitals:   01/24/19 1315 01/24/19 1337  BP:  114/78  Pulse: 72 70  Resp: 14 16  Temp:  36.7 C  SpO2: 97% 95%    Last Pain:  Vitals:   01/24/19 1337  TempSrc: Axillary  PainSc:                  Kennieth Rad

## 2019-01-24 NOTE — Progress Notes (Addendum)
Patient arrived to 3W10. Alert & oriented x4. Stating "It hurts all over" when asked about pain. POC provided to patient. Nurse will continue to monitor.

## 2019-01-24 NOTE — H&P (Signed)
Subjective: Patient is a 64 y.o. male admitted for back pain. Onset of symptoms was several months ago, gradually worsening since that time.  The pain is rated severe, and is located at the across the lower back and radiates to legs at times. The pain is described as aching and occurs all day. The symptoms have been progressive. Symptoms are exacerbated by exercise. MRI or CT showed adjacent level stenosis   Past Medical History:  Diagnosis Date  . Anxiety   . Arthritis   . Degenerative arthritis of lumbar spine   . Hernia of pelvic floor   . History of kidney stones   . Hypertension     Past Surgical History:  Procedure Laterality Date  . BACK SURGERY    . EYE SURGERY     intraocular lens implants in 12/12/2014 per pt on 01/20/19  . SHOULDER SURGERY    . 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 TOTAL SHOULDER ARTHROPLASTY Left 01/29/2016   Procedure: TOTAL SHOULDER ARTHROPLASTY;  Surgeon: Sheral Apley, MD;  Location: [facility] OR;  Service: Orthopedics;  Laterality: Left;    Prior to Admission medications   Medication Sig Start Date End Date Taking? Authorizing Provider  ibuprofen (ADVIL) 800 MG tablet Take 800 mg by mouth every 8 (eight) hours as needed for moderate pain.   Yes [provider]  metoprolol succinate (TOPROL-XL) 50 MG 24 hr tablet Take 50 mg by mouth at bedtime. Take with or immediately following a meal.    Yes [provider]  Oxycodone HCl 20 MG TABS Take 20 mg by mouth every 6 (six) hours as needed (pain).    Yes [provider]  rosuvastatin (CRESTOR) 10 MG tablet Take 10 mg by mouth at bedtime. 12/20/18  Yes [provider]  aspirin EC 81 MG tablet Take 1 tablet (81 mg total) by mouth daily. For 30 days for dvt prophylaxis Patient not taking: Reported on 01/13/2019 01/29/16   Albina Billet III, PA-C  doxycycline (VIBRAMYCIN) 100 MG capsule Take 1 capsule (100 mg total) by mouth 2 (two) times daily. Patient not taking:  Reported on 07/22/2018 03/24/18   Harlene Salts A, PA-C  ondansetron (ZOFRAN ODT) 4 MG disintegrating tablet Take 1 tablet (4 mg total) by mouth every 8 (eight) hours as needed for nausea or vomiting. Patient not taking: Reported on 07/22/2018 03/24/18   Harlene Salts A, PA-C  ondansetron (ZOFRAN) 4 MG tablet Take 1 tablet (4 mg total) by mouth every 8 (eight) hours as needed for nausea or vomiting. Patient not taking: Reported on 07/22/2018 03/16/18   Mesner, Barbara Cower, MD  oseltamivir (TAMIFLU) 75 MG capsule Take 1 capsule (75 mg total) by mouth every 12 (twelve) hours. Patient not taking: Reported on 01/13/2019 03/16/18   Mesner, Barbara Cower, MD   No Known Allergies  Social History   Tobacco Use  . Smoking status: Current Every Day Smoker    Packs/day: 0.50    Years: 34.00    Pack years: 17.00    Types: Cigarettes  . Smokeless tobacco: Former Neurosurgeon    Types: Chew  Substance Use Topics  . Alcohol use: No    History reviewed. No pertinent family history.   Review of Systems  Positive ROS: neg  All other systems have been reviewed and were otherwise negative with the exception of those mentioned in the HPI and as above.  Objective: Vital signs in last 24 hours: Temp:  [98.1 F (36.7 C)] 98.1 F (36.7 C) (01/18 7253)  Pulse Rate:  [70] 70 (01/18 0628) Resp:  [18] 18 (01/18 0628) BP: (131)/(81) 131/81 (01/18 0628) SpO2:  [98 %] 98 % (01/18 0628) Weight:  [90.7 kg] 90.7 kg (01/18 0628)  General Appearance: Alert, cooperative, no distress, appears stated age Head: Normocephalic, without obvious abnormality, atraumatic Eyes: PERRL, conjunctiva/corneas clear, EOM's intact    Neck: Supple, symmetrical, trachea midline Back: Symmetric, no curvature, ROM normal, no CVA tenderness Lungs:  respirations unlabored Heart: Regular rate and rhythm Abdomen: Soft, non-tender Extremities: Extremities normal, atraumatic, no cyanosis or edema Pulses: 2+ and symmetric all extremities Skin: Skin color,  texture, turgor normal, no rashes or lesions  NEUROLOGIC:   Mental status: Alert and oriented x4,  no aphasia, good attention span, fund of knowledge, and memory Motor Exam - grossly normal Sensory Exam - grossly normal Reflexes: 1+ Coordination - grossly normal Gait - grossly normal Balance - grossly normal Cranial Nerves: I: smell Not tested  II: visual acuity  OS: nl    OD: nl  II: visual fields Full to confrontation  II: pupils Equal, round, reactive to light  III,VII: ptosis None  III,IV,VI: extraocular muscles  Full ROM  V: mastication Normal  V: facial light touch sensation  Normal  V,VII: corneal reflex  Present  VII: facial muscle function - upper  Normal  VII: facial muscle function - lower Normal  VIII: hearing Not tested  IX: soft palate elevation  Normal  IX,X: gag reflex Present  XI: trapezius strength  5/5  XI: sternocleidomastoid strength 5/5  XI: neck flexion strength  5/5  XII: tongue strength  Normal    Data Review Lab Results  Component Value Date   WBC 8.4 01/20/2019   HGB 14.5 01/20/2019   HCT 44.3 01/20/2019   MCV 93.5 01/20/2019   PLT 216 01/20/2019   Lab Results  Component Value Date   NA 139 01/20/2019   K 4.1 01/20/2019   CL 107 01/20/2019   CO2 23 01/20/2019   BUN 11 01/20/2019   CREATININE 0.90 01/20/2019   GLUCOSE 124 (H) 01/20/2019   Lab Results  Component Value Date   INR 1.0 01/20/2019    Assessment/Plan:  Estimated body mass index is 28.7 kg/m as calculated from the following:   Height as of this encounter: 5\' 10"  (1.778 m).   Weight as of this encounter: 90.7 kg. Patient admitted for PLIF L3-4. Patient has failed a reasonable attempt at conservative therapy.  I explained the condition and procedure to the patient and answered any questions.  Patient wishes to proceed with procedure as planned. Understands risks/ benefits and typical outcomes of procedure.   Eustace Moore 01/24/2019 7:25 AM

## 2019-01-24 NOTE — Transfer of Care (Signed)
Immediate Anesthesia Transfer of Care Note  Patient: Samuel Gibson  Procedure(s) Performed: Posterior Lumbar Interbody Fusion - Lumbar three3-Lumbar four, removal of hardware Lumbar four-to  sacral one (N/A Back)  Patient Location: PACU  Anesthesia Type:General  Level of Consciousness: awake and pateint uncooperative  Airway & Oxygen Therapy: Patient Spontanous Breathing and Patient connected to face mask oxygen  Post-op Assessment: Report given to RN and Post -op Vital signs reviewed and stable  Post vital signs: Reviewed and stable  Last Vitals:  Vitals Value Taken Time  BP 130/86 01/24/19 1203  Temp    Pulse 85 01/24/19 1209  Resp 13 01/24/19 1209  SpO2 100 % 01/24/19 1209  Vitals shown include unvalidated device data.  Last Pain:  Vitals:   01/24/19 1203  TempSrc:   PainSc: (P) Asleep         Complications: No apparent anesthesia complications

## 2019-01-25 MED ORDER — NICOTINE 21 MG/24HR TD PT24
21.0000 mg | MEDICATED_PATCH | Freq: Every day | TRANSDERMAL | Status: DC
  Administered 2019-01-25: 21 mg via TRANSDERMAL
  Filled 2019-01-25: qty 1

## 2019-01-25 NOTE — Progress Notes (Signed)
Subjective: Patient reports moderate back pain but legs feel good  Objective: Vital signs in last 24 hours: Temp:  [98 F (36.7 C)-98.5 F (36.9 C)] 98 F (36.7 C) (01/19 0346) Pulse Rate:  [70-99] 88 (01/19 0346) Resp:  [10-16] 16 (01/19 0346) BP: (99-160)/(64-97) 143/78 (01/19 0346) SpO2:  [95 %-100 %] 100 % (01/18 2329)  Intake/Output from previous day: 01/18 0701 - 01/19 0700 In: 3450 [I.V.:3000; IV Piggyback:450] Out: 1150 [Urine:650; Blood:500] Intake/Output this shift: No intake/output data recorded.  Neurologic: Grossly normal  Lab Results: Lab Results  Component Value Date   WBC 8.4 01/20/2019   HGB 14.5 01/20/2019   HCT 44.3 01/20/2019   MCV 93.5 01/20/2019   PLT 216 01/20/2019   Lab Results  Component Value Date   INR 1.0 01/20/2019   BMET Lab Results  Component Value Date   NA 139 01/20/2019   K 4.1 01/20/2019   CL 107 01/20/2019   CO2 23 01/20/2019   GLUCOSE 124 (H) 01/20/2019   BUN 11 01/20/2019   CREATININE 0.90 01/20/2019   CALCIUM 8.9 01/20/2019    Studies/Results: DG Lumbar Spine 2-3 Views  Result Date: 01/24/2019 CLINICAL DATA:  PLIF 3-4. Removal hardware L4-S1. Posterior lumbar interbody fusion L3-L4, removal of hardware L4-S1. RS O. Fluoro time 40 seconds. EXAM: LUMBAR SPINE - 2-3 VIEW; DG C-ARM 1-60 MIN COMPARISON:  Lumbar spine CT 08/06/2018 FINDINGS: Two intraoperative fluoroscopic images are submitted. An interbody spacer and posterior spinal fusion construct are now present at the L3-L4 level. Previously demonstrated posterior spinal fusion construct at the L4-S1 levels no longer seen. Interbody spacers persist at these levels. IMPRESSION: Two intraoperative fluoroscopic images of the lumbar spine as detailed. Electronically Signed   By: Jackey Loge DO   On: 01/24/2019 11:23   DG C-Arm 1-60 Min  Result Date: 01/24/2019 CLINICAL DATA:  PLIF 3-4. Removal hardware L4-S1. Posterior lumbar interbody fusion L3-L4, removal of hardware L4-S1.  RS O. Fluoro time 40 seconds. EXAM: LUMBAR SPINE - 2-3 VIEW; DG C-ARM 1-60 MIN COMPARISON:  Lumbar spine CT 08/06/2018 FINDINGS: Two intraoperative fluoroscopic images are submitted. An interbody spacer and posterior spinal fusion construct are now present at the L3-L4 level. Previously demonstrated posterior spinal fusion construct at the L4-S1 levels no longer seen. Interbody spacers persist at these levels. IMPRESSION: Two intraoperative fluoroscopic images of the lumbar spine as detailed. Electronically Signed   By: Jackey Loge DO   On: 01/24/2019 11:23    Assessment/Plan: Ambulate today. Therapies, continue pain control. DC foley catheter   LOS: 1 day    Tiana Loft [facility] 01/25/2019, 7:47 AM

## 2019-01-25 NOTE — Progress Notes (Signed)
Patient has been educated by RN and NT about hitting call bell when needing assistance. Pt being noncompliant with safety measures and going to bathroom without assistance. Bed alarm currently on. Nurse will continue to educate.

## 2019-01-25 NOTE — Evaluation (Signed)
Physical Therapy Evaluation Patient Details Name: Samuel Gibson MRN: [id number] DOB: 08/28/1955 Today's Date: 01/25/2019   History of Present Illness  Patient is a 64 y/o male admitted for L3-4 decompressive laminectomy and PLIF.  History of prior back surgery, DDD, hernia, L total shoulder arthroplasty.  Clinical Impression  Patient presents with mobility at mod I level.  He reported understanding of mobility restrictions from prior surgery and demonstrated mobility safe for home with intermittent support.  Educated on activity progression and need for rest between bouts of activity.  Feel he will progress at home without PT follow up.  PT to sign off.      Follow Up Recommendations No PT follow up    Equipment Recommendations  None recommended by PT    Recommendations for Other Services       Precautions / Restrictions Precautions Precautions: Back;Fall Precaution Comments: mildly impulsive      Mobility  Bed Mobility Overal bed mobility: Modified Independent             General bed mobility comments: demonstrated proper technique after discussing  Transfers Overall transfer level: Modified independent Equipment used: None                Ambulation/Gait Ambulation/Gait assistance: Modified independent (Device/Increase time) Gait Distance (Feet): 160 Feet Assistive device: None Gait Pattern/deviations: Step-through pattern;Step-to pattern;Wide base of support;Decreased stride length     General Gait Details: decent pace, no LOB without device, discussed ambulation as main form of exercise and to balance with rest  Stairs Stairs: Yes Stairs assistance: Supervision Stair Management: Alternating pattern;Forwards;One rail Right Number of Stairs: 4    Wheelchair Mobility    Modified Rankin (Stroke Patients Only)       Balance Overall balance assessment: No apparent balance deficits (not formally assessed)                                            Pertinent Vitals/Pain Pain Assessment: 0-10 Pain Score: 5  Pain Location: incisional Pain Descriptors / Indicators: Discomfort;Operative site guarding Pain Intervention(s): Monitored during session;Repositioned    Home Living Family/patient expects to be discharged to:: Private residence Living Arrangements: Other relatives(two cousins staying with him right now) Available Help at Discharge: Family Type of Home: House Home Access: Stairs to enter Entrance Stairs-Rails: Psychiatric nurse of Steps: 3 Home Layout: One level Home Equipment: Civil engineer, contracting - built in;Hand held Veterinary surgeon - single point      Prior Function Level of Independence: Independent         Comments: still working, 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, newspaper        Extremity/Trunk Assessment   Upper Extremity Assessment Upper Extremity Assessment: Overall WFL for tasks assessed    Lower Extremity Assessment Lower Extremity Assessment: Overall WFL for tasks assessed       Communication   Communication: No difficulties  Cognition Arousal/Alertness: Awake/alert Behavior During Therapy: WFL for tasks assessed/performed;Impulsive Overall Cognitive Status: Within Functional Limits for tasks assessed                                 General Comments: not willing to wait for help, up in room going to bathroom, but no LOB, managing IV      General Comments  General comments (skin integrity, edema, etc.): Discussed car transfer, issued handout with precautions and educated on same    Exercises     Assessment/Plan    PT Assessment Patent does not need any further PT services  PT Problem List         PT Treatment Interventions      PT Goals (Current goals can be found in the Care Plan section)  Acute Rehab PT Goals PT Goal Formulation: All assessment and education complete, DC therapy    Frequency     Barriers to discharge         Co-evaluation               AM-PAC PT "6 Clicks" Mobility  Outcome Measure Help needed turning from your back to your side while in a flat bed without using bedrails?: None Help needed moving from lying on your back to sitting on the side of a flat bed without using bedrails?: None Help needed moving to and from a bed to a chair (including a wheelchair)?: None Help needed standing up from a chair using your arms (e.g., wheelchair or bedside chair)?: None Help needed to walk in hospital room?: None Help needed climbing 3-5 steps with a railing? : None 6 Click Score: 24    End of Session   Activity Tolerance: Patient tolerated treatment well Patient left: with call bell/phone within reach;in bed(seated EOB)   PT Visit Diagnosis: Difficulty in walking, not elsewhere classified (R26.2)    Time: 2706-2376 PT Time Calculation (min) (ACUTE ONLY): 25 min   Charges:   PT Evaluation $PT Eval Low Complexity: 1 Low PT Treatments $Gait Training: 8-22 mins        Samuel Gibson, PT Acute Rehabilitation Services [phone] 01/25/2019   Samuel Gibson 01/25/2019, 1:52 PM

## 2019-01-26 MED ORDER — CHLORHEXIDINE GLUCONATE CLOTH 2 % EX PADS
6.0000 | MEDICATED_PAD | Freq: Every day | CUTANEOUS | Status: DC
  Administered 2019-01-26: 6 via TOPICAL

## 2019-01-26 MED ORDER — TAMSULOSIN HCL 0.4 MG PO CAPS
0.4000 mg | ORAL_CAPSULE | Freq: Every day | ORAL | Status: DC
  Administered 2019-01-26: 09:00:00 0.4 mg via ORAL
  Filled 2019-01-26: qty 1

## 2019-01-26 MED ORDER — TAMSULOSIN HCL 0.4 MG PO CAPS: 0.4000 mg | ORAL_CAPSULE | Freq: Every day | ORAL | 0 refills | Status: AC

## 2019-01-26 MED ORDER — METHOCARBAMOL 500 MG PO TABS: 500.0000 mg | ORAL_TABLET | Freq: Four times a day (QID) | ORAL | 1 refills | Status: AC | PRN

## 2019-01-26 NOTE — Discharge Summary (Signed)
Physician Discharge Summary  Patient ID: Samuel Gibson MRN: [id number] DOB/AGE: December 04, 1955 64 y.o.  Admit date: 01/24/2019 Discharge date: 01/26/2019  Admission Diagnoses: adjacent level stenosis    Discharge Diagnoses: same, urinary retention post-op   Discharged Condition: good  Hospital Course: The patient was admitted on 01/24/2019 and taken to the operating room where the patient underwent PLIF L3-4. The patient tolerated the procedure well and was taken to the recovery room and then to the floor in stable condition. The hospital course was routine. There were no complications other than acute post-op urinary retention requiring foley insertion. The wound remained clean dry and intact. Pt had appropriate back soreness. No complaints of leg pain or new N/T/W. The patient remained afebrile with stable vital signs, and tolerated a regular diet. The patient continued to increase activities, and pain was well controlled with oral pain medications. He prefers discharge with leg bag rather than continued hospitalization. He has a urologist, Dr Pete Glatter. We will arange follow up for catheter management at his request.  Consults: None  Significant Diagnostic Studies:  Results for orders placed or performed during the hospital encounter of 01/20/19  Novel Coronavirus, NAA (Hosp order, Send-out to Ref Lab; TAT 18-24 hrs   Specimen: Nasopharyngeal Swab; Respiratory  Result Value Ref Range   SARS-CoV-2, NAA NOT DETECTED NOT DETECTED   Coronavirus Source NASOPHARYNGEAL     Chest 2 View  Result Date: 01/20/2019 CLINICAL DATA:  Preoperative, hypertension EXAM: CHEST - 2 VIEW COMPARISON:  03/24/2018 FINDINGS: The heart size and mediastinal contours are within normal limits. Both lungs are clear. The visualized skeletal structures are unremarkable. IMPRESSION: No acute abnormality of the lungs. Electronically Signed   By: Lauralyn Primes M.D.   On: 01/20/2019 09:51   DG Lumbar Spine 2-3 Views  Result  Date: 01/24/2019 CLINICAL DATA:  PLIF 3-4. Removal hardware L4-S1. Posterior lumbar interbody fusion L3-L4, removal of hardware L4-S1. RS O. Fluoro time 40 seconds. EXAM: LUMBAR SPINE - 2-3 VIEW; DG C-ARM 1-60 MIN COMPARISON:  Lumbar spine CT 08/06/2018 FINDINGS: Two intraoperative fluoroscopic images are submitted. An interbody spacer and posterior spinal fusion construct are now present at the L3-L4 level. Previously demonstrated posterior spinal fusion construct at the L4-S1 levels no longer seen. Interbody spacers persist at these levels. IMPRESSION: Two intraoperative fluoroscopic images of the lumbar spine as detailed. Electronically Signed   By: Jackey Loge DO   On: 01/24/2019 11:23   DG C-Arm 1-60 Min  Result Date: 01/24/2019 CLINICAL DATA:  PLIF 3-4. Removal hardware L4-S1. Posterior lumbar interbody fusion L3-L4, removal of hardware L4-S1. RS O. Fluoro time 40 seconds. EXAM: LUMBAR SPINE - 2-3 VIEW; DG C-ARM 1-60 MIN COMPARISON:  Lumbar spine CT 08/06/2018 FINDINGS: Two intraoperative fluoroscopic images are submitted. An interbody spacer and posterior spinal fusion construct are now present at the L3-L4 level. Previously demonstrated posterior spinal fusion construct at the L4-S1 levels no longer seen. Interbody spacers persist at these levels. IMPRESSION: Two intraoperative fluoroscopic images of the lumbar spine as detailed. Electronically Signed   By: Jackey Loge DO   On: 01/24/2019 11:23    Antibiotics:  Anti-infectives (From admission, onward)   Start     Dose/Rate Route Frequency Ordered Stop   01/24/19 1600  ceFAZolin (ANCEF) IVPB 2g/100 mL premix     2 g 200 mL/hr over 30 Minutes Intravenous Every 8 hours 01/24/19 1340 01/25/19 0129   01/24/19 1113  vancomycin (VANCOCIN) powder  Status:  Discontinued  As needed 01/24/19 1114 01/24/19 1155   01/24/19 0720  bacitracin 50,000 Units in sodium chloride 0.9 % 500 mL irrigation  Status:  Discontinued       As needed 01/24/19 0849  01/24/19 1155   01/24/19 0600  ceFAZolin (ANCEF) IVPB 2g/100 mL premix     2 g 200 mL/hr over 30 Minutes Intravenous On call to O.R. 01/24/19 0550 01/24/19 0836      Discharge Exam: Blood pressure 139/74, pulse 81, temperature (!) 97.5 F (36.4 C), temperature source Oral, resp. rate 18, height 5\' 10"  (1.778 m), weight 90.7 kg, SpO2 99 %. Neurologic: Grossly normal Dressing CDI Walking well, great strength,noraml sensation throughout  Discharge Medications:   Allergies as of 01/26/2019   No Known Allergies     Medication List    STOP taking these medications   doxycycline 100 MG capsule Commonly known as: VIBRAMYCIN   oseltamivir 75 MG capsule Commonly known as: TAMIFLU     TAKE these medications   aspirin EC 81 MG tablet Take 1 tablet (81 mg total) by mouth daily. For 30 days for dvt prophylaxis   ibuprofen 800 MG tablet Commonly known as: ADVIL Take 800 mg by mouth every 8 (eight) hours as needed for moderate pain.   methocarbamol 500 MG tablet Commonly known as: ROBAXIN Take 1 tablet (500 mg total) by mouth every 6 (six) hours as needed for muscle spasms.   metoprolol succinate 50 MG 24 hr tablet Commonly known as: TOPROL-XL Take 50 mg by mouth at bedtime. Take with or immediately following a meal.   ondansetron 4 MG disintegrating tablet Commonly known as: Zofran ODT Take 1 tablet (4 mg total) by mouth every 8 (eight) hours as needed for nausea or vomiting.   ondansetron 4 MG tablet Commonly known as: ZOFRAN Take 1 tablet (4 mg total) by mouth every 8 (eight) hours as needed for nausea or vomiting.   Oxycodone HCl 20 MG Tabs Take 20 mg by mouth every 6 (six) hours as needed (pain).   rosuvastatin 10 MG tablet Commonly known as: CRESTOR Take 10 mg by mouth at bedtime.   tamsulosin 0.4 MG Caps capsule Commonly known as: FLOMAX Take 1 capsule (0.4 mg total) by mouth daily.            Durable Medical Equipment  (From admission, onward)          Start     Ordered   01/24/19 1341  DME Walker rolling  Once    Question:  Patient needs a walker to treat with the following condition  Answer:  S/P lumbar fusion   01/24/19 1340   01/24/19 1341  DME 3 n 1  Once     01/24/19 1340          Disposition: home   Final Dx: PLIF L3-4  Discharge Instructions    Call MD for:  difficulty breathing, headache or visual disturbances   Complete by: As directed    Call MD for:  persistant nausea and vomiting   Complete by: As directed    Call MD for:  redness, tenderness, or signs of infection (pain, swelling, redness, odor or green/yellow discharge around incision site)   Complete by: As directed    Call MD for:  severe uncontrolled pain   Complete by: As directed    Call MD for:  temperature >100.4   Complete by: As directed    Diet - low sodium heart healthy   Complete by: As directed  Discharge instructions   Complete by: As directed    follow up with Dr Felipa Eth for catheter management   Increase activity slowly   Complete by: As directed    Remove dressing in 48 hours   Complete by: As directed       Follow-up Information    Eustace Moore, MD. Schedule an appointment as soon as possible for a visit in 2 week(s).   Specialty: Neurosurgery Contact information: [address] [place] [address] [phone]            Signed: Eustace Moore 01/26/2019, 8:35 AM

## 2019-01-26 NOTE — Progress Notes (Addendum)
Occupational Therapy Evaluation Patient Details Name: Samuel Gibson MRN: [id number] DOB: 06/23/1955 Today's Date: 01/26/2019    History of Present Illness Patient is a 64 y/o male admitted for L3-4 decompressive laminectomy and PLIF.  History of prior back surgery, DDD, hernia, L total shoulder arthroplasty.   Clinical Impression   PTA, pt was living at home alone, he was independent and working as a Teacher, adult education. Upon arrival, pt able to recall 2/3 precautions. He demonstrated good stability with functional mobility without AD, but demonstrates mild impulsivity requiring supervision for ADL completion for adherence to back precautions. Educated pt on proper management of catheter with emptying and dressing, pt verbalized and demonstrated understanding. Anticipate pt will continue to progress toward baseline with return home with assist from cousins who live nearby. Patient evaluated by Occupational Therapy with no further acute OT needs identified. All education has been completed and the patient has no further questions. See below for any follow-up Occupational Therapy or equipment needs. OT to sign off. Thank you for referral.      Follow Up Recommendations  No OT follow up    Equipment Recommendations  None recommended by OT    Recommendations for Other Services       Precautions / Restrictions Precautions Precautions: Back;Fall Precaution Booklet Issued: No(verbally reviewed, pt recall 2/3 precautions ) Precaution Comments: mildly impulsive Restrictions Weight Bearing Restrictions: No      Mobility Bed Mobility Overal bed mobility: Modified Independent             General bed mobility comments: demonstrated proper technique after discussing   Transfers Overall transfer level: Needs assistance Equipment used: None Transfers: Sit to/from Stand Sit to Stand: Supervision         General transfer comment: supervision for adherence to back precautions    Balance  Overall balance assessment: No apparent balance deficits (not formally assessed)                                         ADL either performed or assessed with clinical judgement   ADL Overall ADL's : Needs assistance/impaired                                     Functional mobility during ADLs: Supervision/safety General ADL Comments: overall Supervision for adherence to back precautions during ADL, pt mildly impulsive and required vc for adherence during LB dressing;supervision for functional mobiltiy for adherence to precautions     Vision Patient Visual Report: No change from baseline       Perception     Praxis      Pertinent Vitals/Pain Pain Assessment: No/denies pain Pain Intervention(s): Monitored during session     Hand Dominance Right   Extremity/Trunk Assessment Upper Extremity Assessment Upper Extremity Assessment: Overall WFL for tasks assessed   Lower Extremity Assessment Lower Extremity Assessment: Overall WFL for tasks assessed   Cervical / Trunk Assessment Cervical / Trunk Assessment: Normal   Communication Communication Communication: No difficulties   Cognition Arousal/Alertness: Awake/alert Behavior During Therapy: WFL for tasks assessed/performed;Impulsive Overall Cognitive Status: Within Functional Limits for tasks assessed                                 General Comments: mildly impulsive  requires min vc for adherence to back precautions during LB dressing;pt able to recall 2/3 precautions   General Comments  discussed importance of mobiltiy, taking rest breaks, and limiting duration of sitting    Exercises     Shoulder Instructions      Home Living Family/patient expects to be discharged to:: Private residence Living Arrangements: Other relatives(two cousins staying with him right now) Available Help at Discharge: Family Type of Home: House Home Access: Stairs to enter Engineer, site of Steps: 3 Entrance Stairs-Rails: Right;Left Home Layout: One level     Bathroom Shower/Tub: Tub/shower unit(walk in tub)   Bathroom Toilet: Standard     Home Equipment: Shower seat - built in;Hand held Veterinary surgeon - single point          Prior Functioning/Environment Level of Independence: Independent        Comments: still working, 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        OT Problem List: Impaired balance (sitting and/or standing);Decreased knowledge of precautions      OT Treatment/Interventions:      OT Goals(Current goals can be found in the care plan section) Acute Rehab OT Goals Patient Stated Goal: to go home OT Goal Formulation: With patient Time For Goal Achievement: 02/09/19 Potential to Achieve Goals: Good  OT Frequency:     Barriers to D/C:            Co-evaluation              AM-PAC OT "6 Clicks" Daily Activity     Outcome Measure Help from another person eating meals?: None Help from another person taking care of personal grooming?: None Help from another person toileting, which includes using toliet, bedpan, or urinal?: None Help from another person bathing (including washing, rinsing, drying)?: None Help from another person to put on and taking off regular upper body clothing?: None Help from another person to put on and taking off regular lower body clothing?: None 6 Click Score: 24   End of Session Nurse Communication: Mobility status  Activity Tolerance: Patient tolerated treatment well Patient left: in chair;with call bell/phone within reach  OT Visit Diagnosis: Other abnormalities of gait and mobility (R26.89)                Time: 8416-6063 OT Time Calculation (min): 20 min Charges:  OT General Charges $OT Visit: 1 Visit OT Evaluation $OT Eval Low Complexity: [place] OTR/L Acute Rehabilitation Services Office: [phone]   Wyn Forster 01/26/2019, 9:31 AM

## 2019-01-26 NOTE — Progress Notes (Signed)
Patient discharged home with sister and family. Discharge information given. Prescriptions called to patient's pharmacy. IV and telemetry discontinued. Patient questions asked and answered. Patient transported from unit via wheelchair with volunteer services. Lawson Radar

## 2019-01-28 ENCOUNTER — Encounter (HOSPITAL_COMMUNITY): Payer: Self-pay | Admitting: Emergency Medicine

## 2019-01-28 ENCOUNTER — Ambulatory Visit: Payer: Self-pay | Admitting: *Deleted

## 2019-01-28 ENCOUNTER — Emergency Department (HOSPITAL_COMMUNITY)
Admission: EM | Admit: 2019-01-28 | Discharge: 2019-01-28 | Disposition: A | Discharge status: Home / Self Care | Payer: Medicare Other | Attending: Emergency Medicine | Admitting: Emergency Medicine

## 2019-01-28 ENCOUNTER — Other Ambulatory Visit: Payer: Self-pay

## 2019-01-28 DIAGNOSIS — R102 Pelvic and perineal pain: Secondary | ICD-10-CM | POA: Diagnosis not present

## 2019-01-28 DIAGNOSIS — R338 Other retention of urine: Secondary | ICD-10-CM | POA: Diagnosis not present

## 2019-01-28 DIAGNOSIS — Z79899 Other long term (current) drug therapy: Secondary | ICD-10-CM | POA: Diagnosis not present

## 2019-01-28 DIAGNOSIS — K5900 Constipation, unspecified: Secondary | ICD-10-CM | POA: Insufficient documentation

## 2019-01-28 DIAGNOSIS — I10 Essential (primary) hypertension: Secondary | ICD-10-CM | POA: Diagnosis not present

## 2019-01-28 DIAGNOSIS — F17210 Nicotine dependence, cigarettes, uncomplicated: Secondary | ICD-10-CM | POA: Diagnosis not present

## 2019-01-28 DIAGNOSIS — Z96612 Presence of left artificial shoulder joint: Secondary | ICD-10-CM | POA: Diagnosis not present

## 2019-01-28 DIAGNOSIS — R339 Retention of urine, unspecified: Secondary | ICD-10-CM | POA: Diagnosis not present

## 2019-01-28 DIAGNOSIS — N9989 Other postprocedural complications and disorders of genitourinary system: Secondary | ICD-10-CM | POA: Diagnosis not present

## 2019-01-28 LAB — URINALYSIS, ROUTINE W REFLEX MICROSCOPIC
Bilirubin Urine: NEGATIVE
Glucose, UA: NEGATIVE mg/dL
Hgb urine dipstick: NEGATIVE
Ketones, ur: NEGATIVE mg/dL
Leukocytes,Ua: NEGATIVE
Nitrite: NEGATIVE
Protein, ur: NEGATIVE mg/dL
Specific Gravity, Urine: 1.017 (ref 1.005–1.030)
pH: 6 (ref 5.0–8.0)

## 2019-01-28 LAB — CBC WITH DIFFERENTIAL/PLATELET
Abs Immature Granulocytes: 0.06 10*3/uL (ref 0.00–0.07)
Basophils Absolute: 0 10*3/uL (ref 0.0–0.1)
Basophils Relative: 0 %
Eosinophils Absolute: 0.3 10*3/uL (ref 0.0–0.5)
Eosinophils Relative: 3 %
HCT: 34.7 % — ABNORMAL LOW (ref 39.0–52.0)
Hemoglobin: 11.4 g/dL — ABNORMAL LOW (ref 13.0–17.0)
Immature Granulocytes: 1 %
Lymphocytes Relative: 16 %
Lymphs Abs: 2.1 10*3/uL (ref 0.7–4.0)
MCH: 30.4 pg (ref 26.0–34.0)
MCHC: 32.9 g/dL (ref 30.0–36.0)
MCV: 92.5 fL (ref 80.0–100.0)
Monocytes Absolute: 1.2 10*3/uL — ABNORMAL HIGH (ref 0.1–1.0)
Monocytes Relative: 9 %
Neutro Abs: 9.2 10*3/uL — ABNORMAL HIGH (ref 1.7–7.7)
Neutrophils Relative %: 71 %
Platelets: 234 10*3/uL (ref 150–400)
RBC: 3.75 MIL/uL — ABNORMAL LOW (ref 4.22–5.81)
RDW: 12.6 % (ref 11.5–15.5)
WBC: 12.9 10*3/uL — ABNORMAL HIGH (ref 4.0–10.5)
nRBC: 0 % (ref 0.0–0.2)

## 2019-01-28 LAB — BASIC METABOLIC PANEL
Anion gap: 8 (ref 5–15)
BUN: 15 mg/dL (ref 8–23)
CO2: 25 mmol/L (ref 22–32)
Calcium: 8.8 mg/dL — ABNORMAL LOW (ref 8.9–10.3)
Chloride: 99 mmol/L (ref 98–111)
Creatinine, Ser: 1.05 mg/dL (ref 0.61–1.24)
GFR calc Af Amer: >60 mL/min (ref >60)
GFR calc non Af Amer: >60 mL/min (ref >60)
Glucose, Bld: 128 mg/dL — ABNORMAL HIGH (ref 70–99)
Potassium: 4.2 mmol/L (ref 3.5–5.1)
Sodium: 132 mmol/L — ABNORMAL LOW (ref 135–145)

## 2019-01-28 MED ORDER — POLYETHYLENE GLYCOL 3350 17 G PO PACK: 17.0000 g | PACK | Freq: Every day | ORAL | 0 refills | Status: AC

## 2019-01-28 MED ORDER — FLEET ENEMA 7-19 GM/118ML RE ENEM
1.0000 | ENEMA | Freq: Once | RECTAL | Status: DC
  Filled 2019-01-28: qty 1

## 2019-01-28 NOTE — Discharge Instructions (Addendum)
Keep foley in place and follow up with your urologist   Take miralax daily for constipation   See your doctor   Return to ER if your foley is not draining, worse constipation, vomiting, fever

## 2019-01-28 NOTE — ED Provider Notes (Signed)
[facility] EMERGENCY DEPARTMENT Provider Note   CSN: [id number] Arrival date & time: 01/28/19  1909     History Chief Complaint  Patient presents with  . Urinary Retention    REESE STOCKMAN is a 64 y.o. male hx of HTN, back arthritis s/p decompressive lumbar laminectomy on 1/18, here with urinary retention. After his surgery, he developed urinary retention. Foley was placed in the hospital. He went to urology today and had foley removed. States that he is unable to urinate afterwards. Has severe pelvic pain. Also had no bowel movement since the surgery.   The history is provided by the patient.       Past Medical History:  Diagnosis Date  . Anxiety   . Arthritis   . Degenerative arthritis of lumbar spine   . Hernia of pelvic floor   . History of kidney stones   . Hypertension     Patient Active Problem List   Diagnosis Date Noted  . S/P lumbar fusion 01/24/2019  . Back pain 01/14/2016  . History of lumbar surgery 01/14/2016  . Essential hypertension 01/14/2016  . Hx of renal calculi 01/14/2016  . Chronic pain 01/14/2016  . Primary osteoarthritis, left shoulder 01/14/2016    Past Surgical History:  Procedure Laterality Date  . BACK SURGERY    . EYE SURGERY     intraocular lens implants in 12/12/2014 per pt on 01/20/19  . SHOULDER SURGERY    . 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 TOTAL SHOULDER ARTHROPLASTY Left 01/29/2016   Procedure: TOTAL SHOULDER ARTHROPLASTY;  Surgeon: Sheral Apley, MD;  Location: [facility] OR;  Service: Orthopedics;  Laterality: Left;       No family history on file.  Social History   Tobacco Use  . Smoking status: Current Every Day Smoker    Packs/day: 0.50    Years: 34.00    Pack years: 17.00    Types: Cigarettes  . Smokeless tobacco: Former Neurosurgeon    Types: Chew  Substance Use Topics  . Alcohol use: No  . Drug use: No    Home Medications Prior to Admission medications   Medication Sig Start Date End  Date Taking? Authorizing Provider  aspirin EC 81 MG tablet Take 1 tablet (81 mg total) by mouth daily. For 30 days for dvt prophylaxis Patient not taking: Reported on 01/13/2019 01/29/16   Albina Billet III, PA-C  ibuprofen (ADVIL) 800 MG tablet Take 800 mg by mouth every 8 (eight) hours as needed for moderate pain.    [provider]  methocarbamol (ROBAXIN) 500 MG tablet Take 1 tablet (500 mg total) by mouth every 6 (six) hours as needed for muscle spasms. 01/26/19   Tia Alert, MD  metoprolol succinate (TOPROL-XL) 50 MG 24 hr tablet Take 50 mg by mouth at bedtime. Take with or immediately following a meal.     [provider]  ondansetron (ZOFRAN ODT) 4 MG disintegrating tablet Take 1 tablet (4 mg total) by mouth every 8 (eight) hours as needed for nausea or vomiting. Patient not taking: Reported on 07/22/2018 03/24/18   Harlene Salts A, PA-C  ondansetron (ZOFRAN) 4 MG tablet Take 1 tablet (4 mg total) by mouth every 8 (eight) hours as needed for nausea or vomiting. Patient not taking: Reported on 07/22/2018 03/16/18   Mesner, Barbara Cower, MD  Oxycodone HCl 20 MG TABS Take 20 mg by mouth every 6 (six) hours as needed (pain).     [provider]  rosuvastatin (CRESTOR) 10 MG tablet Take 10 mg by mouth at bedtime. 12/20/18   [provider]  tamsulosin (FLOMAX) 0.4 MG CAPS capsule Take 1 capsule (0.4 mg total) by mouth daily. 01/26/19   Eustace Moore, MD    Allergies    Patient has no known allergies.  Review of Systems   Review of Systems  Genitourinary: Positive for difficulty urinating.  All other systems reviewed and are negative.   Physical Exam Updated Vital Signs BP (!) 145/74 (BP Location: Right Arm)   Pulse 91   Temp 98 F (36.7 C) (Oral)   Resp (!) 22   SpO2 100%   Physical Exam Vitals and nursing note reviewed.  Constitutional:      Comments: Uncomfortable   HENT:     Head: Normocephalic.     Nose: Nose normal.      Mouth/Throat:     Mouth: Mucous membranes are moist.  Eyes:     Extraocular Movements: Extraocular movements intact.     Pupils: Pupils are equal, round, and reactive to light.  Cardiovascular:     Rate and Rhythm: Normal rate and regular rhythm.     Pulses: Normal pulses.     Heart sounds: Normal heart sounds.  Pulmonary:     Effort: Pulmonary effort is normal.     Breath sounds: Normal breath sounds.  Abdominal:     General: Abdomen is flat.     Comments: + suprapubic tenderness   Musculoskeletal:        General: Normal range of motion.     Cervical back: Normal range of motion.     Comments: Laminectomy site healing, there is ecchymosis R flank area with no active bleeding   Skin:    General: Skin is warm.     Capillary Refill: Capillary refill takes less than 2 seconds.  Neurological:     General: No focal deficit present.     Mental Status: He is oriented to person, place, and time.  Psychiatric:        Mood and Affect: Mood normal.        Behavior: Behavior normal.     ED Results / Procedures / Treatments   Labs (all labs ordered are listed, but only abnormal results are displayed) Labs Reviewed  CBC WITH DIFFERENTIAL/PLATELET - Abnormal; Notable for the following components:      Result Value   WBC 12.9 (*)    RBC 3.75 (*)    Hemoglobin 11.4 (*)    HCT 34.7 (*)    Neutro Abs 9.2 (*)    Monocytes Absolute 1.2 (*)    All other components within normal limits  BASIC METABOLIC PANEL - Abnormal; Notable for the following components:   Sodium 132 (*)    Glucose, Bld 128 (*)    Calcium 8.8 (*)    All other components within normal limits  URINE CULTURE  URINALYSIS, ROUTINE W REFLEX MICROSCOPIC    EKG None  Radiology No results found.  Procedures Procedures (including critical care time)  Medications Ordered in ED Medications  sodium phosphate (FLEET) 7-19 GM/118ML enema 1 enema (1 enema Rectal Refused 01/28/19 2131)    ED Course  I have reviewed the  triage vital signs and the nursing notes.  Pertinent labs & imaging results that were available during my care of the patient were reviewed by me and considered in my medical decision making (see chart for details).    MDM Rules/Calculators/A&P  ROBERTH BERLING is a 64 y.o. male here with trouble urinating. Had urinary retention postop. His bladder scan showed 700 cc in his bladder. Will put foley and get chemistry.   9:36 PM Labs and UA unremarkable. About 500 cc foley came out. Stable for discharge. Offered enema for constipation but he refused. Will dc home with miralax   Final Clinical Impression(s) / ED Diagnoses Final diagnoses:  None    Rx / DC Orders ED Discharge Orders    None       Charlynne Pander, MD 01/28/19 2137

## 2019-01-28 NOTE — Telephone Encounter (Signed)
Pt reports released from hospital Wednesday, went to urologist this am 0930 and had foley removed. Pt has back surgery. States he has not voided since, bladder distention and severe discomfort. Advised UC/ED. Pt states he will go to ED.  Reason for Disposition . SEVERE abdominal pain    Foley D/Ced this AM, has not voided.  Answer Assessment - Initial Assessment Questions 1. SYMPTOMS: "What symptoms are you concerned about?"     Foley removed this AM 0930, pt has not voided yet 2. ONSET:  "When did the symptoms start?"      3. FEVER: "Is there a fever?" If so, ask: "What is the temperature, how was it measured, and when did it start?"     4. ABDOMINAL PAIN: "Is there any abdominal pain?" (e.g., Scale 1-10; or mild, moderate, severe)     severe 5. URINE COLOR: "What color is the urine?"  "Is there blood present in the urine?" (e.g., clear, yellow, cloudy, tea-colored, blood streaks, bright red)      6. ONSET: "When was the catheter inserted?"      7. OTHER SYMPTOMS: "Do you have any other symptoms?" (e.g., back pain, bad urine odor)     Distention.  Protocols used: URINARY CATHETER SYMPTOMS AND QUESTIONS-A-AH

## 2019-01-28 NOTE — ED Triage Notes (Signed)
Patient reports urinary retention/bladder pressure , urinary catheter removed this morning at [facility] , S/P lumbar fusion 01/24/19 . Denies fever or chills .

## 2019-01-30 LAB — URINE CULTURE: Culture: NO GROWTH

## 2019-02-05 ENCOUNTER — Other Ambulatory Visit: Payer: Self-pay

## 2019-02-05 ENCOUNTER — Emergency Department (HOSPITAL_COMMUNITY)
Admission: EM | Admit: 2019-02-05 | Discharge: 2019-02-05 | Discharge status: Absent without leave | Payer: Medicare Other

## 2019-02-09 DIAGNOSIS — N9989 Other postprocedural complications and disorders of genitourinary system: Secondary | ICD-10-CM | POA: Diagnosis not present

## 2019-02-09 DIAGNOSIS — R339 Retention of urine, unspecified: Secondary | ICD-10-CM | POA: Diagnosis not present

## 2019-02-09 DIAGNOSIS — R338 Other retention of urine: Secondary | ICD-10-CM | POA: Diagnosis not present

## 2019-02-11 ENCOUNTER — Other Ambulatory Visit: Payer: Self-pay

## 2019-02-11 ENCOUNTER — Emergency Department (HOSPITAL_COMMUNITY)
Admission: EM | Admit: 2019-02-11 | Discharge: 2019-02-11 | Disposition: A | Discharge status: Home / Self Care | Payer: Medicare Other | Attending: Emergency Medicine | Admitting: Emergency Medicine

## 2019-02-11 DIAGNOSIS — Z79899 Other long term (current) drug therapy: Secondary | ICD-10-CM | POA: Diagnosis not present

## 2019-02-11 DIAGNOSIS — I10 Essential (primary) hypertension: Secondary | ICD-10-CM | POA: Insufficient documentation

## 2019-02-11 DIAGNOSIS — F17210 Nicotine dependence, cigarettes, uncomplicated: Secondary | ICD-10-CM | POA: Insufficient documentation

## 2019-02-11 DIAGNOSIS — Z7982 Long term (current) use of aspirin: Secondary | ICD-10-CM | POA: Diagnosis not present

## 2019-02-11 DIAGNOSIS — R339 Retention of urine, unspecified: Secondary | ICD-10-CM | POA: Diagnosis not present

## 2019-02-11 LAB — URINALYSIS, ROUTINE W REFLEX MICROSCOPIC
Bilirubin Urine: NEGATIVE
Glucose, UA: NEGATIVE mg/dL
Ketones, ur: NEGATIVE mg/dL
Leukocytes,Ua: NEGATIVE
Nitrite: NEGATIVE
Protein, ur: 100 mg/dL — AB
RBC / HPF: >50 RBC/hpf — ABNORMAL HIGH (ref 0–5)
Specific Gravity, Urine: 1.014 (ref 1.005–1.030)
pH: 6 (ref 5.0–8.0)

## 2019-02-11 MED ORDER — LIDOCAINE HCL URETHRAL/MUCOSAL 2 % EX GEL
1.0000 "application " | Freq: Once | CUTANEOUS | Status: DC | PRN
  Filled 2019-02-11: qty 5

## 2019-02-11 NOTE — ED Provider Notes (Signed)
[facility] DEPT Provider Note   CSN: [id number] Arrival date & time: 02/11/19  1128     History Chief Complaint  Patient presents with  . Urinary Retention    Samuel Gibson is a 64 y.o. male.  64 year old male presents with urinary retention x24 hours.  Patient had back surgery recently and denies any prior history of prostate issues.  He was instructed to self cath himself should he develop any trouble urinating.  He attempted this at home without success.  Denies any fever or flank pain.  No emesis.        Past Medical History:  Diagnosis Date  . Anxiety   . Arthritis   . Degenerative arthritis of lumbar spine   . Hernia of pelvic floor   . History of kidney stones   . Hypertension     Patient Active Problem List   Diagnosis Date Noted  . S/P lumbar fusion 01/24/2019  . Back pain 01/14/2016  . History of lumbar surgery 01/14/2016  . Essential hypertension 01/14/2016  . Hx of renal calculi 01/14/2016  . Chronic pain 01/14/2016  . Primary osteoarthritis, left shoulder 01/14/2016    Past Surgical History:  Procedure Laterality Date  . BACK SURGERY    . EYE SURGERY     intraocular lens implants in 12/12/2014 per pt on 01/20/19  . SHOULDER SURGERY    . 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 TOTAL SHOULDER ARTHROPLASTY Left 01/29/2016   Procedure: TOTAL SHOULDER ARTHROPLASTY;  Surgeon: Renette Butters, MD;  Location: [facility];  Service: Orthopedics;  Laterality: Left;       No family history on file.  Social History   Tobacco Use  . Smoking status: Current Every Day Smoker    Packs/day: 0.50    Years: 34.00    Pack years: 17.00    Types: Cigarettes  . Smokeless tobacco: Former Systems developer    Types: Chew  Substance Use Topics  . Alcohol use: No  . Drug use: No    Home Medications Prior to Admission medications   Medication Sig Start Date End Date Taking? Authorizing Provider  aspirin EC 81 MG tablet Take 1 tablet (81 mg  total) by mouth daily. For 30 days for dvt prophylaxis Patient not taking: Reported on 01/13/2019 01/29/16   Prudencio Burly III, PA-C  ibuprofen (ADVIL) 800 MG tablet Take 800 mg by mouth every 8 (eight) hours as needed for moderate pain.    [provider]  methocarbamol (ROBAXIN) 500 MG tablet Take 1 tablet (500 mg total) by mouth every 6 (six) hours as needed for muscle spasms. 01/26/19   Eustace Moore, MD  metoprolol succinate (TOPROL-XL) 50 MG 24 hr tablet Take 50 mg by mouth at bedtime. Take with or immediately following a meal.     [provider]  ondansetron (ZOFRAN ODT) 4 MG disintegrating tablet Take 1 tablet (4 mg total) by mouth every 8 (eight) hours as needed for nausea or vomiting. Patient not taking: Reported on 07/22/2018 03/24/18   Nuala Alpha A, PA-C  ondansetron (ZOFRAN) 4 MG tablet Take 1 tablet (4 mg total) by mouth every 8 (eight) hours as needed for nausea or vomiting. Patient not taking: Reported on 07/22/2018 03/16/18   Mesner, Corene Cornea, MD  Oxycodone HCl 20 MG TABS Take 20 mg by mouth every 6 (six) hours as needed (pain).     [provider]  polyethylene glycol (MIRALAX / GLYCOLAX) 17 g packet Take 17  g by mouth daily. 01/28/19   Charlynne Pander, MD  rosuvastatin (CRESTOR) 10 MG tablet Take 10 mg by mouth at bedtime. 12/20/18   [provider]  tamsulosin (FLOMAX) 0.4 MG CAPS capsule Take 1 capsule (0.4 mg total) by mouth daily. 01/26/19   Tia Alert, MD    Allergies    Patient has no known allergies.  Review of Systems   Review of Systems  All other systems reviewed and are negative.   Physical Exam Updated Vital Signs BP 139/78   Pulse 78   Temp 98 F (36.7 C) (Oral)   Resp 18   Ht 1.778 m (5\' 10" )   Wt 90.7 kg   SpO2 99%   BMI 28.70 kg/m   Physical Exam Vitals and nursing note reviewed.  Constitutional:      General: He is not in acute distress.    Appearance: Normal appearance. He is well-developed.  He is not toxic-appearing.  HENT:     Head: Normocephalic and atraumatic.  Eyes:     General: Lids are normal.     Conjunctiva/sclera: Conjunctivae normal.     Pupils: Pupils are equal, round, and reactive to light.  Neck:     Thyroid: No thyroid mass.     Trachea: No tracheal deviation.  Cardiovascular:     Rate and Rhythm: Normal rate and regular rhythm.     Heart sounds: Normal heart sounds. No murmur. No gallop.   Pulmonary:     Effort: Pulmonary effort is normal. No respiratory distress.     Breath sounds: Normal breath sounds. No stridor. No decreased breath sounds, wheezing, rhonchi or rales.  Abdominal:     General: Bowel sounds are normal. There is no distension.     Palpations: Abdomen is soft.     Tenderness: There is no abdominal tenderness. There is no rebound.  Musculoskeletal:        General: No tenderness. Normal range of motion.     Cervical back: Normal range of motion and neck supple.  Skin:    General: Skin is warm and dry.     Findings: No abrasion or rash.  Neurological:     Mental Status: He is alert and oriented to person, place, and time.     GCS: GCS eye subscore is 4. GCS verbal subscore is 5. GCS motor subscore is 6.     Cranial Nerves: No cranial nerve deficit.     Sensory: No sensory deficit.  Psychiatric:        Speech: Speech normal.        Behavior: Behavior normal.     ED Results / Procedures / Treatments   Labs (all labs ordered are listed, but only abnormal results are displayed) Labs Reviewed  URINALYSIS, ROUTINE W REFLEX MICROSCOPIC    EKG None  Radiology No results found.  Procedures Procedures (including critical care time)  Medications Ordered in ED Medications  lidocaine (XYLOCAINE) 2 % jelly 1 application (has no administration in time range)    ED Course  I have reviewed the triage vital signs and the nursing notes.  Pertinent labs & imaging results that were available during my care of the patient were reviewed  by me and considered in my medical decision making (see chart for details).    MDM Rules/Calculators/A&P                      Foley placed by nursing with good drainage.  Patient  to follow-up with his urologist.  Urinalysis negative for infection Final Clinical Impression(s) / ED Diagnoses Final diagnoses:  None    Rx / DC Orders ED Discharge Orders    None       Lorre Nick, MD 02/11/19 1350

## 2019-02-11 NOTE — ED Triage Notes (Signed)
Per patient, he had back surgery on 01/24/19. Patient had indwelling catheter until Wednesday 02/09/19. Had cath removed that day, was able to void once before leaving pcp. Patient states yesterday he could not void, he did self cath and only blood clots came out. Patient reports same findings today. Pain is rated 10/10. Urologist is Dr Arvin Collard.

## 2019-02-21 DIAGNOSIS — R339 Retention of urine, unspecified: Secondary | ICD-10-CM | POA: Diagnosis not present

## 2019-02-21 DIAGNOSIS — N138 Other obstructive and reflux uropathy: Secondary | ICD-10-CM | POA: Diagnosis not present

## 2019-02-21 DIAGNOSIS — N401 Enlarged prostate with lower urinary tract symptoms: Secondary | ICD-10-CM | POA: Diagnosis not present

## 2019-02-23 ENCOUNTER — Other Ambulatory Visit: Payer: Self-pay

## 2019-02-23 ENCOUNTER — Emergency Department (HOSPITAL_COMMUNITY)
Admission: EM | Admit: 2019-02-23 | Discharge: 2019-02-23 | Disposition: A | Discharge status: Home / Self Care | Payer: Medicare Other | Attending: Emergency Medicine | Admitting: Emergency Medicine

## 2019-02-23 ENCOUNTER — Encounter (HOSPITAL_COMMUNITY): Payer: Self-pay | Admitting: *Deleted

## 2019-02-23 DIAGNOSIS — Z96612 Presence of left artificial shoulder joint: Secondary | ICD-10-CM | POA: Insufficient documentation

## 2019-02-23 DIAGNOSIS — I10 Essential (primary) hypertension: Secondary | ICD-10-CM | POA: Diagnosis not present

## 2019-02-23 DIAGNOSIS — F17210 Nicotine dependence, cigarettes, uncomplicated: Secondary | ICD-10-CM | POA: Insufficient documentation

## 2019-02-23 DIAGNOSIS — Z79899 Other long term (current) drug therapy: Secondary | ICD-10-CM | POA: Insufficient documentation

## 2019-02-23 DIAGNOSIS — N401 Enlarged prostate with lower urinary tract symptoms: Secondary | ICD-10-CM | POA: Insufficient documentation

## 2019-02-23 DIAGNOSIS — R339 Retention of urine, unspecified: Secondary | ICD-10-CM | POA: Insufficient documentation

## 2019-02-23 HISTORY — DX: Benign prostatic hyperplasia without lower urinary tract symptoms: N40.0

## 2019-02-23 NOTE — ED Notes (Signed)
PT provided in and out cath for home. Pt express appt with urology later this mooring.  Pt express having sterile gloved at home for procedure. Pt A/O x4 and ambulatory at discharge.

## 2019-02-23 NOTE — ED Triage Notes (Signed)
Pt had a back surgery recently and was discharged with a urinary catheter. On Monday his catheter was discontinued and pt did have self catheters after that. He no longer has in catheters and states that he is "sure he is full".

## 2019-02-23 NOTE — ED Notes (Signed)
In and out cath performed by Nurse Kyra Searles with this RN assistance. of urine output. Pt express relief form pain in lower abdomen.

## 2019-02-23 NOTE — ED Provider Notes (Signed)
[facility] DEPT Provider Note  CSN: [id number] Arrival date & time: 02/23/19 0123  Chief Complaint(s) Urinary Retention  HPI Samuel Gibson is a 64 y.o. male with a history of BPH and degenerative disc disease status post lumbar fusion 1 month ago who is self cathing presents to the emergency department with bladder distention and urinary retention.  Patient reports that he last self cath around 6:30p.  States that he ran out of his catheters.  He is noticing suprapubic distention and discomfort.  No alleviating or aggravating factors.  Denies any other physical complaints.  Patient reports that he gets another batch of catheters tomorrow.  HPI  Past Medical History Past Medical History:  Diagnosis Date  . Anxiety   . Arthritis   . BPH (benign prostatic hyperplasia)   . Degenerative arthritis of lumbar spine   . Hernia of pelvic floor   . History of kidney stones   . Hypertension    Patient Active Problem List   Diagnosis Date Noted  . S/P lumbar fusion 01/24/2019  . Back pain 01/14/2016  . History of lumbar surgery 01/14/2016  . Essential hypertension 01/14/2016  . Hx of renal calculi 01/14/2016  . Chronic pain 01/14/2016  . Primary osteoarthritis, left shoulder 01/14/2016   Home Medication(s) Prior to Admission medications   Medication Sig Start Date End Date Taking? Authorizing Provider  aspirin EC 81 MG tablet Take 1 tablet (81 mg total) by mouth daily. For 30 days for dvt prophylaxis Patient not taking: Reported on 01/13/2019 01/29/16   Prudencio Burly III, PA-C  ibuprofen (ADVIL) 800 MG tablet Take 800 mg by mouth every 8 (eight) hours as needed for moderate pain.    [provider]  methocarbamol (ROBAXIN) 500 MG tablet Take 1 tablet (500 mg total) by mouth every 6 (six) hours as needed for muscle spasms. 01/26/19   Eustace Moore, MD  metoprolol succinate (TOPROL-XL) 50 MG 24 hr tablet Take 50 mg by mouth at bedtime. Take  with or immediately following a meal.     [provider]  ondansetron (ZOFRAN ODT) 4 MG disintegrating tablet Take 1 tablet (4 mg total) by mouth every 8 (eight) hours as needed for nausea or vomiting. Patient not taking: Reported on 07/22/2018 03/24/18   Nuala Alpha A, PA-C  ondansetron (ZOFRAN) 4 MG tablet Take 1 tablet (4 mg total) by mouth every 8 (eight) hours as needed for nausea or vomiting. Patient not taking: Reported on 07/22/2018 03/16/18   Mesner, Corene Cornea, MD  Oxycodone HCl 20 MG TABS Take 20 mg by mouth every 6 (six) hours as needed (pain).     [provider]  polyethylene glycol (MIRALAX / GLYCOLAX) 17 g packet Take 17 g by mouth daily. 01/28/19   Drenda Freeze, MD  rosuvastatin (CRESTOR) 10 MG tablet Take 10 mg by mouth at bedtime. 12/20/18   [provider]  tamsulosin (FLOMAX) 0.4 MG CAPS capsule Take 1 capsule (0.4 mg total) by mouth daily. 01/26/19   Eustace Moore, MD  Past Surgical History Past Surgical History:  Procedure Laterality Date  . BACK SURGERY    . EYE SURGERY     intraocular lens implants in 12/12/2014 per pt on 01/20/19  . SHOULDER SURGERY    . 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 TOTAL SHOULDER ARTHROPLASTY Left 01/29/2016   Procedure: TOTAL SHOULDER ARTHROPLASTY;  Surgeon: Renette Butters, MD;  Location: [facility];  Service: Orthopedics;  Laterality: Left;   Family History History reviewed. No pertinent family history.  Social History Social History   Tobacco Use  . Smoking status: Current Every Day Smoker    Packs/day: 0.50    Years: 34.00    Pack years: 17.00    Types: Cigarettes  . Smokeless tobacco: Former Systems developer    Types: Chew  Substance Use Topics  . Alcohol use: No  . Drug use: No   Allergies Patient has no known allergies.  Review of Systems Review of Systems All other  systems are reviewed and are negative for acute change except as noted in the HPI  Physical Exam Vital Signs  I have reviewed the triage vital signs BP (!) 151/83 (BP Location: Left Arm)   Pulse 82   Temp 98.5 F (36.9 C) (Oral)   Resp (!) 24   Ht '5\' 10"'$  (1.778 m)   Wt 88.5 kg   SpO2 97%   BMI 27.98 kg/m   Physical Exam Vitals reviewed.  Constitutional:      General: He is not in acute distress.    Appearance: He is well-developed. He is not diaphoretic.  HENT:     Head: Normocephalic and atraumatic.     Jaw: No trismus.     Right Ear: External ear normal.     Left Ear: External ear normal.     Nose: Nose normal.  Eyes:     General: No scleral icterus.    Conjunctiva/sclera: Conjunctivae normal.  Neck:     Trachea: Phonation normal.  Cardiovascular:     Rate and Rhythm: Normal rate and regular rhythm.  Pulmonary:     Effort: Pulmonary effort is normal. No respiratory distress.     Breath sounds: No stridor.  Abdominal:     General: There is no distension.  Musculoskeletal:        General: Normal range of motion.     Cervical back: Normal range of motion.  Neurological:     Mental Status: He is alert and oriented to person, place, and time.  Psychiatric:        Behavior: Behavior normal.     ED Results and Treatments Labs (all labs ordered are listed, but only abnormal results are displayed) Labs Reviewed - No data to display                                                                                                                       EKG  EKG Interpretation  Date/Time:    Ventricular Rate:    PR Interval:  QRS Duration:   QT Interval:    QTC Calculation:   R Axis:     Text Interpretation:        Radiology No results found.  Pertinent labs & imaging results that were available during my care of the patient were reviewed by me and considered in my medical decision making (see chart for details).  Medications Ordered in ED Medications -  No data to display                                                                                                                                  Procedures Procedures  (including critical care time)  Medical Decision Making / ED Course I have reviewed the nursing notes for this encounter and the patient's prior records (if available in EHR or on provided paperwork).   Samuel Gibson was evaluated in Emergency Department on 02/23/2019 for the symptoms described in the history of present illness. He was evaluated in the context of the global COVID-19 pandemic, which necessitated consideration that the patient might be at risk for infection with the SARS-CoV-2 virus that causes COVID-19. Institutional protocols and algorithms that pertain to the evaluation of patients at risk for COVID-19 are in a state of rapid change based on information released by regulatory bodies including the CDC and federal and state organizations. These policies and algorithms were followed during the patient's care in the ED.  Bladder scan noted greater than 600 cc in the bladder. In and out cath retrieved approximately 1000 cc of urine. Patient symptoms resolved. Provided with a self cath kit to go home with for tonight.  No need for diagnostics or any additional intervention at this time.      Final Clinical Impression(s) / ED Diagnoses Final diagnoses:  Urinary retention   The patient appears reasonably screened and/or stabilized for discharge and I doubt any other medical condition or other [facility] requiring further screening, evaluation, or treatment in the ED at this time prior to discharge. Safe for discharge with strict return precautions.  Disposition: Discharge  Condition: Good  I have discussed the results, Dx and Tx plan with the patient/family who expressed understanding and agree(s) with the plan. Discharge instructions discussed at length. The patient/family was given strict return precautions who  verbalized understanding of the instructions. No further questions at time of discharge.    ED Discharge Orders    None       Follow Up: Shirline Frees, MD [place] [address] [phone]  Schedule an appointment as soon as possible for a visit  As needed      This chart was dictated using voice recognition software.  Despite best efforts to proofread,  errors can occur which can change the documentation meaning.   Fatima Blank, MD 02/23/19 [phone]

## 2019-03-07 DIAGNOSIS — N401 Enlarged prostate with lower urinary tract symptoms: Secondary | ICD-10-CM | POA: Diagnosis not present

## 2019-03-07 DIAGNOSIS — N138 Other obstructive and reflux uropathy: Secondary | ICD-10-CM | POA: Diagnosis not present

## 2019-03-07 DIAGNOSIS — Z87898 Personal history of other specified conditions: Secondary | ICD-10-CM | POA: Diagnosis not present

## 2019-03-08 DIAGNOSIS — M48061 Spinal stenosis, lumbar region without neurogenic claudication: Secondary | ICD-10-CM | POA: Diagnosis not present

## 2019-05-23 ENCOUNTER — Other Ambulatory Visit: Payer: Self-pay | Admitting: *Deleted

## 2019-05-23 NOTE — Patient Outreach (Signed)
Member screened for potential [facility] needs as a benefit of [employer].  Per Patient Samuel Gibson member has had multiple ED visits/hospitalizations. However, most recent in Feb. '21.   Telephone call made to Samuel Gibson [phone]. Patient identifiers confirmed.   Explained [facility] Care Management services. Samuel Gibson pleasant declined. States he is doing a lot better since his back surgery in January. States pain control remains a problem at times. However, states he has been working closely with his doctors. Denies having any care management needs at this time.   Expressed appreciation of the call.    Raiford Noble, MSN-Ed, RN,BSN [facility] Care Coordinator [phone] [facility]) [phone]  (Toll free office)

## 2019-05-30 DIAGNOSIS — R7303 Prediabetes: Secondary | ICD-10-CM | POA: Diagnosis not present

## 2019-05-30 DIAGNOSIS — I10 Essential (primary) hypertension: Secondary | ICD-10-CM | POA: Diagnosis not present

## 2019-05-30 DIAGNOSIS — F17200 Nicotine dependence, unspecified, uncomplicated: Secondary | ICD-10-CM | POA: Diagnosis not present

## 2019-05-30 DIAGNOSIS — G894 Chronic pain syndrome: Secondary | ICD-10-CM | POA: Diagnosis not present

## 2019-05-30 DIAGNOSIS — E782 Mixed hyperlipidemia: Secondary | ICD-10-CM | POA: Diagnosis not present

## 2019-05-30 DIAGNOSIS — G4700 Insomnia, unspecified: Secondary | ICD-10-CM | POA: Diagnosis not present

## 2019-08-29 DIAGNOSIS — F17200 Nicotine dependence, unspecified, uncomplicated: Secondary | ICD-10-CM | POA: Diagnosis not present

## 2019-08-29 DIAGNOSIS — M545 Low back pain: Secondary | ICD-10-CM | POA: Diagnosis not present

## 2019-08-29 DIAGNOSIS — G4700 Insomnia, unspecified: Secondary | ICD-10-CM | POA: Diagnosis not present

## 2019-08-29 DIAGNOSIS — I10 Essential (primary) hypertension: Secondary | ICD-10-CM | POA: Diagnosis not present

## 2019-08-29 DIAGNOSIS — E782 Mixed hyperlipidemia: Secondary | ICD-10-CM | POA: Diagnosis not present

## 2019-08-29 DIAGNOSIS — G894 Chronic pain syndrome: Secondary | ICD-10-CM | POA: Diagnosis not present

## 2019-12-12 ENCOUNTER — Other Ambulatory Visit: Payer: Self-pay

## 2019-12-12 ENCOUNTER — Encounter (HOSPITAL_COMMUNITY): Payer: Self-pay

## 2019-12-12 ENCOUNTER — Emergency Department (HOSPITAL_COMMUNITY)
Admission: EM | Admit: 2019-12-12 | Discharge: 2019-12-12 | Disposition: A | Discharge status: Home / Self Care | Payer: Medicare Other | Attending: Emergency Medicine | Admitting: Emergency Medicine

## 2019-12-12 ENCOUNTER — Emergency Department (HOSPITAL_COMMUNITY): Payer: Medicare Other

## 2019-12-12 DIAGNOSIS — L02519 Cutaneous abscess of unspecified hand: Secondary | ICD-10-CM

## 2019-12-12 DIAGNOSIS — F17210 Nicotine dependence, cigarettes, uncomplicated: Secondary | ICD-10-CM | POA: Diagnosis not present

## 2019-12-12 DIAGNOSIS — Z79899 Other long term (current) drug therapy: Secondary | ICD-10-CM | POA: Insufficient documentation

## 2019-12-12 DIAGNOSIS — Z23 Encounter for immunization: Secondary | ICD-10-CM | POA: Diagnosis not present

## 2019-12-12 DIAGNOSIS — R2231 Localized swelling, mass and lump, right upper limb: Secondary | ICD-10-CM | POA: Diagnosis present

## 2019-12-12 DIAGNOSIS — L03011 Cellulitis of right finger: Secondary | ICD-10-CM | POA: Insufficient documentation

## 2019-12-12 DIAGNOSIS — M7989 Other specified soft tissue disorders: Secondary | ICD-10-CM | POA: Diagnosis not present

## 2019-12-12 DIAGNOSIS — Z96612 Presence of left artificial shoulder joint: Secondary | ICD-10-CM | POA: Diagnosis not present

## 2019-12-12 DIAGNOSIS — L02511 Cutaneous abscess of right hand: Secondary | ICD-10-CM | POA: Insufficient documentation

## 2019-12-12 DIAGNOSIS — M795 Residual foreign body in soft tissue: Secondary | ICD-10-CM | POA: Diagnosis not present

## 2019-12-12 DIAGNOSIS — M19041 Primary osteoarthritis, right hand: Secondary | ICD-10-CM | POA: Diagnosis not present

## 2019-12-12 DIAGNOSIS — I10 Essential (primary) hypertension: Secondary | ICD-10-CM | POA: Diagnosis not present

## 2019-12-12 LAB — CBC WITH DIFFERENTIAL/PLATELET
Abs Immature Granulocytes: 0.06 10*3/uL (ref 0.00–0.07)
Basophils Absolute: 0.1 10*3/uL (ref 0.0–0.1)
Basophils Relative: 0 %
Eosinophils Absolute: 0.3 10*3/uL (ref 0.0–0.5)
Eosinophils Relative: 1 %
HCT: 43.3 % (ref 39.0–52.0)
Hemoglobin: 14.6 g/dL (ref 13.0–17.0)
Immature Granulocytes: 0 %
Lymphocytes Relative: 9 %
Lymphs Abs: 1.7 10*3/uL (ref 0.7–4.0)
MCH: 31.6 pg (ref 26.0–34.0)
MCHC: 33.7 g/dL (ref 30.0–36.0)
MCV: 93.7 fL (ref 80.0–100.0)
Monocytes Absolute: 1.8 10*3/uL — ABNORMAL HIGH (ref 0.1–1.0)
Monocytes Relative: 10 %
Neutro Abs: 14.6 10*3/uL — ABNORMAL HIGH (ref 1.7–7.7)
Neutrophils Relative %: 80 %
Platelets: 241 10*3/uL (ref 150–400)
RBC: 4.62 MIL/uL (ref 4.22–5.81)
RDW: 12.6 % (ref 11.5–15.5)
WBC: 18.4 10*3/uL — ABNORMAL HIGH (ref 4.0–10.5)
nRBC: 0 % (ref 0.0–0.2)

## 2019-12-12 LAB — BASIC METABOLIC PANEL
Anion gap: 10 (ref 5–15)
BUN: 9 mg/dL (ref 8–23)
CO2: 23 mmol/L (ref 22–32)
Calcium: 9.1 mg/dL (ref 8.9–10.3)
Chloride: 103 mmol/L (ref 98–111)
Creatinine, Ser: 0.89 mg/dL (ref 0.61–1.24)
GFR, Estimated: >60 mL/min (ref >60)
Glucose, Bld: 109 mg/dL — ABNORMAL HIGH (ref 70–99)
Potassium: 3.9 mmol/L (ref 3.5–5.1)
Sodium: 136 mmol/L (ref 135–145)

## 2019-12-12 MED ORDER — SODIUM CHLORIDE 0.9 % IV SOLN
1.0000 g | Freq: Once | INTRAVENOUS | Status: AC
  Administered 2019-12-12: 1 g via INTRAVENOUS
  Filled 2019-12-12: qty 10

## 2019-12-12 MED ORDER — LIDOCAINE HCL (PF) 1 % IJ SOLN
5.0000 mL | Freq: Once | INTRAMUSCULAR | Status: AC
  Administered 2019-12-12: 5 mL
  Filled 2019-12-12: qty 30

## 2019-12-12 MED ORDER — DOXYCYCLINE HYCLATE 100 MG PO TABS
100.0000 mg | ORAL_TABLET | Freq: Once | ORAL | Status: AC
  Administered 2019-12-12: 100 mg via ORAL
  Filled 2019-12-12: qty 1

## 2019-12-12 MED ORDER — DOXYCYCLINE HYCLATE 100 MG PO CAPS: 100.0000 mg | ORAL_CAPSULE | Freq: Two times a day (BID) | ORAL | 0 refills | Status: AC

## 2019-12-12 MED ORDER — TETANUS-DIPHTH-ACELL PERTUSSIS 5-2.5-18.5 LF-MCG/0.5 IM SUSY
0.5000 mL | PREFILLED_SYRINGE | Freq: Once | INTRAMUSCULAR | Status: AC
  Administered 2019-12-12: 0.5 mL via INTRAMUSCULAR
  Filled 2019-12-12: qty 0.5

## 2019-12-12 NOTE — ED Triage Notes (Signed)
Patient states he was under his house doing duct work 2-3 days ago and thinks he got bit by something.  Patient has redness and swelling to the right hand and a scabbed area to the posterior neck.

## 2019-12-12 NOTE — Discharge Instructions (Signed)
Return if increased redness or swelling.

## 2019-12-12 NOTE — Consult Note (Signed)
Reason for Consult:Hand infection Referring Physician: Ardeen Jourdain Time called: 1026 Time at bedside: 1245   Samuel Gibson is an 64 y.o. male.  HPI: Samuel Gibson woke up Friday morning with right hand pain, redness, and swelling. He had been installing duct work underneath his house the day before but doesn't remember any trauma to the hand. It continued to worsen over the weekend and he comes to the ED today for evaluation. He denies fevers, chills, sweats, N/V, or prior e/o. He is RHD and retired.  Past Medical History:  Diagnosis Date  . Anxiety   . Arthritis   . BPH (benign prostatic hyperplasia)   . Degenerative arthritis of lumbar spine   . Hernia of pelvic floor   . History of kidney stones   . Hypertension     Past Surgical History:  Procedure Laterality Date  . BACK SURGERY    . EYE SURGERY     intraocular lens implants in 12/12/2014 per pt on 01/20/19  . SHOULDER SURGERY    . 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 TOTAL SHOULDER ARTHROPLASTY Left 01/29/2016   Procedure: TOTAL SHOULDER ARTHROPLASTY;  Surgeon: Sheral Apley, MD;  Location: [facility];  Service: Orthopedics;  Laterality: Left;    Family History  Problem Relation Age of Onset  . Diabetes Mother   . Cancer Mother   . Cancer Father   . Diabetes Father     Social History:  reports that he has been smoking cigarettes. He has a 17.00 pack-year smoking history. He has quit using smokeless tobacco.  His smokeless tobacco use included chew. He reports that he does not drink alcohol and does not use drugs.  Allergies: No Known Allergies  Medications: I have reviewed the patient's current medications.  Results for orders placed or performed during the hospital encounter of 12/12/19 (from the past 48 hour(s))  CBC with Differential/Platelet     Status: Abnormal   Collection Time: 12/12/19 11:08 AM  Result Value Ref Range   WBC 18.4 (H) 4.0 - 10.5 K/uL   RBC 4.62 4.22 - 5.81 MIL/uL   Hemoglobin 14.6 13.0 - 17.0 g/dL    HCT 64.1 39 - 52 %   MCV 93.7 80.0 - 100.0 fL   MCH 31.6 26.0 - 34.0 pg   MCHC 33.7 30.0 - 36.0 g/dL   RDW 58.3 09.4 - 07.6 %   Platelets 241 150 - 400 K/uL   nRBC 0.0 0.0 - 0.2 %   Neutrophils Relative % 80 %   Neutro Abs 14.6 (H) 1.7 - 7.7 K/uL   Lymphocytes Relative 9 %   Lymphs Abs 1.7 0.7 - 4.0 K/uL   Monocytes Relative 10 %   Monocytes Absolute 1.8 (H) 0.1 - 1.0 K/uL   Eosinophils Relative 1 %   Eosinophils Absolute 0.3 0.0 - 0.5 K/uL   Basophils Relative 0 %   Basophils Absolute 0.1 0.0 - 0.1 K/uL   Immature Granulocytes 0 %   Abs Immature Granulocytes 0.06 0.00 - 0.07 K/uL    Comment: Performed at [facility], [address] [place], [place] [address]  Basic metabolic panel     Status: Abnormal   Collection Time: 12/12/19 11:08 AM  Result Value Ref Range   Sodium 136 135 - 145 mmol/L   Potassium 3.9 3.5 - 5.1 mmol/L   Chloride 103 98 - 111 mmol/L   CO2 23 22 - 32 mmol/L   Glucose, Bld 109 (H) 70 - 99 mg/dL  Comment: Glucose reference range applies only to samples taken after fasting for at least 8 hours.   BUN 9 8 - 23 mg/dL   Creatinine, Ser 9.50 0.61 - 1.24 mg/dL   Calcium 9.1 8.9 - 93.2 mg/dL   GFR, Estimated >67 >12 mL/min    Comment: (NOTE) Calculated using the CKD-EPI Creatinine Equation (2021)    Anion gap 10 5 - 15    Comment: Performed at [facility], [address] [place], [place] [address]    DG Hand Complete Right  Result Date: 12/12/2019 CLINICAL DATA:  Right hand swelling EXAM: RIGHT HAND - COMPLETE 3+ VIEW COMPARISON:  None. FINDINGS: No acute fracture. No dislocation. No bony erosion or periosteal elevation. Degenerative changes of the right hand most pronounced at the first [facility] joint, first MCP joint, and index finger DIP joint. There is a small 2 mm radiopaque foreign body within the dorsal soft tissues of the ring finger just distal to the level of the PIP joint. There is soft tissue swelling of the  dorsum of the hand. No soft tissue gas. IMPRESSION: 1. No acute osseous abnormality. Soft tissue swelling of the dorsum of the hand. 2. Small 2 mm radiopaque foreign body within the dorsal soft tissues of the ring finger just distal to the level of the PIP joint. Electronically Signed   By: Duanne Guess D.O.   On: 12/12/2019 10:31    Review of Systems  Constitutional: Negative for chills, diaphoresis and fever.  HENT: Negative for ear discharge, ear pain, hearing loss and tinnitus.   Eyes: Negative for photophobia and pain.  Respiratory: Negative for cough and shortness of breath.   Cardiovascular: Negative for chest pain.  Gastrointestinal: Negative for abdominal pain, nausea and vomiting.  Genitourinary: Negative for dysuria, flank pain, frequency and urgency.  Musculoskeletal: Positive for arthralgias (Right hand). Negative for back pain, myalgias and neck pain.  Neurological: Negative for dizziness and headaches.  Hematological: Does not bruise/bleed easily.  Psychiatric/Behavioral: The patient is not nervous/anxious.    Blood pressure (!) 144/97, pulse 95, temperature 99.3 F (37.4 C), temperature source Oral, resp. rate 16, height 5\' 10"  (1.778 m), weight 95.3 kg, SpO2 99 %. Physical Exam Constitutional:      General: He is not in acute distress.    Appearance: He is well-developed. He is not diaphoretic.  HENT:     Head: Normocephalic and atraumatic.  Eyes:     General: No scleral icterus.       Right eye: No discharge.        Left eye: No discharge.     Conjunctiva/sclera: Conjunctivae normal.  Cardiovascular:     Rate and Rhythm: Normal rate and regular rhythm.  Pulmonary:     Effort: Pulmonary effort is normal. No respiratory distress.  Musculoskeletal:     Cervical back: Normal range of motion.     Comments: Right shoulder, elbow, wrist, digits- Erythema, edema ulnar dorsum of hand, small central ulceration, mod TTP, no obvious fluctuance, no pain with wrist  flex/ext, no instability, no blocks to motion  Sens  Ax/R/M/U intact  Mot   Ax/ R/ PIN/ M/ AIN/ U intact  Rad 2+  Skin:    General: Skin is warm and dry.  Neurological:     Mental Status: He is alert.  Psychiatric:        Behavior: Behavior normal.     Assessment/Plan: Right hand infection -- Have advised admission to hospital for IV abx but pt  refuses 2/2 social constraints. Suggested to EDPA they carry out I&D though this may not yield any purulence and discharge on appropriate oral abx. He may f/u with Dr. Roney Mans if he fails to improve.    Freeman Caldron, PA-C Orthopedic Surgery [phone] 12/12/2019, 12:55 PM

## 2019-12-12 NOTE — ED Provider Notes (Signed)
[facility]-EMERGENCY DEPT Provider Note   CSN: [id number] Arrival date & time: 12/12/19  0920     History Chief Complaint  Patient presents with  . Hand Pain    Samuel Gibson is a 64 y.o. male.  The history is provided by the patient. No language interpreter was used.  Hand Pain This is a new problem. The problem occurs constantly. The problem has been gradually worsening. Nothing aggravates the symptoms. Nothing relieves the symptoms. He has tried nothing for the symptoms. The treatment provided moderate relief.  Pt reports he thinks something bit him on his right hand.  Pt complains of swelling and pain.  He reports he also had bites on his neck     Past Medical History:  Diagnosis Date  . Anxiety   . Arthritis   . BPH (benign prostatic hyperplasia)   . Degenerative arthritis of lumbar spine   . Hernia of pelvic floor   . History of kidney stones   . Hypertension     Patient Active Problem List   Diagnosis Date Noted  . S/P lumbar fusion 01/24/2019  . Back pain 01/14/2016  . History of lumbar surgery 01/14/2016  . Essential hypertension 01/14/2016  . Hx of renal calculi 01/14/2016  . Chronic pain 01/14/2016  . Primary osteoarthritis, left shoulder 01/14/2016    Past Surgical History:  Procedure Laterality Date  . BACK SURGERY    . EYE SURGERY     intraocular lens implants in 12/12/2014 per pt on 01/20/19  . SHOULDER SURGERY    . 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 TOTAL SHOULDER ARTHROPLASTY Left 01/29/2016   Procedure: TOTAL SHOULDER ARTHROPLASTY;  Surgeon: Sheral Apley, MD;  Location: [facility] OR;  Service: Orthopedics;  Laterality: Left;       Family History  Problem Relation Age of Onset  . Diabetes Mother   . Cancer Mother   . Cancer Father   . Diabetes Father     Social History   Tobacco Use  . Smoking status: Current Every Day Smoker    Packs/day: 0.50    Years: 34.00    Pack years: 17.00    Types: Cigarettes    . Smokeless tobacco: Former Neurosurgeon    Types: Engineer, drilling  . Vaping Use: Never used  Substance Use Topics  . Alcohol use: No  . Drug use: No    Home Medications Prior to Admission medications   Medication Sig Start Date End Date Taking? Authorizing Provider  aspirin EC 81 MG tablet Take 1 tablet (81 mg total) by mouth daily. For 30 days for dvt prophylaxis Patient not taking: Reported on 01/13/2019 01/29/16   Albina Billet III, PA-C  ibuprofen (ADVIL) 800 MG tablet Take 800 mg by mouth every 8 (eight) hours as needed for moderate pain.    [provider]  methocarbamol (ROBAXIN) 500 MG tablet Take 1 tablet (500 mg total) by mouth every 6 (six) hours as needed for muscle spasms. 01/26/19   Tia Alert, MD  metoprolol succinate (TOPROL-XL) 50 MG 24 hr tablet Take 50 mg by mouth at bedtime. Take with or immediately following a meal.     [provider]  ondansetron (ZOFRAN ODT) 4 MG disintegrating tablet Take 1 tablet (4 mg total) by mouth every 8 (eight) hours as needed for nausea or vomiting. Patient not taking: Reported on 07/22/2018 03/24/18   Harlene Salts A, PA-C  ondansetron (ZOFRAN) 4 MG tablet Take 1  tablet (4 mg total) by mouth every 8 (eight) hours as needed for nausea or vomiting. Patient not taking: Reported on 07/22/2018 03/16/18   Mesner, Barbara Cower, MD  Oxycodone HCl 20 MG TABS Take 20 mg by mouth every 6 (six) hours as needed (pain).     [provider]  polyethylene glycol (MIRALAX / GLYCOLAX) 17 g packet Take 17 g by mouth daily. 01/28/19   Charlynne Pander, MD  rosuvastatin (CRESTOR) 10 MG tablet Take 10 mg by mouth at bedtime. 12/20/18   [provider]  tamsulosin (FLOMAX) 0.4 MG CAPS capsule Take 1 capsule (0.4 mg total) by mouth daily. 01/26/19   Tia Alert, MD    Allergies    Patient has no known allergies.  Review of Systems   Review of Systems  Musculoskeletal: Positive for joint swelling and myalgias.  Skin:  Positive for wound.  All other systems reviewed and are negative.   Physical Exam Updated Vital Signs BP (!) 144/97 (BP Location: Left Arm)   Pulse 95   Temp 99.3 F (37.4 C) (Oral)   Resp 16   Ht 5\' 10"  (1.778 m)   Wt 95.3 kg   SpO2 99%   BMI 30.13 kg/m   Physical Exam Vitals reviewed.  Cardiovascular:     Rate and Rhythm: Normal rate.  Pulmonary:     Effort: Pulmonary effort is normal.  Musculoskeletal:        General: Swelling and tenderness present.  Skin:    General: Skin is warm.     Findings: Erythema present.  Neurological:     General: No focal deficit present.     Mental Status: He is alert.  Psychiatric:        Mood and Affect: Mood normal.     ED Results / Procedures / Treatments   Labs (all labs ordered are listed, but only abnormal results are displayed) Labs Reviewed  CBC WITH DIFFERENTIAL/PLATELET - Abnormal; Notable for the following components:      Result Value   WBC 18.4 (*)    Neutro Abs 14.6 (*)    Monocytes Absolute 1.8 (*)    All other components within normal limits  BASIC METABOLIC PANEL    EKG None  Radiology DG Hand Complete Right  Result Date: 12/12/2019 CLINICAL DATA:  Right hand swelling EXAM: RIGHT HAND - COMPLETE 3+ VIEW COMPARISON:  None. FINDINGS: No acute fracture. No dislocation. No bony erosion or periosteal elevation. Degenerative changes of the right hand most pronounced at the first [place] joint, first MCP joint, and index finger DIP joint. There is a small 2 mm radiopaque foreign body within the dorsal soft tissues of the ring finger just distal to the level of the PIP joint. There is soft tissue swelling of the dorsum of the hand. No soft tissue gas. IMPRESSION: 1. No acute osseous abnormality. Soft tissue swelling of the dorsum of the hand. 2. Small 2 mm radiopaque foreign body within the dorsal soft tissues of the ring finger just distal to the level of the PIP joint. Electronically Signed   By: [facility]    On: 12/12/2019 10:31        Procedures .14/06/2021Incision and Drainage  Date/Time: 12/12/2019 1:49 PM Performed by: 14/06/2019, PA-C Authorized by: Elson Areas, PA-C   Consent:    Consent obtained:  Verbal   Consent given by:  Patient   Risks discussed:  Bleeding, incomplete drainage, pain and damage to other organs  Alternatives discussed:  No treatment Universal protocol:    Procedure explained and questions answered to patient or proxy's satisfaction: yes     Relevant documents present and verified: yes     Test results available and properly labeled: yes     Imaging studies available: yes     Required blood products, implants, devices, and special equipment available: yes     Site/side marked: yes     Immediately prior to procedure a time out was called: yes     Patient identity confirmed:  Verbally with patient Location:    Type:  Abscess   Location: hand. Pre-procedure details:    Skin preparation:  Betadine Anesthesia (see MAR for exact dosages):    Anesthesia method:  Local infiltration   Local anesthetic:  Lidocaine 1% w/o epi Procedure type:    Complexity:  Complex Procedure details:    Incision types:  Single straight and cruciate   Incision depth:  Subcutaneous   Scalpel blade:  11   Wound management:  Probed and deloculated, irrigated with saline and extensive cleaning   Drainage:  Purulent   Drainage amount:  Scant   Wound treatment:  Wound left open   Packing materials:  1/4 in gauze Post-procedure details:    Patient tolerance of procedure:  Tolerated well, no immediate complications Comments:     Small abscess area localized with ultrasound   (including critical care time)  Medications Ordered in ED Medications  cefTRIAXone (ROCEPHIN) 1 g in sodium chloride 0.9 % 100 mL IVPB (1 g Intravenous New Bag/Given (Non-Interop) 12/12/19 1129)  doxycycline (VIBRA-TABS) tablet 100 mg (100 mg Oral Given 12/12/19 1129)    ED Course  I have reviewed the  triage vital signs and the nursing notes.  Pertinent labs & imaging results that were available during my care of the patient were reviewed by me and considered in my medical decision making (see chart for details).    MDM Rules/Calculators/A&P                          MDM: Labs ordered, xray reviewed,  Pt has a foreign body but not near area of infection.  Pt given rocephin IV and oral doxycycline.   Dale Mustang evaluated here and advised I and D and follow up with Dr. Roney Mans.  Pt declined admission.  Pt reports he has animals and family he has to care for.  Pt advised of risk of declining admission.   Final Clinical Impression(s) / ED Diagnoses Final diagnoses:  Cellulitis and abscess of hand, except fingers and thumb  Cellulitis of finger of right hand    Rx / DC Orders ED Discharge Orders         Ordered    doxycycline (VIBRAMYCIN) 100 MG capsule  2 times daily        12/12/19 1339        An After Visit Summary was printed and given to the patient.   Elson Areas, PA-C 12/12/19 1352    Rolan Bucco, MD 12/12/19 1430

## 2019-12-15 DIAGNOSIS — E782 Mixed hyperlipidemia: Secondary | ICD-10-CM | POA: Diagnosis not present

## 2019-12-15 DIAGNOSIS — K219 Gastro-esophageal reflux disease without esophagitis: Secondary | ICD-10-CM | POA: Diagnosis not present

## 2019-12-15 DIAGNOSIS — G894 Chronic pain syndrome: Secondary | ICD-10-CM | POA: Diagnosis not present

## 2019-12-15 DIAGNOSIS — F17200 Nicotine dependence, unspecified, uncomplicated: Secondary | ICD-10-CM | POA: Diagnosis not present

## 2019-12-15 DIAGNOSIS — Z125 Encounter for screening for malignant neoplasm of prostate: Secondary | ICD-10-CM | POA: Diagnosis not present

## 2019-12-15 DIAGNOSIS — I10 Essential (primary) hypertension: Secondary | ICD-10-CM | POA: Diagnosis not present

## 2019-12-15 DIAGNOSIS — G4700 Insomnia, unspecified: Secondary | ICD-10-CM | POA: Diagnosis not present

## 2019-12-15 DIAGNOSIS — R7303 Prediabetes: Secondary | ICD-10-CM | POA: Diagnosis not present

## 2019-12-15 DIAGNOSIS — Z23 Encounter for immunization: Secondary | ICD-10-CM | POA: Diagnosis not present

## 2019-12-20 DIAGNOSIS — M79641 Pain in right hand: Secondary | ICD-10-CM | POA: Diagnosis not present

## 2020-03-07 ENCOUNTER — Encounter (HOSPITAL_COMMUNITY): Payer: Self-pay

## 2020-03-07 ENCOUNTER — Emergency Department (HOSPITAL_COMMUNITY)
Admission: EM | Admit: 2020-03-07 | Discharge: 2020-03-07 | Disposition: A | Discharge status: Home / Self Care | Payer: Medicare Other | Attending: Emergency Medicine | Admitting: Emergency Medicine

## 2020-03-07 DIAGNOSIS — Z79899 Other long term (current) drug therapy: Secondary | ICD-10-CM | POA: Insufficient documentation

## 2020-03-07 DIAGNOSIS — G8929 Other chronic pain: Secondary | ICD-10-CM | POA: Diagnosis not present

## 2020-03-07 DIAGNOSIS — M545 Low back pain, unspecified: Secondary | ICD-10-CM | POA: Diagnosis not present

## 2020-03-07 DIAGNOSIS — Z96612 Presence of left artificial shoulder joint: Secondary | ICD-10-CM | POA: Diagnosis not present

## 2020-03-07 DIAGNOSIS — M79672 Pain in left foot: Secondary | ICD-10-CM | POA: Diagnosis not present

## 2020-03-07 DIAGNOSIS — I10 Essential (primary) hypertension: Secondary | ICD-10-CM | POA: Diagnosis not present

## 2020-03-07 DIAGNOSIS — M25552 Pain in left hip: Secondary | ICD-10-CM | POA: Diagnosis not present

## 2020-03-07 DIAGNOSIS — R109 Unspecified abdominal pain: Secondary | ICD-10-CM | POA: Diagnosis not present

## 2020-03-07 DIAGNOSIS — F17210 Nicotine dependence, cigarettes, uncomplicated: Secondary | ICD-10-CM | POA: Diagnosis not present

## 2020-03-07 DIAGNOSIS — M549 Dorsalgia, unspecified: Secondary | ICD-10-CM | POA: Diagnosis not present

## 2020-03-07 MED ORDER — MORPHINE SULFATE (PF) 4 MG/ML IV SOLN
8.0000 mg | Freq: Once | INTRAVENOUS | Status: AC
  Administered 2020-03-07: 8 mg via INTRAMUSCULAR
  Filled 2020-03-07: qty 2

## 2020-03-07 MED ORDER — OXYCODONE HCL ER 10 MG PO T12A
20.0000 mg | EXTENDED_RELEASE_TABLET | Freq: Two times a day (BID) | ORAL | Status: DC
  Administered 2020-03-07: 20 mg via ORAL
  Filled 2020-03-07: qty 2

## 2020-03-07 MED ORDER — DIAZEPAM 5 MG PO TABS
10.0000 mg | ORAL_TABLET | Freq: Once | ORAL | Status: AC
  Administered 2020-03-07: 10 mg via ORAL
  Filled 2020-03-07: qty 2

## 2020-03-07 MED ORDER — DIAZEPAM 5 MG PO TABS: 5.0000 mg | ORAL_TABLET | Freq: Once | ORAL | Status: DC

## 2020-03-07 NOTE — ED Provider Notes (Signed)
[facility]-EMERGENCY DEPT Provider Note   CSN: [id number] Arrival date & time: 03/07/20  1057     History Chief Complaint  Patient presents with  . Back Pain    Samuel Gibson is a 65 y.o. male.  65 year old male with history of chronic back pain presents with worsening left-sided back discomfort.  Patient states that he was reaching to get something and pain began.  Started in his left flank and with down to his left foot.  Denies any urinary symptoms.  Pain is characterizes sharp and worse with movement.  No bowel or bladder dysfunction.  Ran out of his home medications recently        Past Medical History:  Diagnosis Date  . Anxiety   . Arthritis   . BPH (benign prostatic hyperplasia)   . Degenerative arthritis of lumbar spine   . Hernia of pelvic floor   . History of kidney stones   . Hypertension     Patient Active Problem List   Diagnosis Date Noted  . S/P lumbar fusion 01/24/2019  . Back pain 01/14/2016  . History of lumbar surgery 01/14/2016  . Essential hypertension 01/14/2016  . Hx of renal calculi 01/14/2016  . Chronic pain 01/14/2016  . Primary osteoarthritis, left shoulder 01/14/2016    Past Surgical History:  Procedure Laterality Date  . BACK SURGERY    . EYE SURGERY     intraocular lens implants in 12/12/2014 per pt on 01/20/19  . SHOULDER SURGERY    . 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 TOTAL SHOULDER ARTHROPLASTY Left 01/29/2016   Procedure: TOTAL SHOULDER ARTHROPLASTY;  Surgeon: Sheral Apley, MD;  Location: [facility] OR;  Service: Orthopedics;  Laterality: Left;       Family History  Problem Relation Age of Onset  . Diabetes Mother   . Cancer Mother   . Cancer Father   . Diabetes Father     Social History   Tobacco Use  . Smoking status: Current Every Day Smoker    Packs/day: 0.50    Years: 34.00    Pack years: 17.00    Types: Cigarettes  . Smokeless tobacco: Former Neurosurgeon    Types: Engineer, drilling  .  Vaping Use: Never used  Substance Use Topics  . Alcohol use: No  . Drug use: No    Home Medications Prior to Admission medications   Medication Sig Start Date End Date Taking? Authorizing Provider  aspirin EC 81 MG tablet Take 1 tablet (81 mg total) by mouth daily. For 30 days for dvt prophylaxis Patient not taking: Reported on 01/13/2019 01/29/16   Albina Billet III, PA-C  doxycycline (VIBRAMYCIN) 100 MG capsule Take 1 capsule (100 mg total) by mouth 2 (two) times daily. 12/12/19   Elson Areas, PA-C  ibuprofen (ADVIL) 800 MG tablet Take 800 mg by mouth every 8 (eight) hours as needed for moderate pain.    [provider]  methocarbamol (ROBAXIN) 500 MG tablet Take 1 tablet (500 mg total) by mouth every 6 (six) hours as needed for muscle spasms. 01/26/19   Tia Alert, MD  metoprolol succinate (TOPROL-XL) 50 MG 24 hr tablet Take 50 mg by mouth at bedtime. Take with or immediately following a meal.     [provider]  ondansetron (ZOFRAN ODT) 4 MG disintegrating tablet Take 1 tablet (4 mg total) by mouth every 8 (eight) hours as needed for nausea or vomiting. Patient not taking: Reported on  07/22/2018 03/24/18   Harlene Salts A, PA-C  ondansetron (ZOFRAN) 4 MG tablet Take 1 tablet (4 mg total) by mouth every 8 (eight) hours as needed for nausea or vomiting. Patient not taking: Reported on 07/22/2018 03/16/18   Mesner, Barbara Cower, MD  Oxycodone HCl 20 MG TABS Take 20 mg by mouth every 6 (six) hours as needed (pain).     [provider]  polyethylene glycol (MIRALAX / GLYCOLAX) 17 g packet Take 17 g by mouth daily. 01/28/19   Charlynne Pander, MD  rosuvastatin (CRESTOR) 10 MG tablet Take 10 mg by mouth at bedtime. 12/20/18   [provider]  tamsulosin (FLOMAX) 0.4 MG CAPS capsule Take 1 capsule (0.4 mg total) by mouth daily. 01/26/19   Tia Alert, MD    Allergies    Patient has no known allergies.  Review of Systems   Review of Systems  All  other systems reviewed and are negative.   Physical Exam Updated Vital Signs BP 118/76 (BP Location: Right Arm)   Pulse 65   Temp 98.5 F (36.9 C) (Oral)   Resp 16   Ht 1.778 m (5\' 10" )   Wt 88.5 kg   SpO2 99%   BMI 27.98 kg/m   Physical Exam Vitals and nursing note reviewed.  Constitutional:      General: He is not in acute distress.    Appearance: Normal appearance. He is well-developed and well-nourished. He is not toxic-appearing.  HENT:     Head: Normocephalic and atraumatic.  Eyes:     General: Lids are normal.     Extraocular Movements: EOM normal.     Conjunctiva/sclera: Conjunctivae normal.     Pupils: Pupils are equal, round, and reactive to light.  Neck:     Thyroid: No thyroid mass.     Trachea: No tracheal deviation.  Cardiovascular:     Rate and Rhythm: Normal rate and regular rhythm.     Heart sounds: Normal heart sounds. No murmur heard. No gallop.   Pulmonary:     Effort: Pulmonary effort is normal. No respiratory distress.     Breath sounds: Normal breath sounds. No stridor. No decreased breath sounds, wheezing, rhonchi or rales.  Abdominal:     General: Bowel sounds are normal. There is no distension.     Palpations: Abdomen is soft.     Tenderness: There is no abdominal tenderness. There is no CVA tenderness or rebound.  Musculoskeletal:        General: No tenderness or edema. Normal range of motion.     Cervical back: Normal range of motion and neck supple.       Back:     Comments: Pain noted with straight raise legs  Skin:    General: Skin is warm and dry.     Findings: No abrasion or rash.  Neurological:     General: No focal deficit present.     Mental Status: He is alert and oriented to person, place, and time.     GCS: GCS eye subscore is 4. GCS verbal subscore is 5. GCS motor subscore is 6.     Cranial Nerves: No cranial nerve deficit.     Sensory: No sensory deficit.     Deep Tendon Reflexes: Strength normal.  Psychiatric:         Mood and Affect: Mood and affect normal.        Speech: Speech normal.        Behavior: Behavior normal.  ED Results / Procedures / Treatments   Labs (all labs ordered are listed, but only abnormal results are displayed) Labs Reviewed - No data to display  EKG None  Radiology No results found.  Procedures Procedures   Medications Ordered in ED Medications  morphine 4 MG/ML injection 8 mg (has no administration in time range)  diazepam (VALIUM) tablet 10 mg (has no administration in time range)    ED Course  I have reviewed the triage vital signs and the nursing notes.  Pertinent labs & imaging results that were available during my care of the patient were reviewed by me and considered in my medical decision making (see chart for details).    MDM Rules/Calculators/A&P                         Patient medicated for pain and feels better.  Neurological exam is stable.  Will give oral medication here and he will follow-up with his doctor  Final Clinical Impression(s) / ED Diagnoses Final diagnoses:  None    Rx / DC Orders ED Discharge Orders    None       Lorre Nick, MD 03/07/20 1308

## 2020-03-07 NOTE — ED Triage Notes (Signed)
Pt BIB [facility] from home c/o acute onset severe left-sided hip pain radiating down leg and up into back. Ongoing issues but never like this. No obvious deformity/shortening. Unable to bear weight. Been seeing orthopedist. Pain rated 10/10. Ran out of rx for 20mg  oxy last night.  BP 180/100 HR 68 RR 22 SpO2 98% RA

## 2020-03-14 DIAGNOSIS — G4700 Insomnia, unspecified: Secondary | ICD-10-CM | POA: Diagnosis not present

## 2020-03-14 DIAGNOSIS — R7303 Prediabetes: Secondary | ICD-10-CM | POA: Diagnosis not present

## 2020-03-14 DIAGNOSIS — E782 Mixed hyperlipidemia: Secondary | ICD-10-CM | POA: Diagnosis not present

## 2020-03-14 DIAGNOSIS — F17200 Nicotine dependence, unspecified, uncomplicated: Secondary | ICD-10-CM | POA: Diagnosis not present

## 2020-03-14 DIAGNOSIS — I10 Essential (primary) hypertension: Secondary | ICD-10-CM | POA: Diagnosis not present

## 2020-03-14 DIAGNOSIS — G894 Chronic pain syndrome: Secondary | ICD-10-CM | POA: Diagnosis not present

## 2020-06-14 DIAGNOSIS — I10 Essential (primary) hypertension: Secondary | ICD-10-CM | POA: Diagnosis not present

## 2020-06-14 DIAGNOSIS — F17200 Nicotine dependence, unspecified, uncomplicated: Secondary | ICD-10-CM | POA: Diagnosis not present

## 2020-06-14 DIAGNOSIS — G894 Chronic pain syndrome: Secondary | ICD-10-CM | POA: Diagnosis not present

## 2020-06-14 DIAGNOSIS — M5416 Radiculopathy, lumbar region: Secondary | ICD-10-CM | POA: Diagnosis not present

## 2020-06-14 DIAGNOSIS — R7303 Prediabetes: Secondary | ICD-10-CM | POA: Diagnosis not present

## 2020-06-14 DIAGNOSIS — K219 Gastro-esophageal reflux disease without esophagitis: Secondary | ICD-10-CM | POA: Diagnosis not present

## 2020-06-14 DIAGNOSIS — E782 Mixed hyperlipidemia: Secondary | ICD-10-CM | POA: Diagnosis not present

## 2020-09-21 DIAGNOSIS — F17200 Nicotine dependence, unspecified, uncomplicated: Secondary | ICD-10-CM | POA: Diagnosis not present

## 2020-09-21 DIAGNOSIS — R7303 Prediabetes: Secondary | ICD-10-CM | POA: Diagnosis not present

## 2020-09-21 DIAGNOSIS — I10 Essential (primary) hypertension: Secondary | ICD-10-CM | POA: Diagnosis not present

## 2020-09-21 DIAGNOSIS — E782 Mixed hyperlipidemia: Secondary | ICD-10-CM | POA: Diagnosis not present

## 2020-09-21 DIAGNOSIS — K219 Gastro-esophageal reflux disease without esophagitis: Secondary | ICD-10-CM | POA: Diagnosis not present

## 2020-09-21 DIAGNOSIS — G894 Chronic pain syndrome: Secondary | ICD-10-CM | POA: Diagnosis not present

## 2020-09-21 DIAGNOSIS — Z23 Encounter for immunization: Secondary | ICD-10-CM | POA: Diagnosis not present

## 2020-09-21 DIAGNOSIS — N5201 Erectile dysfunction due to arterial insufficiency: Secondary | ICD-10-CM | POA: Diagnosis not present

## 2021-01-31 DIAGNOSIS — G894 Chronic pain syndrome: Secondary | ICD-10-CM | POA: Diagnosis not present

## 2021-01-31 DIAGNOSIS — M5459 Other low back pain: Secondary | ICD-10-CM | POA: Diagnosis not present

## 2021-03-16 ENCOUNTER — Emergency Department (HOSPITAL_COMMUNITY): Payer: Medicare Other

## 2021-03-16 ENCOUNTER — Emergency Department (HOSPITAL_COMMUNITY)
Admission: EM | Admit: 2021-03-16 | Discharge: 2021-03-16 | Disposition: A | Discharge status: Home / Self Care | Payer: Medicare Other | Attending: Emergency Medicine | Admitting: Emergency Medicine

## 2021-03-16 ENCOUNTER — Encounter (HOSPITAL_COMMUNITY): Payer: Self-pay

## 2021-03-16 DIAGNOSIS — Z96612 Presence of left artificial shoulder joint: Secondary | ICD-10-CM | POA: Diagnosis not present

## 2021-03-16 DIAGNOSIS — M7989 Other specified soft tissue disorders: Secondary | ICD-10-CM | POA: Diagnosis not present

## 2021-03-16 DIAGNOSIS — S2232XA Fracture of one rib, left side, initial encounter for closed fracture: Secondary | ICD-10-CM | POA: Diagnosis not present

## 2021-03-16 DIAGNOSIS — X509XXA Other and unspecified overexertion or strenuous movements or postures, initial encounter: Secondary | ICD-10-CM | POA: Insufficient documentation

## 2021-03-16 DIAGNOSIS — Z7982 Long term (current) use of aspirin: Secondary | ICD-10-CM | POA: Insufficient documentation

## 2021-03-16 DIAGNOSIS — S2242XA Multiple fractures of ribs, left side, initial encounter for closed fracture: Secondary | ICD-10-CM | POA: Diagnosis not present

## 2021-03-16 DIAGNOSIS — Y92000 Kitchen of unspecified non-institutional (private) residence as  the place of occurrence of the external cause: Secondary | ICD-10-CM | POA: Insufficient documentation

## 2021-03-16 DIAGNOSIS — S299XXA Unspecified injury of thorax, initial encounter: Secondary | ICD-10-CM | POA: Diagnosis present

## 2021-03-16 DIAGNOSIS — R0781 Pleurodynia: Secondary | ICD-10-CM | POA: Diagnosis present

## 2021-03-16 MED ORDER — LIDOCAINE 5 % EX PTCH
1.0000 | MEDICATED_PATCH | CUTANEOUS | Status: DC
  Administered 2021-03-16: 1 via TRANSDERMAL
  Filled 2021-03-16: qty 1

## 2021-03-16 NOTE — ED Triage Notes (Signed)
Pt states pain to right side, worsening with mvmt, turned while in kitchen to grab something and felt and heard a pop. Now pain to rib cage.  ?

## 2021-03-16 NOTE — Discharge Instructions (Signed)
You have a fracture of the 10th left rib which is what is causing your pain.  I have sent you home with something called incentive spirometry which I recommend that you use 2-3 times a day to prevent pneumonia developing.  I also put him on a lidocaine patch and you can pick this up over-the-counter at any drugstore.  It is called Salonpas.  Unfortunately, there is not much we can do for rib fractures other than treated symptomatically.  You are already on a high dose of oxycodone for your arthritis.  If you need something extra I recommend following up with a doctor who prescribes this.  Unfortunately for fractures just take time to heal.  Return if you have difficulty breathing or shortness of breath. ?

## 2021-03-16 NOTE — ED Provider Notes (Signed)
?[facility] DEPT ?Provider Note ? ? ?CSN: [id number] ?Arrival date & time: 03/16/21  1326 ? ?  ? ?History ? ?Chief Complaint  ?Patient presents with  ? Rib Injury  ? ? ?Samuel Gibson is a 66 y.o. male who presents to the ED for evaluation of left rib pain that has been ongoing for a few days with sudden increase in pain yesterday.  Patient states that when he was in the kitchen, he turned to the left when he felt a "snap" and has had significant pain since then.  Pain is constant, worse with cough and deep breathing.  Worse with lying down or movement.  Patient is on 20 mg oxycodone daily for his arthritis and states this did not affected hip pain at all.  He denies chest pain, urinary symptoms, hematuria and all other complaints. ? ?HPI ? ?  ? ?Home Medications ?Prior to Admission medications   ?Medication Sig Start Date End Date Taking? Authorizing Provider  ?aspirin EC 81 MG tablet Take 1 tablet (81 mg total) by mouth daily. For 30 days for dvt prophylaxis ?Patient not taking: Reported on 01/13/2019 01/29/16   Prudencio Burly III, PA-C  ?doxycycline (VIBRAMYCIN) 100 MG capsule Take 1 capsule (100 mg total) by mouth 2 (two) times daily. 12/12/19   Fransico Meadow, PA-C  ?ibuprofen (ADVIL) 800 MG tablet Take 800 mg by mouth every 8 (eight) hours as needed for moderate pain.    [provider]  ?methocarbamol (ROBAXIN) 500 MG tablet Take 1 tablet (500 mg total) by mouth every 6 (six) hours as needed for muscle spasms. 01/26/19   Eustace Moore, MD  ?metoprolol succinate (TOPROL-XL) 50 MG 24 hr tablet Take 50 mg by mouth at bedtime. Take with or immediately following a meal.     [provider]  ?ondansetron (ZOFRAN ODT) 4 MG disintegrating tablet Take 1 tablet (4 mg total) by mouth every 8 (eight) hours as needed for nausea or vomiting. ?Patient not taking: Reported on 07/22/2018 03/24/18   Nuala Alpha A, PA-C  ?ondansetron (ZOFRAN) 4 MG tablet Take 1 tablet (4 mg  total) by mouth every 8 (eight) hours as needed for nausea or vomiting. ?Patient not taking: Reported on 07/22/2018 03/16/18   Mesner, Corene Cornea, MD  ?Oxycodone HCl 20 MG TABS Take 20 mg by mouth every 6 (six) hours as needed (pain).     [provider]  ?polyethylene glycol (MIRALAX / GLYCOLAX) 17 g packet Take 17 g by mouth daily. 01/28/19   Drenda Freeze, MD  ?rosuvastatin (CRESTOR) 10 MG tablet Take 10 mg by mouth at bedtime. 12/20/18   [provider]  ?tamsulosin (FLOMAX) 0.4 MG CAPS capsule Take 1 capsule (0.4 mg total) by mouth daily. 01/26/19   Eustace Moore, MD  ?   ? ?Allergies    ?Patient has no known allergies.   ? ?Review of Systems   ?Review of Systems ? ?Physical Exam ?Updated Vital Signs ?BP 140/80 (BP Location: Right Arm)   Pulse 69   Temp 98 ?F (36.7 ?C) (Oral)   Resp 16   SpO2 97%  ?Physical Exam ?Vitals and nursing note reviewed.  ?Constitutional:   ?   General: He is not in acute distress. ?   Appearance: He is not ill-appearing.  ?HENT:  ?   Head: Atraumatic.  ?Eyes:  ?   Conjunctiva/sclera: Conjunctivae normal.  ?Cardiovascular:  ?   Rate and Rhythm: Normal rate and regular rhythm.  ?  Pulses: Normal pulses.  ?   Heart sounds: No murmur heard. ?Pulmonary:  ?   Effort: Pulmonary effort is normal. No respiratory distress.  ?   Breath sounds: Normal breath sounds.  ?Chest:  ? ? ?   Comments: Focal tenderness to the left lateral rib cage with mild swelling ?Abdominal:  ?   General: Abdomen is flat. There is no distension.  ?   Palpations: Abdomen is soft.  ?   Tenderness: There is no abdominal tenderness.  ?Musculoskeletal:     ?   General: Normal range of motion.  ?   Cervical back: Normal range of motion.  ?Skin: ?   General: Skin is warm and dry.  ?   Capillary Refill: Capillary refill takes less than 2 seconds.  ?Neurological:  ?   General: No focal deficit present.  ?   Mental Status: He is alert.  ?Psychiatric:     ?   Mood and Affect: Mood normal.  ? ? ?ED Results /  Procedures / Treatments   ?Labs ?(all labs ordered are listed, but only abnormal results are displayed) ?Labs Reviewed - No data to display ? ?EKG ?None ? ?Radiology ?No results found. ? ?Procedures ?Procedures  ?{ ?Medications Ordered in ED ?Medications - No data to display ? ?ED Course/ Medical Decision Making/ A&P ?  ?                        ?Medical Decision Making ?Amount and/or Complexity of Data Reviewed ?Radiology: ordered. ? ? ?History:  ?Per HPI ?Social determinants of health: none ? ?Initial impression: ? ?This patient presents to the ED for concern of rib injury, this involves an extensive number of treatment options, and is a complaint that carries with it a high risk of complications and morbidity.    ?Patient is 66 years old, overall well-appearing in no acute distress.  He has focal tenderness of the left lateral with notable swelling. Negative CVA tenderness, negative abdominal tenderness. Will obtain left rib study. ? ? ?Imaging Studies ordered: ? ?I ordered imaging studies including  ?Left rib study with chest x-ray shows nondisplaced fracture of the 10th anterior rib ?I independently visualized and interpreted imaging and I agree with the radiologist interpretation.  ? ? ? ?Medicines ordered and prescription drug management: ? ?I ordered medication including: ?Lidocaine patch ?Patient discharged prior to assessing response. ?I have reviewed the patients home medicines and have made adjustments as needed ? ? ?Disposition: ? ?After consideration of the diagnostic results, physical exam, history and the patients response to treatment feel that the patent would benefit from discharge.   ?Left rib fracture of the 10th rib: Discussed with patient the use of incentive spirometry to prevent developing pneumonia from guarded breathing of his rib fracture.  Recommend lidocaine patches.  He is already on a high dose of oxycodone and 800 mg ibuprofen as prescribed by his PCP.  Advised him to follow-up with  his PCP if he requires something stronger than this.  We discussed that we will likely just take time to fully heal.  Patient endorses understanding.  Return precautions were discussed and he was discharged home in good condition. ? ? ? ?Final Clinical Impression(s) / ED Diagnoses ?Final diagnoses:  ?Closed fracture of one rib of left side, initial encounter  ? ? ?Rx / DC Orders ?ED Discharge Orders   ? ? None  ? ?  ? ? ?  ?Tonye Pearson, PA-C ?03/16/21  1503 ? ?  ?Lorelle Gibbs, DO ?03/16/21 1541 ? ?

## 2021-04-08 IMAGING — CR DG CHEST 2V
2 series · 2 of 2 positions shown · non-contrast
Comparison: 03/24/2018

CLINICAL DATA: Preoperative, hypertension

EXAM:
CHEST - 2 VIEW

[w chest pa]
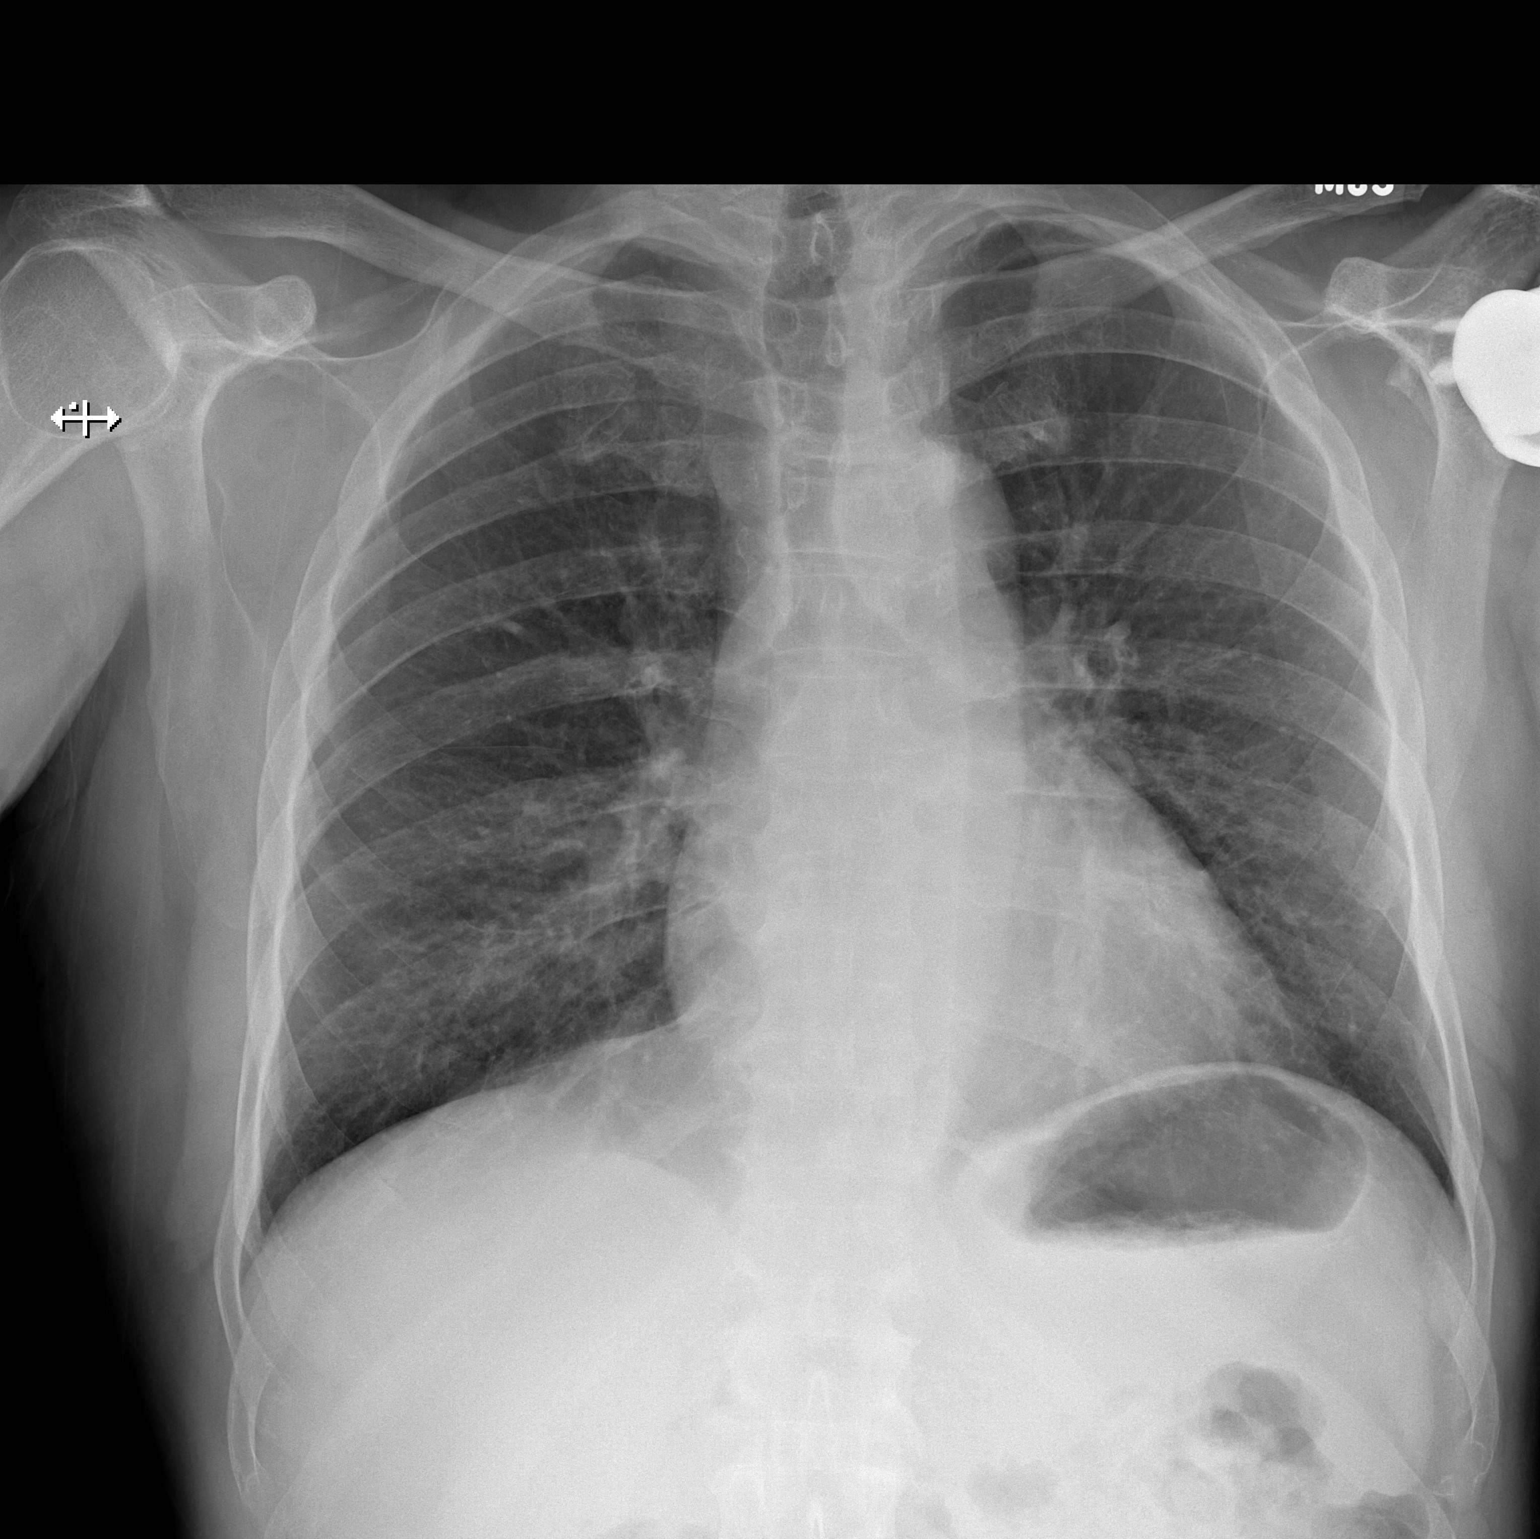

[w chest lat]
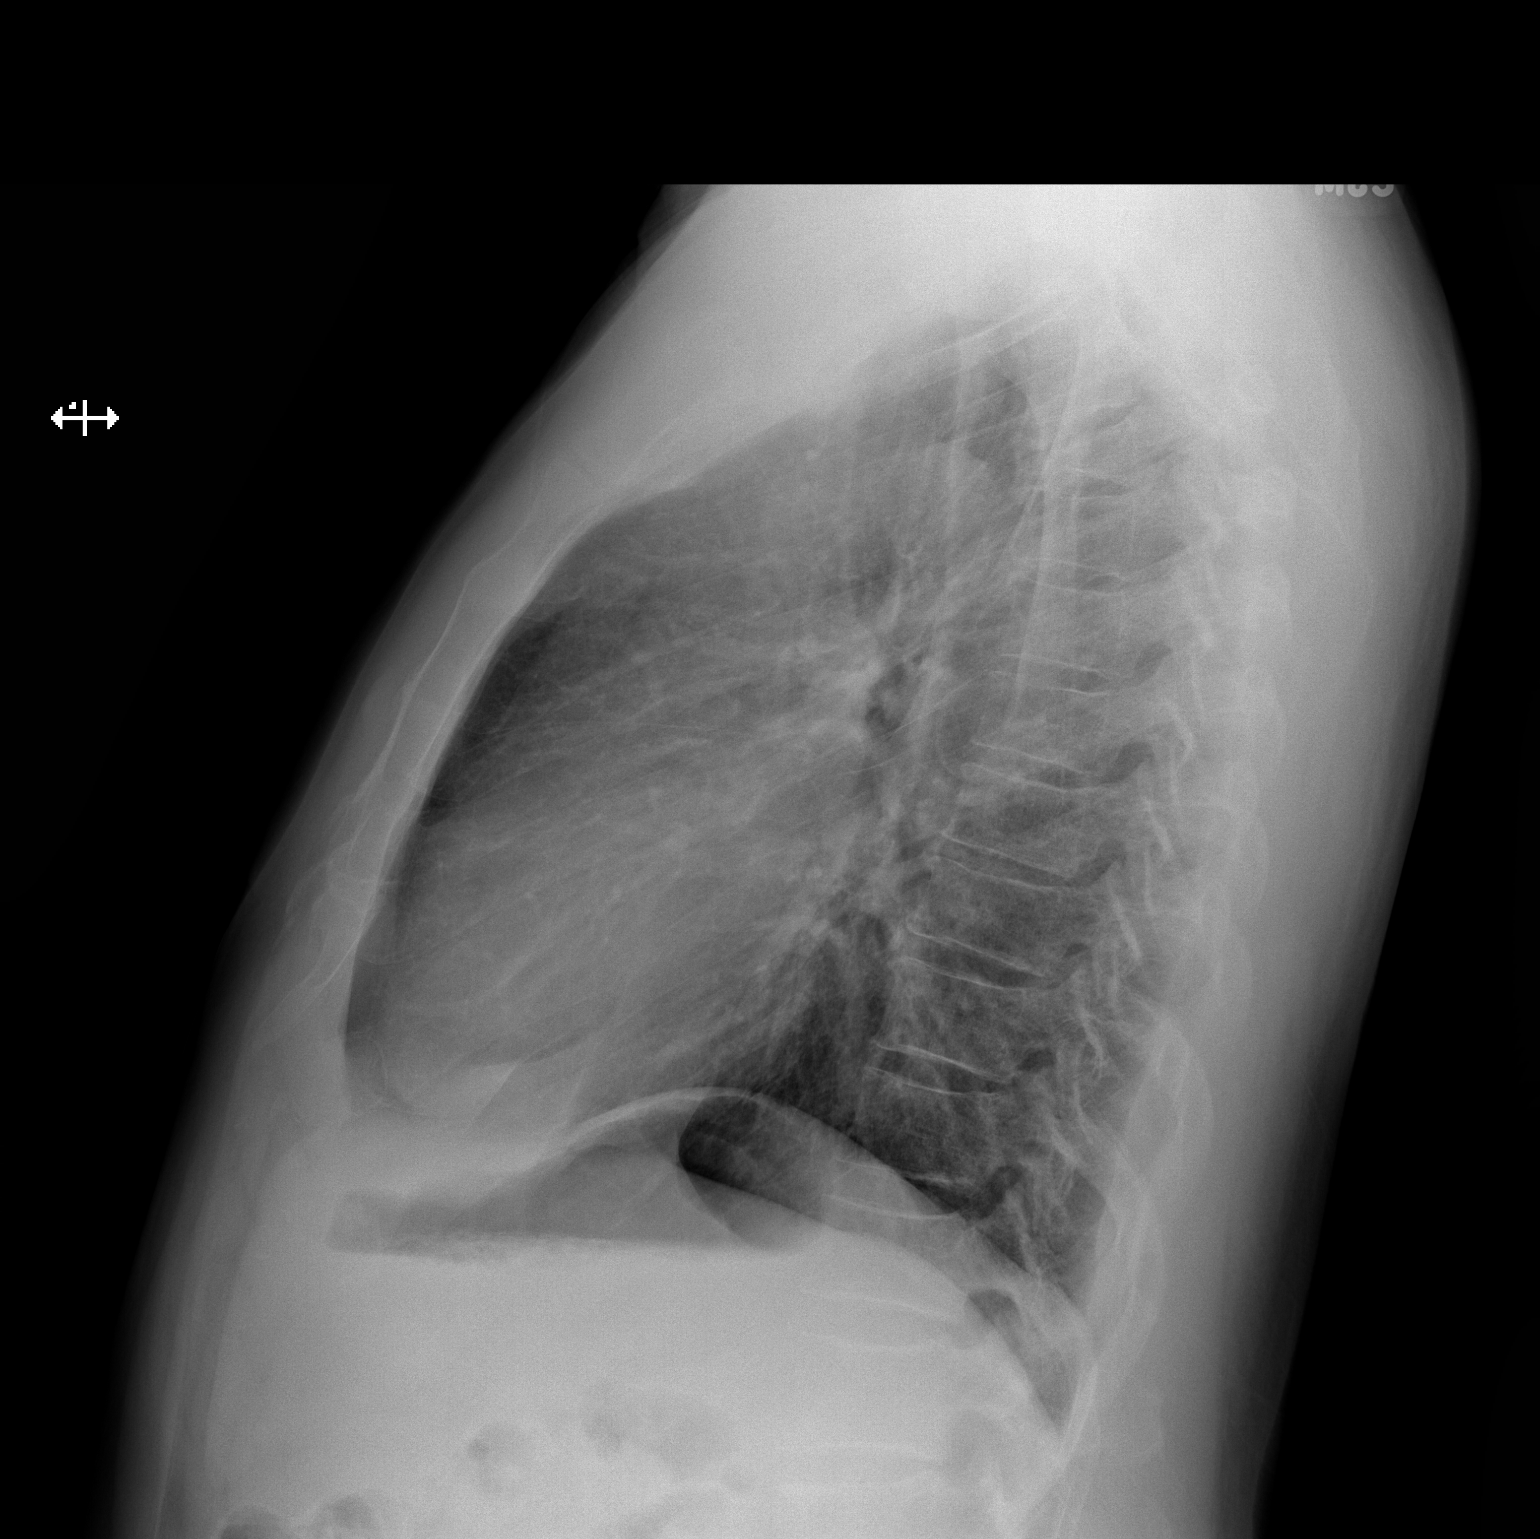

[2 of 2 positions shown; findings below may reference images not displayed]

FINDINGS: The heart size and mediastinal contours are within normal limits.
Both lungs are clear. The visualized skeletal structures are
unremarkable.
IMPRESSION: No acute abnormality of the lungs.

## 2021-05-02 DIAGNOSIS — G4700 Insomnia, unspecified: Secondary | ICD-10-CM | POA: Diagnosis not present

## 2021-05-02 DIAGNOSIS — I10 Essential (primary) hypertension: Secondary | ICD-10-CM | POA: Diagnosis not present

## 2021-05-02 DIAGNOSIS — G894 Chronic pain syndrome: Secondary | ICD-10-CM | POA: Diagnosis not present

## 2021-05-02 DIAGNOSIS — K219 Gastro-esophageal reflux disease without esophagitis: Secondary | ICD-10-CM | POA: Diagnosis not present

## 2021-05-02 DIAGNOSIS — E782 Mixed hyperlipidemia: Secondary | ICD-10-CM | POA: Diagnosis not present

## 2021-05-02 DIAGNOSIS — Z125 Encounter for screening for malignant neoplasm of prostate: Secondary | ICD-10-CM | POA: Diagnosis not present

## 2021-05-02 DIAGNOSIS — F17200 Nicotine dependence, unspecified, uncomplicated: Secondary | ICD-10-CM | POA: Diagnosis not present

## 2021-05-02 DIAGNOSIS — R7303 Prediabetes: Secondary | ICD-10-CM | POA: Diagnosis not present

## 2021-05-27 DIAGNOSIS — M25512 Pain in left shoulder: Secondary | ICD-10-CM | POA: Diagnosis not present

## 2021-07-19 DIAGNOSIS — I10 Essential (primary) hypertension: Secondary | ICD-10-CM | POA: Diagnosis not present

## 2021-07-19 DIAGNOSIS — G4700 Insomnia, unspecified: Secondary | ICD-10-CM | POA: Diagnosis not present

## 2021-07-19 DIAGNOSIS — G894 Chronic pain syndrome: Secondary | ICD-10-CM | POA: Diagnosis not present

## 2021-07-19 DIAGNOSIS — F17200 Nicotine dependence, unspecified, uncomplicated: Secondary | ICD-10-CM | POA: Diagnosis not present

## 2021-07-19 DIAGNOSIS — R7303 Prediabetes: Secondary | ICD-10-CM | POA: Diagnosis not present

## 2021-07-19 DIAGNOSIS — E782 Mixed hyperlipidemia: Secondary | ICD-10-CM | POA: Diagnosis not present

## 2021-11-04 DIAGNOSIS — G894 Chronic pain syndrome: Secondary | ICD-10-CM | POA: Diagnosis not present

## 2021-11-04 DIAGNOSIS — R7303 Prediabetes: Secondary | ICD-10-CM | POA: Diagnosis not present

## 2021-11-04 DIAGNOSIS — F17200 Nicotine dependence, unspecified, uncomplicated: Secondary | ICD-10-CM | POA: Diagnosis not present

## 2021-11-04 DIAGNOSIS — G4700 Insomnia, unspecified: Secondary | ICD-10-CM | POA: Diagnosis not present

## 2021-11-04 DIAGNOSIS — I10 Essential (primary) hypertension: Secondary | ICD-10-CM | POA: Diagnosis not present

## 2021-11-04 DIAGNOSIS — Z23 Encounter for immunization: Secondary | ICD-10-CM | POA: Diagnosis not present

## 2021-11-04 DIAGNOSIS — E782 Mixed hyperlipidemia: Secondary | ICD-10-CM | POA: Diagnosis not present

## 2021-11-04 DIAGNOSIS — N5201 Erectile dysfunction due to arterial insufficiency: Secondary | ICD-10-CM | POA: Diagnosis not present

## 2022-01-31 ENCOUNTER — Emergency Department (HOSPITAL_COMMUNITY): Payer: Medicare HMO

## 2022-01-31 ENCOUNTER — Emergency Department (HOSPITAL_COMMUNITY)
Admission: EM | Admit: 2022-01-31 | Discharge: 2022-01-31 | Disposition: A | Discharge status: Home / Self Care | Payer: Medicare HMO | Attending: Emergency Medicine | Admitting: Emergency Medicine

## 2022-01-31 ENCOUNTER — Encounter (HOSPITAL_COMMUNITY): Payer: Self-pay

## 2022-01-31 DIAGNOSIS — Z7982 Long term (current) use of aspirin: Secondary | ICD-10-CM | POA: Diagnosis not present

## 2022-01-31 DIAGNOSIS — M7989 Other specified soft tissue disorders: Secondary | ICD-10-CM | POA: Diagnosis not present

## 2022-01-31 DIAGNOSIS — S61213A Laceration without foreign body of left middle finger without damage to nail, initial encounter: Secondary | ICD-10-CM | POA: Diagnosis not present

## 2022-01-31 DIAGNOSIS — W268XXA Contact with other sharp object(s), not elsewhere classified, initial encounter: Secondary | ICD-10-CM | POA: Insufficient documentation

## 2022-01-31 DIAGNOSIS — S6992XA Unspecified injury of left wrist, hand and finger(s), initial encounter: Secondary | ICD-10-CM | POA: Diagnosis present

## 2022-01-31 MED ORDER — CEPHALEXIN 500 MG PO CAPS: 500.0000 mg | ORAL_CAPSULE | Freq: Four times a day (QID) | ORAL | 0 refills | Status: AC

## 2022-01-31 MED ORDER — LIDOCAINE HCL (PF) 1 % IJ SOLN
10.0000 mL | Freq: Once | INTRAMUSCULAR | Status: AC
  Administered 2022-01-31: 10 mL
  Filled 2022-01-31: qty 30

## 2022-01-31 NOTE — ED Provider Notes (Cosign Needed Addendum)
[place] Provider Note   CSN: [id number] Arrival date & time: 01/31/22  7829     History  Chief Complaint  Patient presents with   Extremity Laceration    Samuel Gibson is a 67 y.o. male who presents emergency department with concerns for left middle finger laceration onset prior to arrival.  Patient notes that one of his dog tripped him and he believes he cut his finger on a piece of sheet metal.  No anticoagulant use.  Tetanus is up-to-date.  No swelling.  The history is provided by the patient. No language interpreter was used.       Home Medications Prior to Admission medications   Medication Sig Start Date End Date Taking? Authorizing Provider  cephALEXin (KEFLEX) 500 MG capsule Take 1 capsule (500 mg total) by mouth 4 (four) times daily. 01/31/22  Yes Orlandis Sanden A, PA-C  aspirin EC 81 MG tablet Take 1 tablet (81 mg total) by mouth daily. For 30 days for dvt prophylaxis Patient not taking: Reported on 01/13/2019 01/29/16   Prudencio Burly III, PA-C  doxycycline (VIBRAMYCIN) 100 MG capsule Take 1 capsule (100 mg total) by mouth 2 (two) times daily. 12/12/19   Fransico Meadow, PA-C  ibuprofen (ADVIL) 800 MG tablet Take 800 mg by mouth every 8 (eight) hours as needed for moderate pain.    [provider]  methocarbamol (ROBAXIN) 500 MG tablet Take 1 tablet (500 mg total) by mouth every 6 (six) hours as needed for muscle spasms. 01/26/19   Eustace Moore, MD  metoprolol succinate (TOPROL-XL) 50 MG 24 hr tablet Take 50 mg by mouth at bedtime. Take with or immediately following a meal.     [provider]  ondansetron (ZOFRAN ODT) 4 MG disintegrating tablet Take 1 tablet (4 mg total) by mouth every 8 (eight) hours as needed for nausea or vomiting. Patient not taking: Reported on 07/22/2018 03/24/18   Nuala Alpha A, PA-C  ondansetron (ZOFRAN) 4 MG tablet Take 1 tablet (4 mg total) by mouth every 8 (eight) hours  as needed for nausea or vomiting. Patient not taking: Reported on 07/22/2018 03/16/18   Mesner, Corene Cornea, MD  Oxycodone HCl 20 MG TABS Take 20 mg by mouth every 6 (six) hours as needed (pain).     [provider]  polyethylene glycol (MIRALAX / GLYCOLAX) 17 g packet Take 17 g by mouth daily. 01/28/19   Drenda Freeze, MD  rosuvastatin (CRESTOR) 10 MG tablet Take 10 mg by mouth at bedtime. 12/20/18   [provider]  tamsulosin (FLOMAX) 0.4 MG CAPS capsule Take 1 capsule (0.4 mg total) by mouth daily. 01/26/19   Eustace Moore, MD      Allergies    Patient has no known allergies.    Review of Systems   Review of Systems  All other systems reviewed and are negative.   Physical Exam Updated Vital Signs BP 124/75 (BP Location: Left Arm)   Pulse 77   Temp 98.2 F (36.8 C) (Oral)   Resp 18   SpO2 98%  Physical Exam Vitals and nursing note reviewed.  Constitutional:      General: He is not in acute distress.    Appearance: Normal appearance. He is not ill-appearing.  HENT:     Head: Normocephalic and atraumatic.     Right Ear: External ear normal.     Left Ear: External ear normal.  Eyes:  General: No scleral icterus. Cardiovascular:     Rate and Rhythm: Normal rate.  Pulmonary:     Effort: Pulmonary effort is normal.  Musculoskeletal:        General: Normal range of motion.     Cervical back: Normal range of motion and neck supple.     Comments: Able to flex and extend left middle finger against resistance.  Capillary refill less than 2 seconds.  Neurovascular intact.  Skin:    General: Skin is warm and dry.     Capillary Refill: Capillary refill takes less than 2 seconds.     Findings: Laceration present.     Comments: 3 cm laceration noted to lateral aspect of left middle finger at PIP joint.  Neurological:     Mental Status: He is alert.     ED Results / Procedures / Treatments   Labs (all labs ordered are listed, but only abnormal results are  displayed) Labs Reviewed - No data to display  EKG None  Radiology DG Hand Complete Left  Result Date: 01/31/2022 CLINICAL DATA:  left middle finger lac EXAM: LEFT HAND - COMPLETE 3+ VIEW COMPARISON:  None Available. FINDINGS: There is no evidence of fracture or dislocation. Moderate osteoarthritis. Mild suspected soft tissue swelling and possible small amount a gas due to laceration bulging the middle finger. IMPRESSION: No evidence of acute fracture or joint dislocation. Electronically Signed   By: Feliberto Harts M.D.   On: 01/31/2022 12:32    Procedures .Marland KitchenLaceration Repair  Date/Time: 01/31/2022 1:00 PM  Performed by: Chestine Spore A, PA-C Authorized by: Karenann Cai, PA-C   Consent:    Consent obtained:  Verbal   Consent given by:  Patient   Risks discussed:  Infection, pain and need for additional repair Universal protocol:    Imaging studies available: yes     Patient identity confirmed:  Verbally with patient and hospital-assigned identification number Anesthesia:    Anesthesia method:  Local infiltration   Local anesthetic:  Lidocaine 1% w/o epi Laceration details:    Location:  Finger   Finger location:  L long finger   Length (cm):  3 Pre-procedure details:    Preparation:  Patient was prepped and draped in usual sterile fashion and imaging obtained to evaluate for foreign bodies Exploration:    Hemostasis achieved with:  Direct pressure   Imaging obtained: x-ray     Imaging outcome: foreign body not noted     Wound exploration: entire depth of wound visualized   Treatment:    Area cleansed with:  Saline   Amount of cleaning:  Standard   Irrigation solution:  Sterile saline   Irrigation method:  Syringe Skin repair:    Repair method:  Sutures   Suture size:  5-0   Suture material:  Prolene   Suture technique:  Simple interrupted   Number of sutures:  6 Approximation:    Approximation:  Close Repair type:    Repair type:  Simple Post-procedure  details:    Dressing:  Non-adherent dressing, splint for protection and sterile dressing   Procedure completion:  Tolerated well, no immediate complications     Medications Ordered in ED Medications  lidocaine (PF) (XYLOCAINE) 1 % injection 10 mL (has no administration in time range)    ED Course/ Medical Decision Making/ A&P                             Medical  Decision Making Amount and/or Complexity of Data Reviewed Radiology: ordered.  Risk Prescription drug management.   Patient presents with laceration noted PTA. Pt is not on anticoagulants at this time. Vital signs, afebrile. On exam, patient with Able to flex and extend left middle finger against resistance.  Capillary refill less than 2 seconds.  Neurovascular intact. 3 cm laceration noted to lateral aspect of left middle finger. Tetanus UTD, 12/15/2019. Laceration occurred < 12 hours prior to repair. Differential diagnosis includes, fracture, foreign body, dislocation, avulsion.   Imaging: I ordered imaging studies including left hand xray  I independently visualized and interpreted imaging which showed: no acute findings I agree with the radiologist interpretation   Disposition: Presentation suspicious for laceration. Doubt fracture, dislocation, or foreign body at this time. Tetanus up-to-date. Wound thoroughly irrigated, no foreign bodies noted. Laceration repaired in the ED today. Pt provided with finger splint in the ED. After consideration of the diagnostic results and the patients response to treatment, I feel that the patient would benefit from Discharge home. Discussed laceration care with pt and answered questions. Pt to follow up for suture/staple removal in 7-10 days and wound check sooner should there be signs of dehiscence or infection.  Patient provided with information for hand specialist for follow-up.  Prescription for Keflex sent.  Pt is hemodynamically stable with no complaints prior to discharge.  Supportive care measures and strict return precautions discussed with patient at bedside. Pt acknowledges and verbalizes understanding. Pt appears safe for discharge. Follow up as indicated in discharge paperwork.    This chart was dictated using voice recognition software, Dragon. Despite the best efforts of this provider to proofread and correct errors, errors may still occur which can change documentation meaning.   Final Clinical Impression(s) / ED Diagnoses Final diagnoses:  Laceration of left middle finger without foreign body without damage to nail, initial encounter    Rx / DC Orders ED Discharge Orders          Ordered    cephALEXin (KEFLEX) 500 MG capsule  4 times daily        01/31/22 1319              Isamu Trammel A, PA-C 01/31/22 1328    Sairah Knobloch A, PA-C 01/31/22 2117    Wynona Dove A, DO 02/02/22 1140

## 2022-01-31 NOTE — ED Triage Notes (Signed)
Pt presents with c/o left middle finger laceration. Pt reports his dogs tripped him and he caught himself on some sheet metal, cutting his finger. Bleeding controlled at this time.

## 2022-01-31 NOTE — Discharge Instructions (Addendum)
It was a pleasure taking care of you today!   You will be sent a prescription for keflex (antibiotic), take as directed. Attached is information for the on-call hand specialist, call and set up a follow up appointment. You may return to urgent care or return to the emergency department for suture removal in 7-10 days.  Keep the area clean and dry.  Return to the emergency department if worsening or persistent pain, drainage of wound, increased swelling, or color change to area.

## 2022-02-19 DIAGNOSIS — M25562 Pain in left knee: Secondary | ICD-10-CM | POA: Diagnosis not present

## 2022-02-19 DIAGNOSIS — M545 Low back pain, unspecified: Secondary | ICD-10-CM | POA: Diagnosis not present

## 2022-02-24 DIAGNOSIS — M25562 Pain in left knee: Secondary | ICD-10-CM | POA: Diagnosis not present

## 2022-02-26 ENCOUNTER — Encounter (HOSPITAL_COMMUNITY): Payer: Self-pay

## 2022-02-26 ENCOUNTER — Emergency Department (HOSPITAL_COMMUNITY): Payer: Medicare HMO

## 2022-02-26 ENCOUNTER — Other Ambulatory Visit: Payer: Self-pay

## 2022-02-26 ENCOUNTER — Emergency Department (HOSPITAL_COMMUNITY)
Admission: EM | Admit: 2022-02-26 | Discharge: 2022-02-26 | Disposition: A | Discharge status: Home / Self Care | Payer: Medicare HMO | Attending: Emergency Medicine | Admitting: Emergency Medicine

## 2022-02-26 ENCOUNTER — Other Ambulatory Visit (HOSPITAL_COMMUNITY): Payer: Medicare HMO

## 2022-02-26 DIAGNOSIS — Z79899 Other long term (current) drug therapy: Secondary | ICD-10-CM | POA: Insufficient documentation

## 2022-02-26 DIAGNOSIS — M25562 Pain in left knee: Secondary | ICD-10-CM | POA: Insufficient documentation

## 2022-02-26 DIAGNOSIS — I10 Essential (primary) hypertension: Secondary | ICD-10-CM | POA: Insufficient documentation

## 2022-02-26 DIAGNOSIS — Z96612 Presence of left artificial shoulder joint: Secondary | ICD-10-CM | POA: Insufficient documentation

## 2022-02-26 DIAGNOSIS — W19XXXA Unspecified fall, initial encounter: Secondary | ICD-10-CM | POA: Diagnosis not present

## 2022-02-26 DIAGNOSIS — S80919A Unspecified superficial injury of unspecified knee, initial encounter: Secondary | ICD-10-CM | POA: Diagnosis not present

## 2022-02-26 MED ORDER — DEXAMETHASONE SODIUM PHOSPHATE 10 MG/ML IJ SOLN
10.0000 mg | Freq: Once | INTRAMUSCULAR | Status: AC
  Administered 2022-02-26: 10 mg via INTRAVENOUS
  Filled 2022-02-26: qty 1

## 2022-02-26 MED ORDER — FENTANYL CITRATE PF 50 MCG/ML IJ SOSY
25.0000 ug | PREFILLED_SYRINGE | Freq: Once | INTRAMUSCULAR | Status: AC
  Administered 2022-02-26: 25 ug via INTRAMUSCULAR
  Filled 2022-02-26: qty 1

## 2022-02-26 MED ORDER — OXYCODONE-ACETAMINOPHEN 5-325 MG PO TABS: 1.0000 | ORAL_TABLET | Freq: Four times a day (QID) | ORAL | 0 refills | Status: AC | PRN

## 2022-02-26 MED ORDER — CYCLOBENZAPRINE HCL 10 MG PO TABS
10.0000 mg | ORAL_TABLET | Freq: Once | ORAL | Status: AC
  Administered 2022-02-26: 10 mg via ORAL
  Filled 2022-02-26: qty 1

## 2022-02-26 MED ORDER — KETOROLAC TROMETHAMINE 30 MG/ML IJ SOLN
15.0000 mg | Freq: Once | INTRAMUSCULAR | Status: AC
  Administered 2022-02-26: 15 mg via INTRAVENOUS
  Filled 2022-02-26: qty 1

## 2022-02-26 MED ORDER — HYDROMORPHONE HCL 1 MG/ML IJ SOLN
1.0000 mg | Freq: Once | INTRAMUSCULAR | Status: AC
  Administered 2022-02-26: 1 mg via INTRAVENOUS
  Filled 2022-02-26: qty 1

## 2022-02-26 MED ORDER — LIDOCAINE 5 % EX PTCH
1.0000 | MEDICATED_PATCH | CUTANEOUS | Status: DC
  Filled 2022-02-26: qty 1

## 2022-02-26 NOTE — Discharge Instructions (Signed)
Please take your medications as prescribed. Take tylenol/ibuprofen or Percocet as needed for pain. I recommend close follow-up with PCP for reevaluation.  Please do not hesitate to return to emergency department if worrisome signs symptoms we discussed become apparent.

## 2022-02-26 NOTE — ED Triage Notes (Signed)
BIBA mechanical fall with left knee pain Knee brace in place in triage, pt reports wearing it prior to fall MRI on monday for left knee for ongoing knee pain.  Denies hitting head, denies blood thinner usage.  Tylenol 662m given by EMS

## 2022-02-26 NOTE — ED Provider Notes (Signed)
[facility] Provider Note   CSN: [id number] Arrival date & time: 02/26/22  1351     History  Chief Complaint  Patient presents with   Knee Pain    Samuel Gibson is a 67 y.o. male with a past medical history of anxiety, BPH, kidney stones, hypertension presenting today for evaluation of knee pain.  Patient was brought here by EMS after a fall.  Patient states "I just fell", denied tripping or having dizziness before the fall.  Patient got an MRI of the left knee on Monday due to knee pain and had a brace on before the fall however he has not got the results from his MRI.  He denies hitting his head or LOC.   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 Authorizing Provider  oxyCODONE-acetaminophen (PERCOCET/ROXICET) 5-325 MG tablet Take 1 tablet by mouth every 6 (six) hours as needed for severe pain. 02/26/22  Yes Rex Kras, PA  aspirin EC 81 MG tablet Take 1 tablet (81 mg total) by mouth daily. For 30 days for dvt prophylaxis Patient not taking: Reported on 01/13/2019 01/29/16   Prudencio Burly III, PA-C  cephALEXin (KEFLEX) 500 MG capsule Take 1 capsule (500 mg total) by mouth 4 (four) times daily. 01/31/22   Blue, Soijett A, PA-C  doxycycline (VIBRAMYCIN) 100 MG  capsule Take 1 capsule (100 mg total) by mouth 2 (two) times daily. 12/12/19   Fransico Meadow, PA-C  ibuprofen (ADVIL) 800 MG tablet Take 800 mg by mouth every 8 (eight) hours as needed for moderate pain.    [provider]  methocarbamol (ROBAXIN) 500 MG tablet Take 1 tablet (500 mg total) by mouth every 6 (six) hours as needed for muscle spasms. 01/26/19   Eustace Moore, MD  metoprolol succinate (TOPROL-XL) 50 MG 24 hr tablet Take 50 mg by mouth at bedtime. Take with or immediately following a meal.     [provider]  ondansetron (ZOFRAN ODT) 4 MG disintegrating tablet Take 1 tablet (4 mg total) by mouth every 8 (eight) hours as needed for nausea or vomiting. Patient not taking: Reported on 07/22/2018 03/24/18   Nuala Alpha A, PA-C  ondansetron (ZOFRAN) 4 MG tablet Take 1 tablet (4 mg total) by mouth every 8 (eight) hours as needed for nausea or vomiting. Patient not taking: Reported on 07/22/2018 03/16/18   Mesner, Corene Cornea, MD  Oxycodone HCl 20 MG TABS Take 20 mg by mouth every 6 (six) hours as needed (pain).     [provider]  polyethylene glycol (MIRALAX / GLYCOLAX) 17 g packet Take 17 g by mouth daily. 01/28/19   Drenda Freeze, MD  rosuvastatin (CRESTOR) 10 MG tablet Take 10 mg by mouth at bedtime. 12/20/18   [provider]  tamsulosin (FLOMAX) 0.4 MG CAPS capsule Take 1 capsule (0.4 mg total) by mouth daily. 01/26/19   Eustace Moore, MD      Allergies    Patient has no known allergies.    Review of Systems   Review of Systems Negative except as per HPI.  Physical Exam Updated Vital Signs BP 136/71   Pulse 72   Temp 98 F (36.7 C) (Oral)   Resp 11   Ht '5\' 10"'$  (1.778 m)   Wt 81.6 kg   SpO2 99%   BMI 25.83 kg/m  Physical Exam Vitals and nursing note reviewed.  Constitutional:      Appearance: Normal appearance.  HENT:     Head: Normocephalic and atraumatic.     Mouth/Throat:     Mouth: Mucous membranes are moist.  Eyes:      General: No scleral icterus. Cardiovascular:     Rate and Rhythm: Normal rate and regular rhythm.     Pulses: Normal pulses.     Heart sounds: Normal heart sounds.  Pulmonary:     Effort: Pulmonary effort is normal.     Breath sounds: Normal breath sounds.  Abdominal:     General: Abdomen is flat.     Palpations: Abdomen is soft.     Tenderness: There is no abdominal tenderness.  Musculoskeletal:        General: No deformity.     Comments: Patient has significant pain with light touch of the L knee. Limited passive ROM due to pain.  Skin:    General: Skin is warm.     Findings: No rash.  Neurological:     General: No focal deficit present.     Mental Status: He is alert.  Psychiatric:        Mood and Affect: Mood normal.     ED Results / Procedures / Treatments   Labs (all labs ordered are listed, but only abnormal results are displayed) Labs Reviewed - No data to display  EKG None  Radiology CT Knee Left Wo Contrast  Result Date: 02/26/2022 CLINICAL DATA:  knee pain.  Fall EXAM: CT OF THE LEFT KNEE WITHOUT CONTRAST TECHNIQUE: Multidetector CT imaging of the left knee was performed according to the standard protocol. Multiplanar CT image reconstructions were also generated. RADIATION DOSE REDUCTION: This exam was performed according to the departmental dose-optimization program which includes automated exposure control, adjustment of the mA and/or kV according to patient size and/or use of iterative reconstruction technique. COMPARISON:  X-ray bilateral knee 02/19/2022, MRI left knee 02/24/2022 FINDINGS: Bones/Joint/Cartilage No evidence of fracture or dislocation. Known subchondral insufficiency fracture of the lateral femoral condyle better evaluated on MRI left knee 02/24/2022. Small joint effusion. Mild tricompartmental degenerative changes of the knee. No aggressive appearing focal bone abnormality. Ligaments Suboptimally assessed by CT. Muscles and Tendons Grossly  unremarkable. Soft tissues Unremarkable. Tiny Baker's cyst better evaluated on MRI. Mild vascular calcifications. IMPRESSION: 1. No acute displaced fracture or dislocation. 2. Known subchondral insufficiency fracture of the lateral femoral condyle better evaluated on MRI left knee 02/24/2022. 3. Small joint effusion. Electronically Signed   By: Iven Finn M.D.   On: 02/26/2022 19:36    Procedures Procedures    Medications Ordered in ED Medications  ketorolac (TORADOL) 30 MG/ML injection 15 mg (has no administration in time range)  lidocaine (LIDODERM) 5 % 1 patch (has no administration  in time range)  dexamethasone (DECADRON) injection 10 mg (has no administration in time range)  fentaNYL (SUBLIMAZE) injection 25 mcg (25 mcg Intramuscular Given 02/26/22 1533)  fentaNYL (SUBLIMAZE) injection 25 mcg (25 mcg Intramuscular Given 02/26/22 1727)  cyclobenzaprine (FLEXERIL) tablet 10 mg (10 mg Oral Given 02/26/22 1734)  HYDROmorphone (DILAUDID) injection 1 mg (1 mg Intravenous Given 02/26/22 1851)    ED Course/ Medical Decision Making/ A&P                             Medical Decision Making Amount and/or Complexity of Data Reviewed Radiology: ordered.  Risk Prescription drug management.   This patient presents to the ED for L knee pain, this involves an extensive number of treatment options, and is a complaint that carries with a high risk of complications and morbidity.  The differential diagnosis includes fracture, dislocation, septic joint, DVT.  This is not an exhaustive list.  Imaging studies: I ordered imaging studies. I personally reviewed, interpreted imaging and agree with the radiologist's interpretations. The results include: CT of the left knee showed no acute fracture or dislocation.  Problem list/ ED course/ Critical interventions/ Medical management: HPI: See above Vital signs within normal range and stable throughout visit. Laboratory/imaging studies significant for:  See above. On physical examination, patient is afebrile and appears in no acute distress. Patient presents with knee joint pain. Given history, exam and workup patient likely has old. I have low suspicion for fracture, dislocation, significant ligamentous injury, septic arthritis, gout flare, new autoimmune arthropathy, or gonococcal arthropathy.  Pain control has been aggressively done here including fentanyl, Dilaudid, Flexeril, Toradol, lidocaine patch.  Hospital admission is considered for pain control however patient would like to go home and follow-up with Dr. Noemi Chapel in the morning.  I do think this is reasonable following Dr. Archie Endo recommendations.  I will send an Rx of Percocet for pain control.  Advised patient to take Tylenol/ibuprofen or Percocet as needed for pain and follow-up with orthopedics tomorrow, return to the ER if new or worsening symptoms.  I have reviewed the patient home medicines and have made adjustments as needed.  Cardiac monitoring/EKG: The patient was maintained on a cardiac monitor.  I personally reviewed and interpreted the cardiac monitor which showed an underlying rhythm of: sinus rhythm.  Additional history obtained: External records from outside source obtained and reviewed including: Chart review including previous notes, labs, imaging.  Consultations obtained: I requested consultation with Dr. Noemi Chapel, and discussed lab and imaging findings as well as pertinent plan.  He recommended: Toradol, Decadron, lidocaine patch and outpatient follow-up with Dr. Noemi Chapel in the morning. If pain is unbearable patient should be admitted and will be seen in the morning.  Disposition Continued outpatient therapy. Follow-up with Dr. Noemi Chapel orthopedics recommended for reevaluation of symptoms. Treatment plan discussed with patient.  Pt acknowledged understanding was agreeable to the plan. Worrisome signs and symptoms were discussed with patient, and patient acknowledged  understanding to return to the ED if they noticed these signs and symptoms. Patient was stable upon discharge.   This chart was dictated using voice recognition software.  Despite best efforts to proofread,  errors can occur which can change the documentation meaning.          Final Clinical Impression(s) / ED Diagnoses Final diagnoses:  Acute pain of left knee    Rx / DC Orders ED Discharge Orders  Ordered    oxyCODONE-acetaminophen (PERCOCET/ROXICET) 5-325 MG tablet  Every 6 hours PRN        02/26/22 2125              Rex Kras, [place] 02/27/22 0027    Drenda Freeze, MD 03/03/22 [phone]

## 2022-02-28 IMAGING — CR DG HAND COMPLETE 3+V*R*
3 series · 3 of 3 positions shown · non-contrast
Comparison: None.

CLINICAL DATA: Right hand swelling

EXAM:
RIGHT HAND - COMPLETE 3+ VIEW

[x hand pa right]
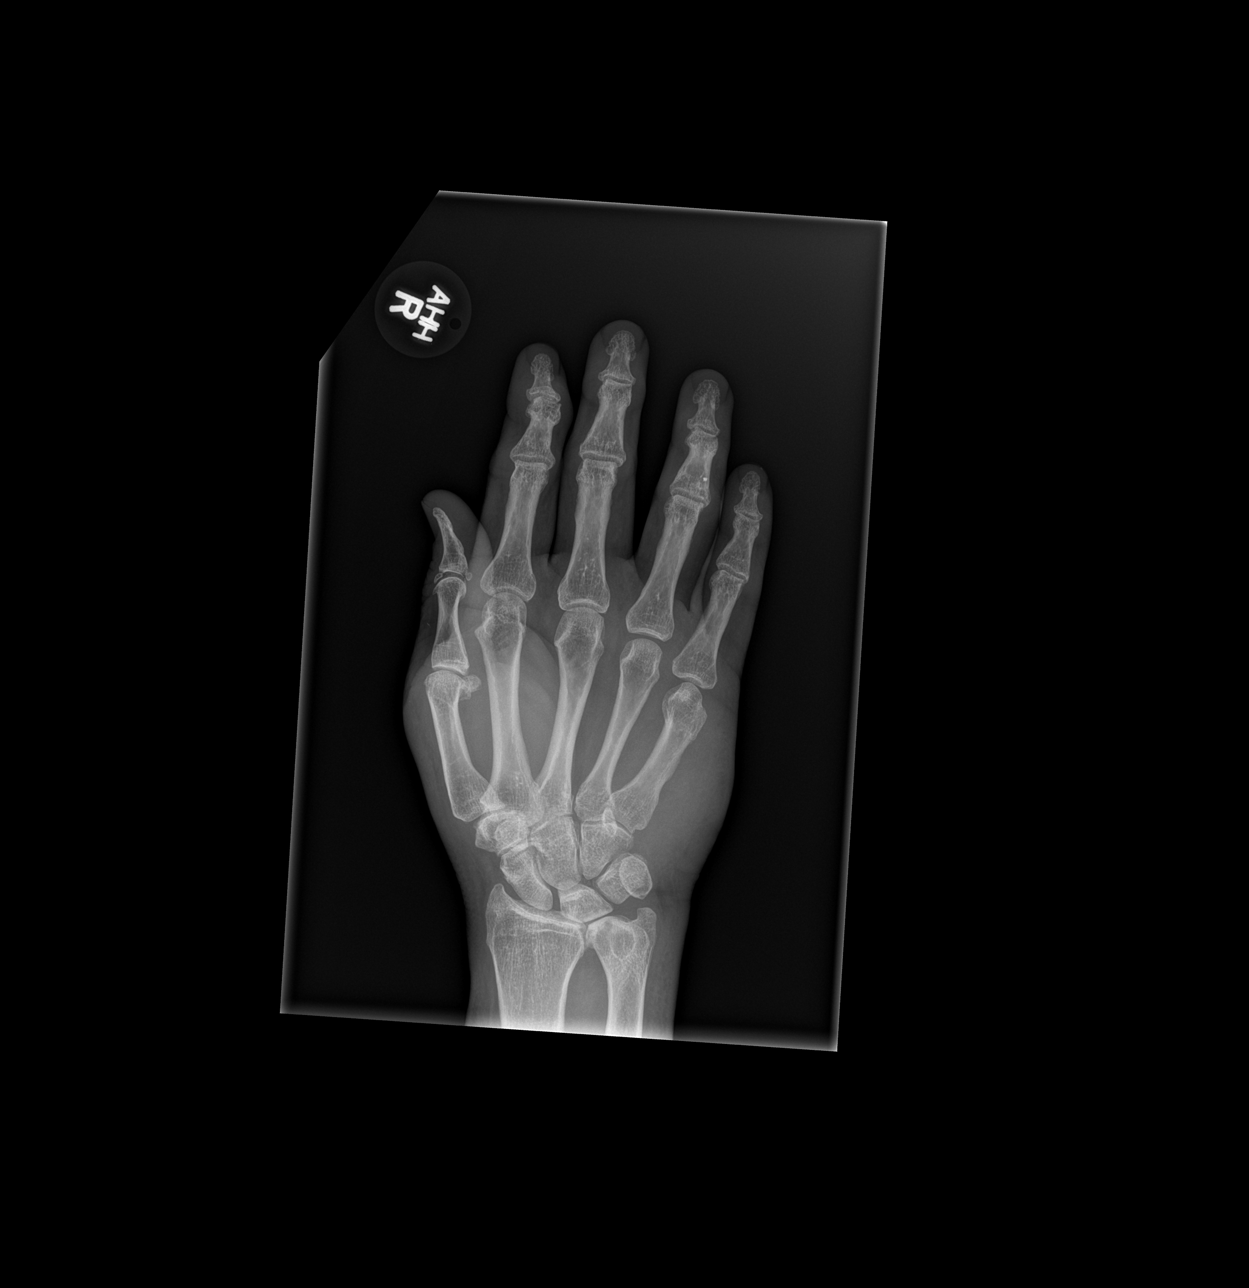

[x hand obl right]
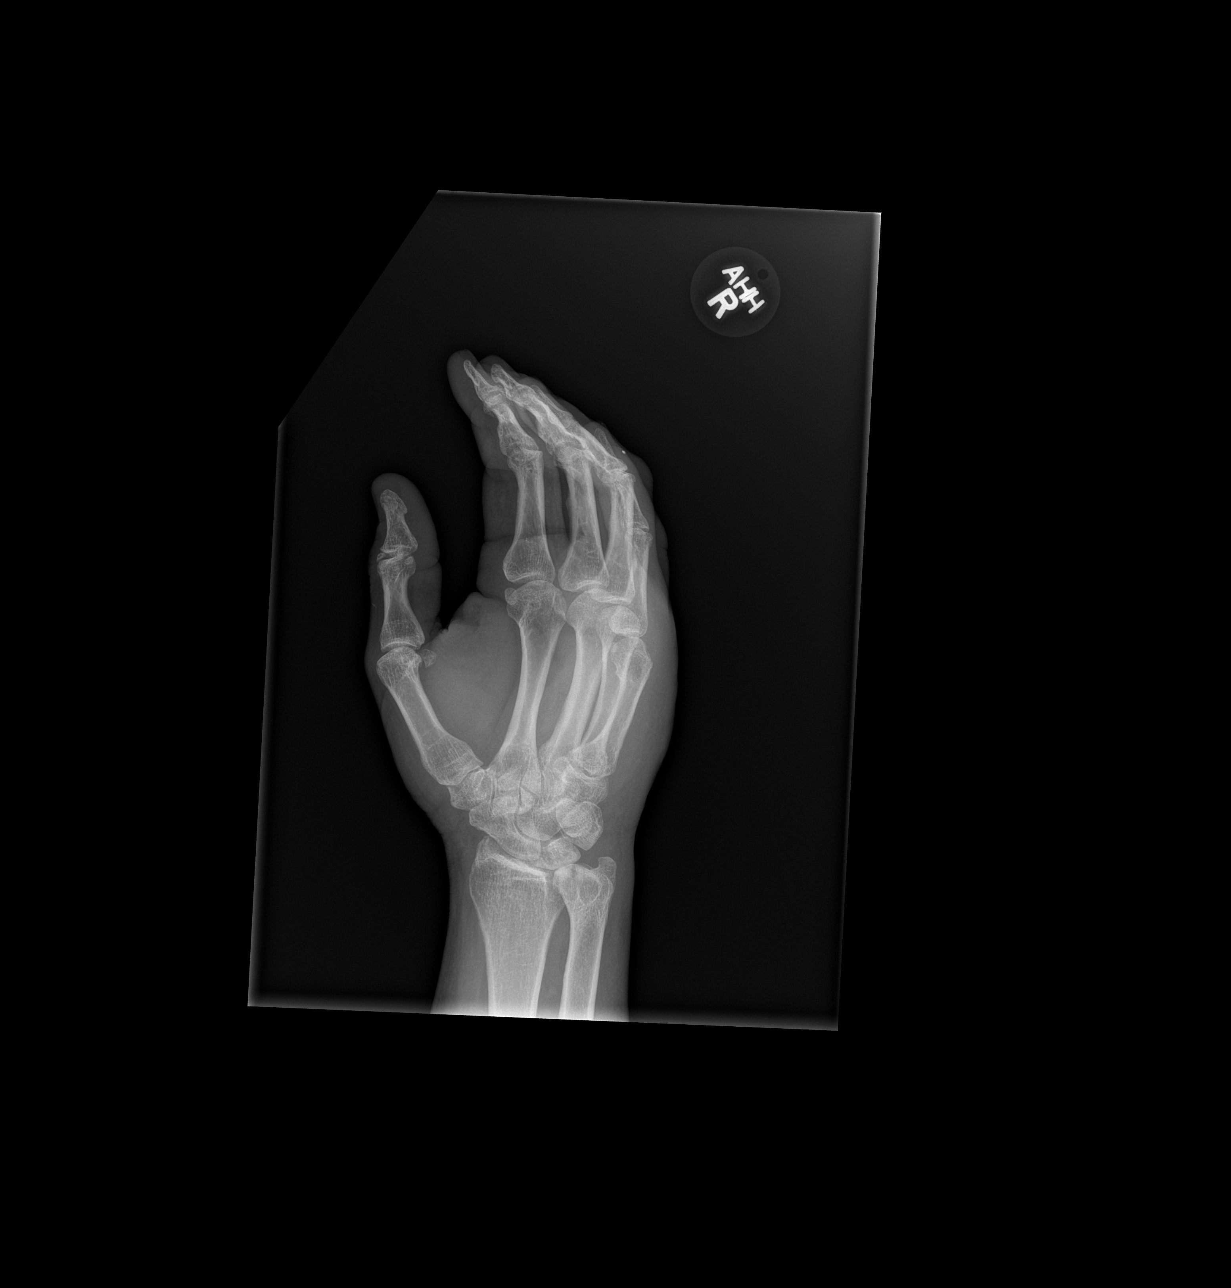

[x hand lat right]
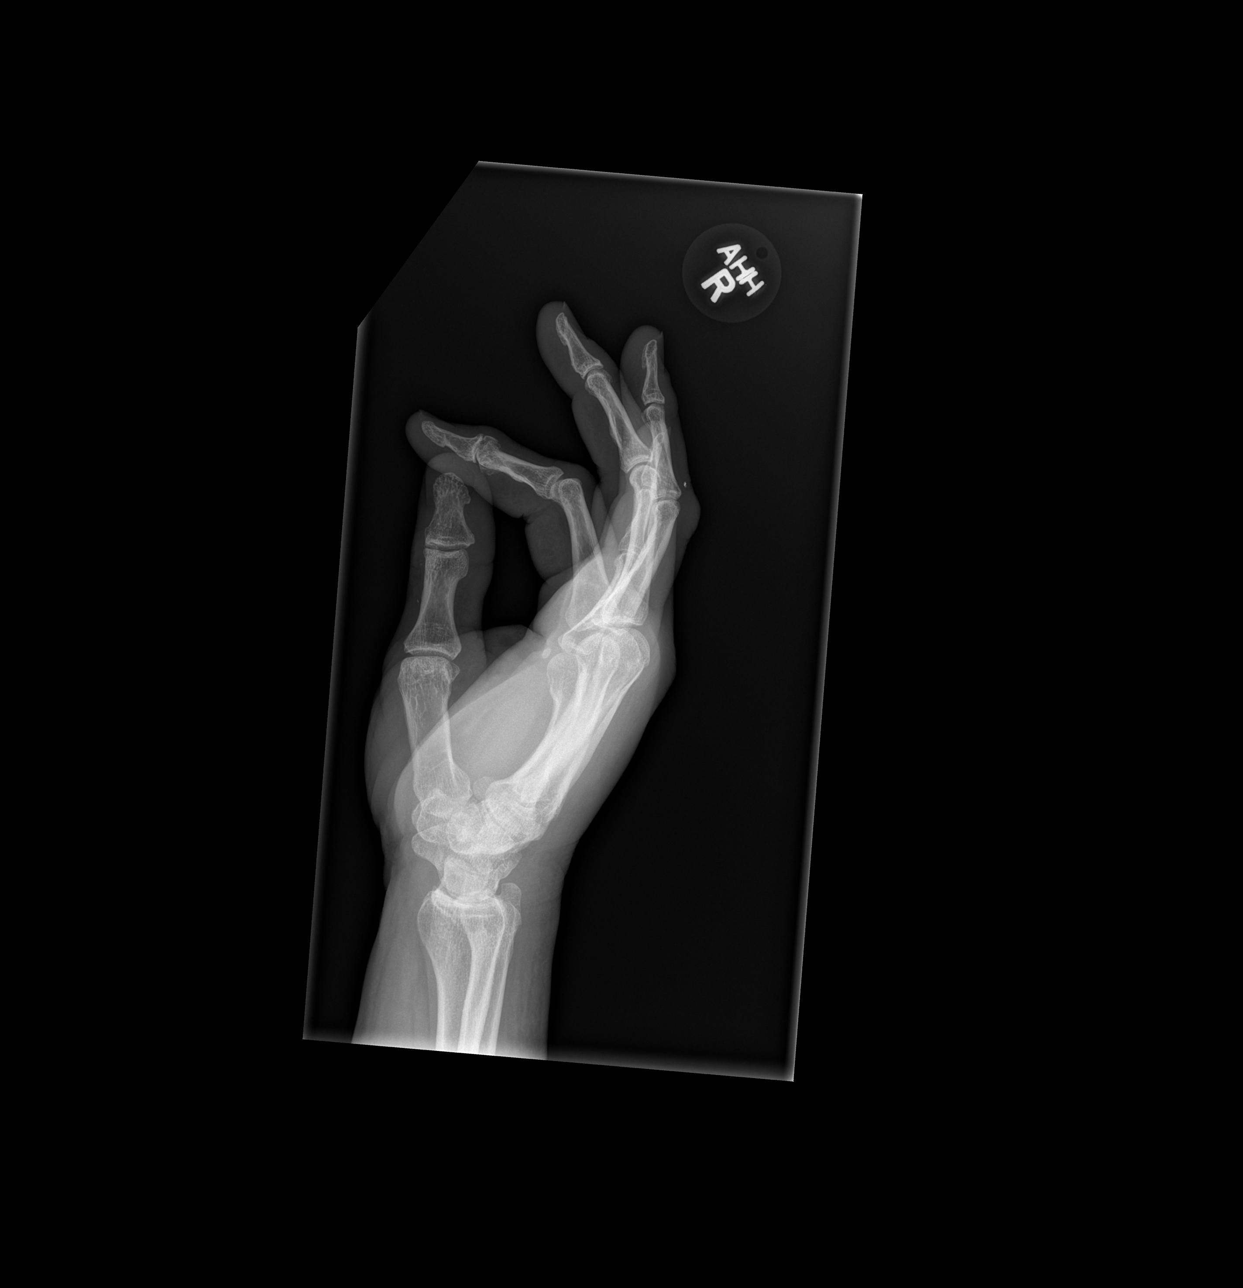

[3 of 3 positions shown; findings below may reference images not displayed]

FINDINGS: No acute fracture. No dislocation. No bony erosion or periosteal
elevation. Degenerative changes of the right hand most pronounced at
the first CMC joint, first MCP joint, and index finger DIP joint.
There is a small 2 mm radiopaque foreign body within the dorsal soft
tissues of the ring finger just distal to the level of the PIP
joint. There is soft tissue swelling of the dorsum of the hand. No
soft tissue gas.
IMPRESSION: 1. No acute osseous abnormality. Soft tissue swelling of the dorsum
of the hand.
2. Small 2 mm radiopaque foreign body within the dorsal soft tissues
of the ring finger just distal to the level of the PIP joint.

## 2022-03-03 DIAGNOSIS — M25562 Pain in left knee: Secondary | ICD-10-CM | POA: Diagnosis not present

## 2022-03-03 DIAGNOSIS — E782 Mixed hyperlipidemia: Secondary | ICD-10-CM | POA: Diagnosis not present

## 2022-03-03 DIAGNOSIS — F17200 Nicotine dependence, unspecified, uncomplicated: Secondary | ICD-10-CM | POA: Diagnosis not present

## 2022-03-03 DIAGNOSIS — M84452D Pathological fracture, left femur, subsequent encounter for fracture with routine healing: Secondary | ICD-10-CM | POA: Diagnosis not present

## 2022-03-03 DIAGNOSIS — R7303 Prediabetes: Secondary | ICD-10-CM | POA: Diagnosis not present

## 2022-03-03 DIAGNOSIS — G894 Chronic pain syndrome: Secondary | ICD-10-CM | POA: Diagnosis not present

## 2022-03-03 DIAGNOSIS — Z79899 Other long term (current) drug therapy: Secondary | ICD-10-CM | POA: Diagnosis not present

## 2022-03-03 DIAGNOSIS — I10 Essential (primary) hypertension: Secondary | ICD-10-CM | POA: Diagnosis not present

## 2022-03-06 DIAGNOSIS — M25552 Pain in left hip: Secondary | ICD-10-CM | POA: Diagnosis not present

## 2022-03-06 DIAGNOSIS — M25562 Pain in left knee: Secondary | ICD-10-CM | POA: Diagnosis not present

## 2022-04-02 DIAGNOSIS — D72829 Elevated white blood cell count, unspecified: Secondary | ICD-10-CM | POA: Diagnosis not present

## 2022-05-06 DIAGNOSIS — E782 Mixed hyperlipidemia: Secondary | ICD-10-CM | POA: Diagnosis not present

## 2022-05-06 DIAGNOSIS — R7303 Prediabetes: Secondary | ICD-10-CM | POA: Diagnosis not present

## 2022-05-06 DIAGNOSIS — G4700 Insomnia, unspecified: Secondary | ICD-10-CM | POA: Diagnosis not present

## 2022-05-06 DIAGNOSIS — Z0000 Encounter for general adult medical examination without abnormal findings: Secondary | ICD-10-CM | POA: Diagnosis not present

## 2022-05-06 DIAGNOSIS — I10 Essential (primary) hypertension: Secondary | ICD-10-CM | POA: Diagnosis not present

## 2022-05-06 DIAGNOSIS — Z23 Encounter for immunization: Secondary | ICD-10-CM | POA: Diagnosis not present

## 2022-05-06 DIAGNOSIS — F17200 Nicotine dependence, unspecified, uncomplicated: Secondary | ICD-10-CM | POA: Diagnosis not present

## 2022-05-06 DIAGNOSIS — Z1211 Encounter for screening for malignant neoplasm of colon: Secondary | ICD-10-CM | POA: Diagnosis not present

## 2022-05-06 DIAGNOSIS — G894 Chronic pain syndrome: Secondary | ICD-10-CM | POA: Diagnosis not present

## 2022-07-15 DIAGNOSIS — D124 Benign neoplasm of descending colon: Secondary | ICD-10-CM | POA: Diagnosis not present

## 2022-07-15 DIAGNOSIS — Z1211 Encounter for screening for malignant neoplasm of colon: Secondary | ICD-10-CM | POA: Diagnosis not present

## 2022-07-15 DIAGNOSIS — K648 Other hemorrhoids: Secondary | ICD-10-CM | POA: Diagnosis not present

## 2022-07-17 DIAGNOSIS — D124 Benign neoplasm of descending colon: Secondary | ICD-10-CM | POA: Diagnosis not present

## 2022-08-08 DIAGNOSIS — I10 Essential (primary) hypertension: Secondary | ICD-10-CM | POA: Diagnosis not present

## 2022-08-08 DIAGNOSIS — R6881 Early satiety: Secondary | ICD-10-CM | POA: Diagnosis not present

## 2022-08-08 DIAGNOSIS — R7303 Prediabetes: Secondary | ICD-10-CM | POA: Diagnosis not present

## 2022-08-08 DIAGNOSIS — G4700 Insomnia, unspecified: Secondary | ICD-10-CM | POA: Diagnosis not present

## 2022-08-08 DIAGNOSIS — G894 Chronic pain syndrome: Secondary | ICD-10-CM | POA: Diagnosis not present

## 2022-08-08 DIAGNOSIS — E782 Mixed hyperlipidemia: Secondary | ICD-10-CM | POA: Diagnosis not present

## 2022-08-08 DIAGNOSIS — R634 Abnormal weight loss: Secondary | ICD-10-CM | POA: Diagnosis not present

## 2022-08-08 DIAGNOSIS — F17200 Nicotine dependence, unspecified, uncomplicated: Secondary | ICD-10-CM | POA: Diagnosis not present

## 2022-08-08 DIAGNOSIS — K219 Gastro-esophageal reflux disease without esophagitis: Secondary | ICD-10-CM | POA: Diagnosis not present

## 2022-08-11 ENCOUNTER — Encounter: Payer: Self-pay | Admitting: Family Medicine

## 2022-08-12 ENCOUNTER — Other Ambulatory Visit: Payer: Self-pay | Admitting: Family Medicine

## 2022-08-12 DIAGNOSIS — F17200 Nicotine dependence, unspecified, uncomplicated: Secondary | ICD-10-CM

## 2022-08-12 DIAGNOSIS — R6881 Early satiety: Secondary | ICD-10-CM

## 2022-08-12 DIAGNOSIS — R634 Abnormal weight loss: Secondary | ICD-10-CM

## 2022-08-20 ENCOUNTER — Encounter: Payer: Self-pay | Admitting: Family Medicine

## 2022-09-01 ENCOUNTER — Ambulatory Visit: Admission: RE | Admit: 2022-09-01 | Payer: Medicare HMO | Source: Ambulatory Visit

## 2022-09-01 DIAGNOSIS — R634 Abnormal weight loss: Secondary | ICD-10-CM

## 2022-09-01 DIAGNOSIS — R6881 Early satiety: Secondary | ICD-10-CM | POA: Diagnosis not present

## 2022-09-01 DIAGNOSIS — F17200 Nicotine dependence, unspecified, uncomplicated: Secondary | ICD-10-CM

## 2022-09-01 MED ORDER — IOPAMIDOL (ISOVUE-300) INJECTION 61%
500.0000 mL | Freq: Once | INTRAVENOUS | Status: AC | PRN
  Administered 2022-09-01: 100 mL via INTRAVENOUS

## 2022-11-07 DIAGNOSIS — E782 Mixed hyperlipidemia: Secondary | ICD-10-CM | POA: Diagnosis not present

## 2022-11-07 DIAGNOSIS — G894 Chronic pain syndrome: Secondary | ICD-10-CM | POA: Diagnosis not present

## 2022-11-07 DIAGNOSIS — G4700 Insomnia, unspecified: Secondary | ICD-10-CM | POA: Diagnosis not present

## 2022-11-07 DIAGNOSIS — Z23 Encounter for immunization: Secondary | ICD-10-CM | POA: Diagnosis not present

## 2022-11-07 DIAGNOSIS — I10 Essential (primary) hypertension: Secondary | ICD-10-CM | POA: Diagnosis not present

## 2022-11-07 DIAGNOSIS — F17200 Nicotine dependence, unspecified, uncomplicated: Secondary | ICD-10-CM | POA: Diagnosis not present

## 2022-11-07 DIAGNOSIS — R7303 Prediabetes: Secondary | ICD-10-CM | POA: Diagnosis not present

## 2023-04-07 DIAGNOSIS — Z8249 Family history of ischemic heart disease and other diseases of the circulatory system: Secondary | ICD-10-CM | POA: Diagnosis not present

## 2023-04-07 DIAGNOSIS — Z809 Family history of malignant neoplasm, unspecified: Secondary | ICD-10-CM | POA: Diagnosis not present

## 2023-04-07 DIAGNOSIS — Z833 Family history of diabetes mellitus: Secondary | ICD-10-CM | POA: Diagnosis not present

## 2023-04-07 DIAGNOSIS — M545 Low back pain, unspecified: Secondary | ICD-10-CM | POA: Diagnosis not present

## 2023-04-07 DIAGNOSIS — G43909 Migraine, unspecified, not intractable, without status migrainosus: Secondary | ICD-10-CM | POA: Diagnosis not present

## 2023-04-07 DIAGNOSIS — Z79891 Long term (current) use of opiate analgesic: Secondary | ICD-10-CM | POA: Diagnosis not present

## 2023-04-07 DIAGNOSIS — E785 Hyperlipidemia, unspecified: Secondary | ICD-10-CM | POA: Diagnosis not present

## 2023-04-07 DIAGNOSIS — I10 Essential (primary) hypertension: Secondary | ICD-10-CM | POA: Diagnosis not present

## 2023-04-07 DIAGNOSIS — K5900 Constipation, unspecified: Secondary | ICD-10-CM | POA: Diagnosis not present

## 2023-05-13 DIAGNOSIS — G894 Chronic pain syndrome: Secondary | ICD-10-CM | POA: Diagnosis not present

## 2023-05-13 DIAGNOSIS — Z125 Encounter for screening for malignant neoplasm of prostate: Secondary | ICD-10-CM | POA: Diagnosis not present

## 2023-05-13 DIAGNOSIS — F17200 Nicotine dependence, unspecified, uncomplicated: Secondary | ICD-10-CM | POA: Diagnosis not present

## 2023-05-13 DIAGNOSIS — I10 Essential (primary) hypertension: Secondary | ICD-10-CM | POA: Diagnosis not present

## 2023-05-13 DIAGNOSIS — G4700 Insomnia, unspecified: Secondary | ICD-10-CM | POA: Diagnosis not present

## 2023-05-13 DIAGNOSIS — Z0000 Encounter for general adult medical examination without abnormal findings: Secondary | ICD-10-CM | POA: Diagnosis not present

## 2023-05-13 DIAGNOSIS — R7303 Prediabetes: Secondary | ICD-10-CM | POA: Diagnosis not present

## 2023-05-13 DIAGNOSIS — E782 Mixed hyperlipidemia: Secondary | ICD-10-CM | POA: Diagnosis not present

## 2023-09-22 DIAGNOSIS — E782 Mixed hyperlipidemia: Secondary | ICD-10-CM | POA: Diagnosis not present

## 2023-09-22 DIAGNOSIS — G4700 Insomnia, unspecified: Secondary | ICD-10-CM | POA: Diagnosis not present

## 2023-09-22 DIAGNOSIS — Z720 Tobacco use: Secondary | ICD-10-CM | POA: Diagnosis not present

## 2023-09-22 DIAGNOSIS — Z23 Encounter for immunization: Secondary | ICD-10-CM | POA: Diagnosis not present

## 2023-09-22 DIAGNOSIS — G894 Chronic pain syndrome: Secondary | ICD-10-CM | POA: Diagnosis not present

## 2023-09-22 DIAGNOSIS — N5201 Erectile dysfunction due to arterial insufficiency: Secondary | ICD-10-CM | POA: Diagnosis not present

## 2023-09-22 DIAGNOSIS — I10 Essential (primary) hypertension: Secondary | ICD-10-CM | POA: Diagnosis not present

## 2023-12-01 DIAGNOSIS — G4700 Insomnia, unspecified: Secondary | ICD-10-CM | POA: Diagnosis not present

## 2023-12-01 DIAGNOSIS — G894 Chronic pain syndrome: Secondary | ICD-10-CM | POA: Diagnosis not present

## 2023-12-01 DIAGNOSIS — I10 Essential (primary) hypertension: Secondary | ICD-10-CM | POA: Diagnosis not present

## 2023-12-01 DIAGNOSIS — R7303 Prediabetes: Secondary | ICD-10-CM | POA: Diagnosis not present

## 2023-12-01 DIAGNOSIS — F17200 Nicotine dependence, unspecified, uncomplicated: Secondary | ICD-10-CM | POA: Diagnosis not present

## 2023-12-01 DIAGNOSIS — E782 Mixed hyperlipidemia: Secondary | ICD-10-CM | POA: Diagnosis not present

## 2023-12-07 ENCOUNTER — Encounter: Payer: Self-pay | Admitting: *Deleted

## 2023-12-07 DIAGNOSIS — M25511 Pain in right shoulder: Secondary | ICD-10-CM | POA: Diagnosis not present

## 2023-12-07 DIAGNOSIS — R7303 Prediabetes: Secondary | ICD-10-CM | POA: Diagnosis not present

## 2023-12-07 DIAGNOSIS — M25512 Pain in left shoulder: Secondary | ICD-10-CM | POA: Diagnosis not present

## 2023-12-07 DIAGNOSIS — E782 Mixed hyperlipidemia: Secondary | ICD-10-CM | POA: Diagnosis not present

## 2023-12-07 DIAGNOSIS — Z79899 Other long term (current) drug therapy: Secondary | ICD-10-CM | POA: Diagnosis not present

## 2023-12-07 DIAGNOSIS — M542 Cervicalgia: Secondary | ICD-10-CM | POA: Diagnosis not present

## 2023-12-07 DIAGNOSIS — M129 Arthropathy, unspecified: Secondary | ICD-10-CM | POA: Diagnosis not present

## 2023-12-07 DIAGNOSIS — R0789 Other chest pain: Secondary | ICD-10-CM | POA: Diagnosis not present

## 2023-12-07 DIAGNOSIS — R030 Elevated blood-pressure reading, without diagnosis of hypertension: Secondary | ICD-10-CM | POA: Diagnosis not present

## 2023-12-07 DIAGNOSIS — R0602 Shortness of breath: Secondary | ICD-10-CM | POA: Diagnosis not present

## 2023-12-07 DIAGNOSIS — E559 Vitamin D deficiency, unspecified: Secondary | ICD-10-CM | POA: Diagnosis not present

## 2023-12-07 DIAGNOSIS — M545 Low back pain, unspecified: Secondary | ICD-10-CM | POA: Diagnosis not present

## 2023-12-08 DIAGNOSIS — M542 Cervicalgia: Secondary | ICD-10-CM | POA: Diagnosis not present

## 2023-12-08 DIAGNOSIS — M545 Low back pain, unspecified: Secondary | ICD-10-CM | POA: Diagnosis not present

## 2023-12-08 DIAGNOSIS — M546 Pain in thoracic spine: Secondary | ICD-10-CM | POA: Diagnosis not present

## 2023-12-11 DIAGNOSIS — M542 Cervicalgia: Secondary | ICD-10-CM | POA: Diagnosis not present

## 2023-12-11 DIAGNOSIS — R7303 Prediabetes: Secondary | ICD-10-CM | POA: Diagnosis not present

## 2023-12-11 DIAGNOSIS — Z79899 Other long term (current) drug therapy: Secondary | ICD-10-CM | POA: Diagnosis not present

## 2023-12-11 DIAGNOSIS — M546 Pain in thoracic spine: Secondary | ICD-10-CM | POA: Diagnosis not present

## 2023-12-30 ENCOUNTER — Encounter: Payer: Self-pay | Admitting: Emergency Medicine

## 2024-01-05 ENCOUNTER — Telehealth: Payer: Self-pay

## 2024-01-05 DIAGNOSIS — Z122 Encounter for screening for malignant neoplasm of respiratory organs: Secondary | ICD-10-CM

## 2024-01-05 DIAGNOSIS — F17210 Nicotine dependence, cigarettes, uncomplicated: Secondary | ICD-10-CM

## 2024-01-05 DIAGNOSIS — Z87891 Personal history of nicotine dependence: Secondary | ICD-10-CM

## 2024-01-05 NOTE — Telephone Encounter (Addendum)
 Lung Cancer Screening Narrative/Criteria Questionnaire (Cigarette Smokers Only- No Cigars/Pipes/vapes)   Samuel Gibson   SDMV:01/11/2023 8:00 am [name]        May 06, 1955   LDCT: 01/18/2023 9:40 am GI    68 y.o.   Phone: [phone]   Lung Screening Narrative (confirm age 18-77 yrs Medicare / 50-80 yrs Private pay insurance)   Insurance information: [employer]   Referring Provider: Arlo[place], MD   This screening involves an initial phone call with a team member from our program. It is called a shared decision making visit. The initial meeting is required by  insurance and Medicare to make sure you understand the program. This appointment takes about 15-20 minutes to complete. You will complete the screening scan at your scheduled date/time.  This scan takes about 5-10 minutes to complete. You can eat and drink normally before and after the scan.  Criteria questions for Lung Cancer Screening:   Are you a current or former smoker? Current Age began smoking: 11   If you are a former smoker, what year did you quit smoking? Never quit (within 15 yrs)   To calculate your smoking history, I need an accurate estimate of how many packs of cigarettes you smoked per day and for how many years. (Not just the number of PPD you are now smoking)   Years smoking 57 x Packs per day 1.5 = Pack years 85.5   (at least 20 pack yrs)   (Make sure they understand that we need to know how much they have smoked in the past, not just the number of PPD they are smoking now)  Do you have a personal history of cancer?  No    Do you have a family history of cancer? Yes  (cancer type and and relative) Mother had bone cancer.   Are you coughing up blood?  No  Have you had unexplained weight loss of 15 lbs or more in the last 6 months? No  It looks like you meet all criteria.  When would be a good time for us  to schedule you for this screening?   Additional information: N/A

## 2024-01-11 ENCOUNTER — Ambulatory Visit

## 2024-01-11 ENCOUNTER — Encounter: Payer: Self-pay | Admitting: *Deleted

## 2024-01-11 DIAGNOSIS — F17210 Nicotine dependence, cigarettes, uncomplicated: Secondary | ICD-10-CM

## 2024-01-11 NOTE — Patient Instructions (Signed)

## 2024-01-11 NOTE — Progress Notes (Signed)
 Virtual Visit via Telephone Note  I connected with Samuel Gibson on 01/11/2024 at  8:00 AM EST by telephone and verified that I am speaking with the correct person using two identifiers.  Location: Patient: in home Provider: [address], [place], [place], [address]   Shared Decision Making Visit Lung Cancer Screening Program [phone])   Eligibility: Age 69 y.o. Pack Years Smoking History Calculation 85.5 (# packs/per year x # years smoked) Recent History of coughing up blood  no Unexplained weight loss? no ( >Than 15 pounds within the last 6 months ) Prior History Lung / other cancer no (Diagnosis within the last 5 years already requiring surveillance chest CT Scans). Smoking Status Current Smoker Former Smokers: Years since quit:  NA  Quit Date: NA  Visit Components: Discussion included one or more decision making aids. yes Discussion included risk/benefits of screening. yes Discussion included potential follow up diagnostic testing for abnormal scans. yes Discussion included meaning and risk of over diagnosis. yes Discussion included meaning and risk of False Positives. yes Discussion included meaning of total radiation exposure. yes  Counseling Included: Importance of adherence to annual lung cancer LDCT screening. yes Impact of comorbidities on ability to participate in the program. yes Ability and willingness to under diagnostic treatment. yes  Smoking Cessation Counseling: Current Smokers:  Discussed importance of smoking cessation. yes Information about tobacco cessation classes and interventions provided to patient. yes Patient provided with ticket for LDCT Scan. yes Symptomatic Patient. no  Counseling NA Diagnosis Code: Tobacco Use Z72.0 Asymptomatic Patient yes  Counseling (Intermediate counseling: > three minutes counseling) [id number]  Counseled patient 4 minutes regarding tobacco use.   Former Smokers:  Discussed the importance of maintaining cigarette  abstinence. yes Diagnosis Code: Personal History of Nicotine  Dependence. S12.108 Information about tobacco cessation classes and interventions provided to patient. Yes Patient provided with ticket for LDCT Scan. yes Written Order for Lung Cancer Screening with LDCT placed in Epic. Yes (CT Chest Lung Cancer Screening Low Dose W/O CM) [id number] Z12.2-Screening of respiratory organs Z87.891-Personal history of nicotine  dependence   [name] Ranger, RN 01/11/2024

## 2024-01-13 ENCOUNTER — Encounter: Payer: Self-pay | Admitting: Acute Care

## 2024-01-13 ENCOUNTER — Other Ambulatory Visit: Payer: Self-pay

## 2024-01-13 ENCOUNTER — Emergency Department (HOSPITAL_COMMUNITY)
Admission: EM | Admit: 2024-01-13 | Discharge: 2024-01-14 | Discharge status: Against Medical Advice | Attending: Emergency Medicine | Admitting: Emergency Medicine

## 2024-01-13 DIAGNOSIS — G8929 Other chronic pain: Secondary | ICD-10-CM | POA: Diagnosis not present

## 2024-01-13 DIAGNOSIS — M549 Dorsalgia, unspecified: Secondary | ICD-10-CM | POA: Insufficient documentation

## 2024-01-13 DIAGNOSIS — Z5321 Procedure and treatment not carried out due to patient leaving prior to being seen by health care provider: Secondary | ICD-10-CM | POA: Diagnosis not present

## 2024-01-13 NOTE — ED Triage Notes (Signed)
 Patient c/o back pain x 3 days. Patient report his been out of pain medication x 3 days. Patient denies any recent fall or injury. Hx Chronic back pain.

## 2024-01-14 NOTE — ED Notes (Signed)
 Pt called for vitals and no longer in waiting room.

## 2024-01-18 ENCOUNTER — Inpatient Hospital Stay
Admission: RE | Admit: 2024-01-18 | Discharge: 2024-01-18 | Disposition: A | Source: Ambulatory Visit | Attending: Acute Care | Admitting: Acute Care

## 2024-01-18 DIAGNOSIS — F17210 Nicotine dependence, cigarettes, uncomplicated: Secondary | ICD-10-CM

## 2024-01-18 DIAGNOSIS — Z87891 Personal history of nicotine dependence: Secondary | ICD-10-CM

## 2024-01-18 DIAGNOSIS — Z122 Encounter for screening for malignant neoplasm of respiratory organs: Secondary | ICD-10-CM

## 2024-01-26 ENCOUNTER — Other Ambulatory Visit: Payer: Self-pay

## 2024-01-26 DIAGNOSIS — F17210 Nicotine dependence, cigarettes, uncomplicated: Secondary | ICD-10-CM

## 2024-01-26 DIAGNOSIS — Z122 Encounter for screening for malignant neoplasm of respiratory organs: Secondary | ICD-10-CM

## 2024-01-26 DIAGNOSIS — Z87891 Personal history of nicotine dependence: Secondary | ICD-10-CM
